# Patient Record
Sex: Male | Born: 1945
Health system: Southern US, Community
[De-identification: ages and names within clinical notes are randomized; demographics above are authoritative.]

## PROBLEM LIST (undated history)

## (undated) DIAGNOSIS — I351 Nonrheumatic aortic (valve) insufficiency: Secondary | ICD-10-CM

## (undated) DIAGNOSIS — E785 Hyperlipidemia, unspecified: Secondary | ICD-10-CM

## (undated) DIAGNOSIS — I77810 Thoracic aortic ectasia: Secondary | ICD-10-CM

## (undated) DIAGNOSIS — G250 Essential tremor: Secondary | ICD-10-CM

## (undated) DIAGNOSIS — I10 Essential (primary) hypertension: Secondary | ICD-10-CM

## (undated) DIAGNOSIS — R011 Cardiac murmur, unspecified: Secondary | ICD-10-CM

## (undated) HISTORY — PX: COLONOSCOPY: SHX174

## (undated) HISTORY — DX: Cardiac murmur, unspecified: R01.1

## (undated) HISTORY — DX: Thoracic aortic ectasia: I77.810

## (undated) HISTORY — DX: Hyperlipidemia, unspecified: E78.5

## (undated) HISTORY — PX: TUBAL LIGATION: SHX77

## (undated) HISTORY — PX: OTHER SURGICAL HISTORY: SHX169

## (undated) HISTORY — DX: Nonrheumatic aortic (valve) insufficiency: I35.1

## (undated) HISTORY — DX: Essential tremor: G25.0

---

## 2010-06-05 ENCOUNTER — Encounter: Payer: Self-pay | Admitting: Family Medicine

## 2010-06-05 ENCOUNTER — Ambulatory Visit (INDEPENDENT_AMBULATORY_CARE_PROVIDER_SITE_OTHER): Payer: BC Managed Care – PPO | Admitting: Family Medicine

## 2010-06-05 DIAGNOSIS — E7800 Pure hypercholesterolemia, unspecified: Secondary | ICD-10-CM

## 2010-06-05 DIAGNOSIS — L570 Actinic keratosis: Secondary | ICD-10-CM | POA: Insufficient documentation

## 2010-06-10 NOTE — Assessment & Plan Note (Signed)
Summary: NEW PT TO EST....   Vital Signs:  Patient profile:   65 year old male Height:      73 inches Weight:      213.50 pounds BMI:     28.27 Temp:     98.3 degrees F oral Pulse rate:   76 / minute Pulse rhythm:   regular BP sitting:   120 / 76  (left arm) Cuff size:   regular  Vitals Entered By: Benny Lennert CMA Duncan Dull) (June 05, 2010 10:56 AM)  History of Present Illness: Chief complaint new patient to be established   65 year old male:  2 weeks go and went to get new d eye doctor.   AK: noted on exam, several on forehead / face Effudex:  Hyperlipidemia: h/o being on Pravachol 20 mg, which he tolerated well. No problems, no SE.  REVIEW OF SYSTEMS GEN: No acute illnesses, no fever, chills, sweats. CV: No chest pain or SOB GI: No noted N or V Otherwise, pertinent positives and negatives are noted in the HPI.   GEN: WDWN, NAD, Non-toxic, A & O x 3 HEENT: Atraumatic, Normocephalic. Neck supple. No masses, No LAD. Ears and Nose: No external deformity. CV: RRR, No M/G/R. No JVD. No thrill. No extra heart sounds. PULM: CTA B, no wheezes, crackles, rhonchi. No retractions. No resp. distress. No accessory muscle use. EXTR: No c/c/e NEURO: Normal gait.  PSYCH: Normally interactive. Conversant. Not depressed or anxious appearing.  Calm demeanor.     Preventive Screening-Counseling & Management  Caffeine-Diet-Exercise     Does Patient Exercise: yes      Drug Use:  yes.    Allergies (verified): No Known Drug Allergies  Past History:  Past medical, surgical, family and social histories (including risk factors) reviewed, and no changes noted (except as noted below).  Past Medical History: HYPERCHOLESTEROLEMIA (ICD-272.0)    Past Surgical History: none  Family History: Reviewed history and no changes required. alzheimers Family History High cholesterol Parent with stroke  Social History: Reviewed history and no changes required. Retired -  Airline pilot Married Alcohol use-yes Drug use-yes Regular exercise-yes Former smokerDrug Use:  yes Does Patient Exercise:  yes   Impression & Recommendations:  Problem # 1:  HYPERCHOLESTEROLEMIA (ICD-272.0) Assessment New restart meds, recheck levels at CPX  His updated medication list for this problem includes:    Pravachol 20 Mg Tabs (Pravastatin sodium) .Marland Kitchen... 1 by mouth at bedtime  Problem # 2:  ACTINIC KERATOSIS, HEAD (ICD-702.0) Assessment: New efudex x  weeks  Complete Medication List: 1)  Glucosamine-chondroitin 250-200 Mg Caps (Glucosamine-chondroitin) .... One tablet daily 2)  Caltrate 600 1500 Mg Tabs (Calcium carbonate) .... Take one tablet daily 3)  Fluorouracil 5 % Crea (Fluorouracil) .... Apply two times a day to the affected  (for 6 weeks) 4)  Pravachol 20 Mg Tabs (Pravastatin sodium) .Marland Kitchen.. 1 by mouth at bedtime  Patient Instructions: 1)  USE EFUDEX FOR 6 WEEKS 2)  f/u when your medicare is active for "Welcome to Medicare Physical" Prescriptions: PRAVACHOL 20 MG TABS (PRAVASTATIN SODIUM) 1 by mouth at bedtime  #90 x 3   Entered and Authorized by:   Hannah Beat MD   Signed by:   Hannah Beat MD on 06/05/2010   Method used:   Print then Give to Patient   RxID:   [id number] FLUOROURACIL 5 % CREA (FLUOROURACIL) Apply two times a day to the affected  (for 6 weeks)  #1 tube x 1   Entered and Authorized  by:   Hannah Beat MD   Signed by:   Hannah Beat MD on 06/05/2010   Method used:   Print then Give to Patient   RxID:   [id number]    Orders Added: 1)  New Patient Level III [56213]    Current Allergies (reviewed today): No known allergies

## 2010-08-14 ENCOUNTER — Encounter: Payer: Self-pay | Admitting: Family Medicine

## 2010-10-23 ENCOUNTER — Other Ambulatory Visit (INDEPENDENT_AMBULATORY_CARE_PROVIDER_SITE_OTHER): Payer: Medicare Other | Admitting: Family Medicine

## 2010-10-23 DIAGNOSIS — Z79899 Other long term (current) drug therapy: Secondary | ICD-10-CM

## 2010-10-23 DIAGNOSIS — Z125 Encounter for screening for malignant neoplasm of prostate: Secondary | ICD-10-CM

## 2010-10-23 DIAGNOSIS — E785 Hyperlipidemia, unspecified: Secondary | ICD-10-CM

## 2010-10-23 LAB — CBC WITH DIFFERENTIAL/PLATELET
Basophils Absolute: 0 10*3/uL (ref 0.0–0.1)
Basophils Relative: 0.5 % (ref 0.0–3.0)
Eosinophils Absolute: 0.4 10*3/uL (ref 0.0–0.7)
Lymphocytes Relative: 27 % (ref 12.0–46.0)
MCHC: 32.8 g/dL (ref 30.0–36.0)
Monocytes Relative: 7.8 % (ref 3.0–12.0)
Neutrophils Relative %: 58.1 % (ref 43.0–77.0)
RBC: 4.37 Mil/uL (ref 4.22–5.81)

## 2010-10-23 LAB — BASIC METABOLIC PANEL
BUN: 13 mg/dL (ref 6–23)
CO2: 28 mEq/L (ref 19–32)
Chloride: 104 mEq/L (ref 96–112)
Creatinine, Ser: 1 mg/dL (ref 0.4–1.5)

## 2010-10-23 LAB — HEPATIC FUNCTION PANEL
Alkaline Phosphatase: 60 U/L (ref 39–117)
Bilirubin, Direct: 0.1 mg/dL (ref 0.0–0.3)
Total Protein: 7.3 g/dL (ref 6.0–8.3)

## 2010-10-23 LAB — LIPID PANEL
HDL: 39.7 mg/dL (ref >39.00)
Total CHOL/HDL Ratio: 5
VLDL: 22.2 mg/dL (ref 0.0–40.0)

## 2010-10-23 LAB — LDL CHOLESTEROL, DIRECT: Direct LDL: 139.1 mg/dL

## 2010-10-28 ENCOUNTER — Encounter: Payer: Self-pay | Admitting: Family Medicine

## 2010-10-31 ENCOUNTER — Ambulatory Visit (INDEPENDENT_AMBULATORY_CARE_PROVIDER_SITE_OTHER): Payer: Medicare Other | Admitting: Family Medicine

## 2010-10-31 ENCOUNTER — Encounter: Payer: Self-pay | Admitting: Family Medicine

## 2010-10-31 VITALS — BP 130/70 | HR 66 | Temp 97.9°F | Ht 73.0 in | Wt 208.1 lb

## 2010-10-31 DIAGNOSIS — E7800 Pure hypercholesterolemia, unspecified: Secondary | ICD-10-CM

## 2010-10-31 DIAGNOSIS — Z0000 Encounter for general adult medical examination without abnormal findings: Secondary | ICD-10-CM

## 2010-10-31 NOTE — Progress Notes (Signed)
Subjective:    Patient ID: Corey Williams, male    DOB: Jul 08, 1945, 65 y.o.   MRN: [id number]  HPI  Corey Williams, a 65 y.o. male presents today in the office for the following:  Medicare CPX and f/u medical problems.  Has a familial tremor. Brother has it really bad. Problem is most with her left hand. People have been notcinging it. Has been ongoing for about four years.   I have personally reviewed the Medicare Annual Wellness questionnaire and have noted 1. The patient's medical and social history 2. Their use of alcohol, tobacco or illicit drugs 3. Their current medications and supplements 4. The patient's functional ability including ADL's, fall risks, home safety risks and hearing or visual             impairment. 5. Diet and physical activities 6. Evidence for depression or mood disorders  The patients weight, height, BMI and visual acuity have been recorded in the chart I have made referrals, counseling and provided education to the patient based review of the above and I have provided the pt with a written personalized care plan for preventive services.  I have provided the patient with a copy of your personalized plan for preventive services. Instructed to take the time to review along with their updated medication list.  Lipids: Doing well, stable. Tolerating meds fine with no SE. Panel reviewed with patient.  Lipids:    Component Value Date/Time   CHOL 212* 10/23/2010 0929   TRIG 111.0 10/23/2010 0929   HDL 39.70 10/23/2010 0929   LDLDIRECT 139.1 10/23/2010 0929   VLDL 22.2 10/23/2010 0929   CHOLHDL 5 10/23/2010 0929    Lab Results  Component Value Date   ALT 19 10/23/2010   AST 18 10/23/2010   ALKPHOS 60 10/23/2010   BILITOT 1.1 10/23/2010   Preventative Health Maintenance Visit:  Health Maintenance Summary Reviewed and updated, unless pt declines services.  Tobacco History Reviewed. Alcohol: No concerns, no excessive use Exercise Habits: Some activity, rec at least 30 mins  5 times a week, walking or playing golf 5-6 days a week Drug Use: None Encouraged self-testicular check  Health Maintenance  Topic Date Due  . Tetanus/tdap  10/22/1964  . Colonoscopy  10/23/1995  . Zostavax  10/22/2005  . Pneumococcal Polysaccharide Vaccine Age 54 And Over  10/23/2010  . Influenza Vaccine  12/22/2010    Labs reviewed with the patient.   Lipids:    Component Value Date/Time   CHOL 212* 10/23/2010 0929   TRIG 111.0 10/23/2010 0929   HDL 39.70 10/23/2010 0929   LDLDIRECT 139.1 10/23/2010 0929   VLDL 22.2 10/23/2010 0929   CHOLHDL 5 10/23/2010 0929    CBC:    Component Value Date/Time   WBC 6.2 10/23/2010 0929   HGB 13.6 10/23/2010 0929   HCT 41.5 10/23/2010 0929   PLT 246.0 10/23/2010 0929   MCV 94.9 10/23/2010 0929   NEUTROABS 3.6 10/23/2010 0929   LYMPHSABS 1.7 10/23/2010 0929   MONOABS 0.5 10/23/2010 0929   EOSABS 0.4 10/23/2010 0929   BASOSABS 0.0 10/23/2010 0929    Basic Metabolic Panel:    Component Value Date/Time   NA 141 10/23/2010 0929   K 4.6 10/23/2010 0929   CL 104 10/23/2010 0929   CO2 28 10/23/2010 0929   BUN 13 10/23/2010 0929   CREATININE 1.0 10/23/2010 0929   GLUCOSE 93 10/23/2010 0929   CALCIUM 9.3 10/23/2010 0929    Lab Results  Component Value  Date   ALT 19 10/23/2010   AST 18 10/23/2010   ALKPHOS 60 10/23/2010   BILITOT 1.1 10/23/2010    Lab Results  Component Value Date   PSA 0.87 10/23/2010   The PMH, PSH, Social History, Family History, Medications, and allergies have been reviewed in [facility], and have been updated if relevant.  Review of Systems  General: Denies fever, chills, sweats. No significant weight loss. Eyes: Denies blurring,significant itching ENT: Denies earache, sore throat, and hoarseness. Cardiovascular: Denies chest pains, palpitations, dyspnea on exertion Respiratory: Denies cough, dyspnea at rest,wheeezing Breast: no concerns about lumps GI: Denies nausea, vomiting, diarrhea, constipation, change in bowel habits, abdominal pain, melena, hematochezia GU:  Denies penile discharge, ED, urinary flow / outflow problems. No STD concerns. Musculoskeletal: Denies back pain, joint pain Derm: Denies rash, itching Neuro: Denies  paresthesias, frequent falls, frequent headaches Psych: Denies depression, anxiety Endocrine: Denies cold intolerance, heat intolerance, polydipsia Heme: Denies enlarged lymph nodes Allergy: No hayfever     Objective:   Physical Exam   Physical Exam  Blood pressure 130/70, pulse 66, temperature 97.9 F (36.6 C), temperature source Oral, height 6\' 1"  (1.854 m), weight 208 lb 1.9 oz (94.403 kg), SpO2 98.00%.  PE: GEN: well developed, well nourished, no acute distress Eyes: conjunctiva and lids normal, PERRLA, EOMI ENT: TM clear, nares clear, oral exam WNL Neck: supple, no lymphadenopathy, no thyromegaly, no JVD Pulm: clear to auscultation and percussion, respiratory effort normal CV: regular rate and rhythm, S1-S2, no murmur, rub or gallop, no bruits, peripheral pulses normal and symmetric, no cyanosis, clubbing, edema or varicosities Chest: no scars, masses, no gynecomastia   GI: soft, non-tender; no hepatosplenomegaly, masses; active bowel sounds all quadrants GU: no hernia, testicular mass, penile discharge, priapism or prostate enlargement Lymph: no cervical, axillary or inguinal adenopathy MSK: gait normal, muscle tone and strength WNL, no joint swelling, effusions, discoloration, crepitus  SKIN: clear, good turgor, color WNL, no rashes, lesions, or ulcerations Neuro: normal mental status, normal strength, sensation, and motion Psych: alert; oriented to person, place and time, normally interactive and not anxious or depressed in appearance.     Assessment & Plan:   1. Routine general medical examination at a health care facility   2. HYPERCHOLESTEROLEMIA    The patient's preventative maintenance and recommended screening tests for an annual wellness exam were reviewed in full today. Brought up to date unless  services declined.  Counselled on the importance of diet, exercise, and its role in overall health and mortality. The patient's FH and SH was reviewed, including their home life, tobacco status, and drug and alcohol status.  Declines multiple: Zostavax, pneumovax, flu vaccine. Colonoscopy at this time -- he will check with his insurance and let us know if he is interested.

## 2011-07-29 ENCOUNTER — Other Ambulatory Visit: Payer: Self-pay | Admitting: *Deleted

## 2011-07-29 MED ORDER — PRAVASTATIN SODIUM 20 MG PO TABS: 20.0000 mg | ORAL_TABLET | Freq: Every day | ORAL | Status: DC

## 2011-11-06 ENCOUNTER — Other Ambulatory Visit: Payer: Self-pay | Admitting: Family Medicine

## 2011-11-06 DIAGNOSIS — R5381 Other malaise: Secondary | ICD-10-CM

## 2011-11-06 DIAGNOSIS — E785 Hyperlipidemia, unspecified: Secondary | ICD-10-CM

## 2011-11-06 DIAGNOSIS — Z125 Encounter for screening for malignant neoplasm of prostate: Secondary | ICD-10-CM

## 2011-11-09 ENCOUNTER — Other Ambulatory Visit (INDEPENDENT_AMBULATORY_CARE_PROVIDER_SITE_OTHER): Payer: Medicare Other

## 2011-11-09 DIAGNOSIS — Z125 Encounter for screening for malignant neoplasm of prostate: Secondary | ICD-10-CM

## 2011-11-09 DIAGNOSIS — R5381 Other malaise: Secondary | ICD-10-CM

## 2011-11-09 DIAGNOSIS — E785 Hyperlipidemia, unspecified: Secondary | ICD-10-CM

## 2011-11-09 DIAGNOSIS — R5383 Other fatigue: Secondary | ICD-10-CM

## 2011-11-09 LAB — CBC WITH DIFFERENTIAL/PLATELET
Basophils Relative: 0.6 % (ref 0.0–3.0)
Eosinophils Relative: 6.6 % — ABNORMAL HIGH (ref 0.0–5.0)
HCT: 40 % (ref 39.0–52.0)
Lymphs Abs: 1.6 10*3/uL (ref 0.7–4.0)
MCV: 95.5 fl (ref 78.0–100.0)
Monocytes Absolute: 0.6 10*3/uL (ref 0.1–1.0)
Monocytes Relative: 9.3 % (ref 3.0–12.0)
RBC: 4.18 Mil/uL — ABNORMAL LOW (ref 4.22–5.81)
WBC: 6 10*3/uL (ref 4.5–10.5)

## 2011-11-09 LAB — LIPID PANEL
LDL Cholesterol: 124 mg/dL — ABNORMAL HIGH (ref 0–99)
Total CHOL/HDL Ratio: 5
Triglycerides: 72 mg/dL (ref 0.0–149.0)
VLDL: 14.4 mg/dL (ref 0.0–40.0)

## 2011-11-09 LAB — BASIC METABOLIC PANEL
CO2: 29 mEq/L (ref 19–32)
Chloride: 105 mEq/L (ref 96–112)
Creatinine, Ser: 0.9 mg/dL (ref 0.4–1.5)
Potassium: 4.6 mEq/L (ref 3.5–5.1)

## 2011-11-09 LAB — HEPATIC FUNCTION PANEL
Albumin: 4 g/dL (ref 3.5–5.2)
Alkaline Phosphatase: 61 U/L (ref 39–117)
Bilirubin, Direct: 0.2 mg/dL (ref 0.0–0.3)

## 2011-11-16 ENCOUNTER — Encounter: Payer: Self-pay | Admitting: Family Medicine

## 2011-11-16 ENCOUNTER — Ambulatory Visit (INDEPENDENT_AMBULATORY_CARE_PROVIDER_SITE_OTHER): Payer: Medicare Other | Admitting: Family Medicine

## 2011-11-16 VITALS — BP 148/68 | HR 68 | Temp 97.5°F | Ht 73.0 in | Wt 211.0 lb

## 2011-11-16 DIAGNOSIS — Z0000 Encounter for general adult medical examination without abnormal findings: Secondary | ICD-10-CM

## 2011-11-16 DIAGNOSIS — L570 Actinic keratosis: Secondary | ICD-10-CM

## 2011-11-16 MED ORDER — FLUOROURACIL 5 % EX CREA: TOPICAL_CREAM | Freq: Two times a day (BID) | CUTANEOUS | Status: AC

## 2011-11-16 NOTE — Progress Notes (Signed)
Nature conservation officer at [facility] [address] [place] [address] Phone: [phone] Fax: [phone]  Date:  11/16/2011   Name:  Corey Williams   DOB:  09-01-1945   MRN:  [id number] Gender: male Age: 66 y.o.  PCP:  Hannah Beat, MD    Chief Complaint: Annual Exam   History of Present Illness:  Corey Williams is a 66 y.o. pleasant patient who presents with the following:  Back of ear, dead skin? Umbilical hernia.   Preventative Health Maintenance Visit:  Health Maintenance Summary Reviewed and updated, unless pt declines services.  Tobacco History Reviewed. Alcohol: No concerns, no excessive use about 3 beers a week Exercise Habits: walks 3 miles a day STD concerns: no risk or activity to increase risk Drug Use: None Encouraged self-testicular check  Health Maintenance  Topic Date Due  . Tetanus/tdap  10/22/1964  . Influenza Vaccine  12/22/2011  . Colonoscopy  10/30/2020  . Pneumococcal Polysaccharide Vaccine Age 66 And Over  Addressed  . Zostavax  Addressed    Labs reviewed with the patient.  Results for orders placed in visit on 11/09/11  CBC WITH DIFFERENTIAL      Component Value Range   WBC 6.0  4.5 - 10.5 K/uL   RBC 4.18 (*) 4.22 - 5.81 Mil/uL   Hemoglobin 13.0  13.0 - 17.0 g/dL   HCT 01.0  27.2 - 53.6 %   MCV 95.5  78.0 - 100.0 fl   MCHC 32.6  30.0 - 36.0 g/dL   RDW 64.4  03.4 - 74.2 %   Platelets 226.0  150.0 - 400.0 K/uL   Neutrophils Relative 56.1  43.0 - 77.0 %   Lymphocytes Relative 27.4  12.0 - 46.0 %   Monocytes Relative 9.3  3.0 - 12.0 %   Eosinophils Relative 6.6 (*) 0.0 - 5.0 %   Basophils Relative 0.6  0.0 - 3.0 %   Neutro Abs 3.4  1.4 - 7.7 K/uL   Lymphs Abs 1.6  0.7 - 4.0 K/uL   Monocytes Absolute 0.6  0.1 - 1.0 K/uL   Eosinophils Absolute 0.4  0.0 - 0.7 K/uL   Basophils Absolute 0.0  0.0 - 0.1 K/uL  LIPID PANEL      Component Value Range   Cholesterol 178  0 - 200 mg/dL   Triglycerides 59.5  0.0 - 149.0 mg/dL   HDL 63.87   >56.43 mg/dL   VLDL 32.9  0.0 - 51.8 mg/dL   LDL Cholesterol 841 (*) 0 - 99 mg/dL   Total CHOL/HDL Ratio 5    HEPATIC FUNCTION PANEL      Component Value Range   Total Bilirubin 1.0  0.3 - 1.2 mg/dL   Bilirubin, Direct 0.2  0.0 - 0.3 mg/dL   Alkaline Phosphatase 61  39 - 117 U/L   AST 20  0 - 37 U/L   ALT 15  0 - 53 U/L   Total Protein 7.3  6.0 - 8.3 g/dL   Albumin 4.0  3.5 - 5.2 g/dL  BASIC METABOLIC PANEL      Component Value Range   Sodium 140  135 - 145 mEq/L   Potassium 4.6  3.5 - 5.1 mEq/L   Chloride 105  96 - 112 mEq/L   CO2 29  19 - 32 mEq/L   Glucose, Bld 83  70 - 99 mg/dL   BUN 14  6 - 23 mg/dL   Creatinine, Ser 0.9  0.4 - 1.5 mg/dL  Calcium 9.3  8.4 - 10.5 mg/dL   GFR 16.10  >96.04 mL/min  PSA, MEDICARE      Component Value Range   PSA 0.69  0.10 - 4.00 ng/ml     Patient Active Problem List  Diagnosis  . HYPERCHOLESTEROLEMIA  . ACTINIC KERATOSIS, HEAD    Past Medical History  Diagnosis Date  . Hyperlipidemia     No past surgical history on file.  History  Substance Use Topics  . Smoking status: Former Games developer  . Smokeless tobacco: Never Used  . Alcohol Use: Yes    No family history on file.  No Known Allergies  Medication list has been reviewed and updated.  Current Outpatient Prescriptions on File Prior to Visit  Medication Sig Dispense Refill  . aspirin 81 MG tablet Take 81 mg by mouth daily.        . Multiple Vitamin (MULTIVITAMIN) tablet Take 1 tablet by mouth daily.        . pravastatin (PRAVACHOL) 20 MG tablet Take 1 tablet (20 mg total) by mouth daily.  90 tablet  1    Review of Systems:   General: Denies fever, chills, sweats. No significant weight loss. Eyes: Denies blurring,significant itching ENT: Denies earache, sore throat, and hoarseness. Cardiovascular: Denies chest pains, palpitations, dyspnea on exertion Respiratory: Denies cough, dyspnea at rest,wheeezing Breast: no concerns about lumps GI: Denies nausea, vomiting,  diarrhea, constipation, change in bowel habits, abdominal pain, melena, hematochezia GU: Denies penile discharge, ED, urinary flow / outflow problems. No STD concerns. Musculoskeletal: Denies back pain, joint pain Derm: above Neuro: Denies  paresthesias, frequent falls, frequent headaches Psych: Denies depression, anxiety Endocrine: Denies cold intolerance, heat intolerance, polydipsia Heme: Denies enlarged lymph nodes Allergy: No hayfever   Physical Examination: Filed Vitals:   11/16/11 1448  BP: 148/68  Pulse: 68  Temp: 97.5 F (36.4 C)   Filed Vitals:   11/16/11 1448  Height: 6\' 1"  (1.854 m)  Weight: 211 lb (95.709 kg)   Body mass index is 27.84 kg/(m^2). Ideal Body Weight: Weight in (lb) to have BMI = 25: 189.1    Wt Readings from Last 3 Encounters:  11/16/11 211 lb (95.709 kg)  10/31/10 208 lb 1.9 oz (94.403 kg)  06/05/10 213 lb 8 oz (96.843 kg)    GEN: well developed, well nourished, no acute distress Eyes: conjunctiva and lids normal, PERRLA, EOMI ENT: TM clear, nares clear, oral exam WNL Neck: supple, no lymphadenopathy, no thyromegaly, no JVD Pulm: clear to auscultation and percussion, respiratory effort normal CV: regular rate and rhythm, S1-S2, no murmur, rub or gallop, no bruits, peripheral pulses normal and symmetric, no cyanosis, clubbing, edema or varicosities Chest: no scars, masses GI: soft, non-tender; no hepatosplenomegaly, masses; active bowel sounds all quadrants, umbilical hernia GU: no hernia, testicular mass, penile discharge, or prostate enlargement Lymph: no cervical, axillary or inguinal adenopathy MSK: gait normal, muscle tone and strength WNL, no joint swelling, effusions, discoloration, crepitus  SKIN: lesion on L top of ear, scaly, slightly pearly Neuro: normal mental status, normal strength, sensation, and motion Psych: alert; oriented to person, place and time, normally interactive and not anxious or depressed in  appearance.   Assessment and Plan:  1. Routine general medical examination at a health care facility   2. Actinic keratosis    The patient's preventative maintenance and recommended screening tests for an annual wellness exam were reviewed in full today. Brought up to date unless services declined.  Counselled on the importance of diet,  exercise, and its role in overall health and mortality. The patient's FH and SH was reviewed, including their home life, tobacco status, and drug and alcohol status.    I have personally reviewed the Medicare Annual Wellness questionnaire and have noted 1. The patient's medical and social history 2. Their use of alcohol, tobacco or illicit drugs 3. Their current medications and supplements 4. The patient's functional ability including ADL's, fall risks, home safety risks and hearing or visual             impairment. 5. Diet and physical activities 6. Evidence for depression or mood disorders  The patients weight, height, BMI and visual acuity have been recorded in the chart I have made referrals, counseling and provided education to the patient based review of the above and I have provided the pt with a written personalized care plan for preventive services.  I have provided the patient with a copy of your personalized plan for preventive services. Instructed to take the time to review along with their updated medication list.   Orders Today:  No orders of the defined types were placed in this encounter.    Medications Today: (Includes new updates added during medication reconciliation) Meds ordered this encounter  Medications  . Calcium Carbonate (CALCIUM 600 PO)    Sig: Take 1 tablet by mouth daily.  . Glucosamine 750 MG TABS    Sig: Take 2 tablets by mouth daily.  . fluorouracil (EFUDEX) 5 % cream    Sig: Apply topically 2 (two) times daily.    Dispense:  40 g    Refill:  1    Medications Discontinued: Medications Discontinued During  This Encounter  Medication Reason  . Calcium Carbonate (CALTRATE 600) 1500 MG TABS Change in therapy  . Glucosamine-Chondroitin (GLUCOSAMINE CHONDR COMPLEX) 500-400 MG CAPS Change in therapy     Hannah Beat, MD

## 2012-01-25 ENCOUNTER — Encounter: Payer: Self-pay | Admitting: Gastroenterology

## 2012-01-25 ENCOUNTER — Telehealth: Payer: Self-pay

## 2012-01-25 DIAGNOSIS — Z1211 Encounter for screening for malignant neoplasm of colon: Secondary | ICD-10-CM

## 2012-01-25 NOTE — Telephone Encounter (Signed)
Pt request colonoscopy referral; pt said Dr Patsy Lager had recommended pt to have colonoscopy.Please advise.

## 2012-01-25 NOTE — Telephone Encounter (Signed)
done

## 2012-02-02 ENCOUNTER — Ambulatory Visit (AMBULATORY_SURGERY_CENTER): Payer: Medicare Other | Admitting: *Deleted

## 2012-02-02 VITALS — Ht 73.0 in | Wt 220.9 lb

## 2012-02-02 DIAGNOSIS — Z1211 Encounter for screening for malignant neoplasm of colon: Secondary | ICD-10-CM

## 2012-02-02 MED ORDER — MOVIPREP 100 G PO SOLR: ORAL | Status: DC

## 2012-02-15 ENCOUNTER — Ambulatory Visit (AMBULATORY_SURGERY_CENTER): Payer: Medicare Other | Admitting: Gastroenterology

## 2012-02-15 ENCOUNTER — Encounter: Payer: Self-pay | Admitting: Gastroenterology

## 2012-02-15 VITALS — BP 132/91 | HR 59 | Temp 96.7°F | Resp 14 | Ht 73.0 in | Wt 220.0 lb

## 2012-02-15 DIAGNOSIS — Z1211 Encounter for screening for malignant neoplasm of colon: Secondary | ICD-10-CM

## 2012-02-15 MED ORDER — SODIUM CHLORIDE 0.9 % IV SOLN: 500.0000 mL | INTRAVENOUS | Status: DC

## 2012-02-15 NOTE — Progress Notes (Signed)
1438 a/ox3 pleased with MAC, report to [facility]

## 2012-02-15 NOTE — Op Note (Signed)
[facility] [address] [place], [address]   COLONOSCOPY PROCEDURE REPORT  PATIENT: Corey Williams, Corey Williams  MR#: [id number] BIRTHDATE: [birth year] , 66  yrs. old GENDER: Male ENDOSCOPIST: Mardella Layman, MD, [facility] REFERRED BY: PROCEDURE DATE:  02/15/2012 PROCEDURE:   Colonoscopy, screening ASA CLASS:   Class I INDICATIONS:Average risk patient for colon cancer. MEDICATIONS: propofol (Diprivan) 200mg  IV  DESCRIPTION OF PROCEDURE:   After the risks and benefits and of the procedure were explained, informed consent was obtained.  A digital rectal exam revealed no abnormalities of the rectum.    The LB [id number]  endoscope was introduced through the anus and advanced to the cecum, which was identified by both the appendix and ileocecal valve .  The quality of the prep was good, using MoviPrep .  The instrument was then slowly withdrawn as the colon was fully examined.     COLON FINDINGS: Diverticulosis was noted in the descending colon and sigmoid colon.     Retroflexed views revealed no abnormalities. The scope was then withdrawn from the patient and the procedure completed.  COMPLICATIONS: There were no complications. ENDOSCOPIC IMPRESSION: Diverticulosis was noted in the descending colon and sigmoid colon  RECOMMENDATIONS: 1.  High fiber diet 2.  You should continue to follow colorectal cancer screening guidelines for "routine risk" patients with a repeat colonoscopy in 10 years.  There is no need for FOBT (stool) testing for at least 5 years.   REPEAT EXAM:  cc:  _______________________________ eSignedMardella Layman, MD, [facility] 02/15/2012 2:38 PM

## 2012-02-15 NOTE — Patient Instructions (Signed)
YOU HAD AN ENDOSCOPIC PROCEDURE TODAY AT THE [facility]: Refer to the procedure report that was given to you for any specific questions about what was found during the examination.  If the procedure report does not answer your questions, please call your gastroenterologist to clarify.  If you requested that your care partner not be given the details of your procedure findings, then the procedure report has been included in a sealed envelope for you to review at your convenience later.  YOU SHOULD EXPECT: Some feelings of bloating in the abdomen. Passage of more gas than usual.  Walking can help get rid of the air that was put into your GI tract during the procedure and reduce the bloating. If you had a lower endoscopy (such as a colonoscopy or flexible sigmoidoscopy) you may notice spotting of blood in your stool or on the toilet paper. If you underwent a bowel prep for your procedure, then you may not have a normal bowel movement for a few days.  DIET: Your first meal following the procedure should be a light meal and then it is ok to progress to your normal diet.  A half-sandwich or bowl of soup is an example of a good first meal.  Heavy or fried foods are harder to digest and may make you feel nauseous or bloated.  Likewise meals heavy in dairy and vegetables can cause extra gas to form and this can also increase the bloating.  Drink plenty of fluids but you should avoid alcoholic beverages for 24 hours.  ACTIVITY: Your care partner should take you home directly after the procedure.  You should plan to take it easy, moving slowly for the rest of the day.  You can resume normal activity the day after the procedure however you should NOT DRIVE or use heavy machinery for 24 hours (because of the sedation medicines used during the test).    SYMPTOMS TO REPORT IMMEDIATELY: A gastroenterologist can be reached at any hour.  During normal business hours, 8:30 AM to 5:00 PM Monday through Friday,  call [phone].  After hours and on weekends, please call the GI answering service at [phone] who will take a message and have the physician on call contact you.   Following lower endoscopy (colonoscopy or flexible sigmoidoscopy):  Excessive amounts of blood in the stool  Significant tenderness or worsening of abdominal pains  Swelling of the abdomen that is new, acute  Fever of 100F or higher   FOLLOW UP: If any biopsies were taken you will be contacted by phone or by letter within the next 1-3 weeks.  Call your gastroenterologist if you have not heard about the biopsies in 3 weeks.  Our staff will call the home number listed on your records the next business day following your procedure to check on you and address any questions or concerns that you may have at that time regarding the information given to you following your procedure. This is a courtesy call and so if there is no answer at the home number and we have not heard from you through the emergency physician on call, we will assume that you have returned to your regular daily activities without incident.  SIGNATURES/CONFIDENTIALITY: You and/or your care partner have signed paperwork which will be entered into your electronic medical record.  These signatures attest to the fact that that the information above on your After Visit Summary has been reviewed and is understood.  Full responsibility of the confidentiality of   this discharge information lies with you and/or your care-partner.    INFORMATION ON DIVERTICULOSIS & HIGH FIBER DIET GIVEN TO YOU TODAY 

## 2012-02-15 NOTE — Progress Notes (Signed)
Patient did not experience any of the following events: a burn prior to discharge; a fall within the facility; wrong site/side/patient/procedure/implant event; or a hospital transfer or hospital admission upon discharge from the facility. (G8907) Patient did not have preoperative order for IV antibiotic SSI prophylaxis. (G8918)  

## 2012-02-16 ENCOUNTER — Telehealth: Payer: Self-pay

## 2012-02-16 NOTE — Telephone Encounter (Signed)
  Follow up Call-  Call back number 02/15/2012  Post procedure Call Back phone  # [phone]  Permission to leave phone message Yes     Patient questions:  Do you have a fever, pain , or abdominal swelling? no Pain Score  0 *  Have you tolerated food without any problems? yes  Have you been able to return to your normal activities? yes  Do you have any questions about your discharge instructions: Diet   no Medications  no Follow up visit  no  Do you have questions or concerns about your Care? no  Actions: * If pain score is 4 or above: No action needed, pain <4.

## 2012-02-25 ENCOUNTER — Other Ambulatory Visit: Payer: Self-pay | Admitting: Family Medicine

## 2012-04-13 ENCOUNTER — Ambulatory Visit (INDEPENDENT_AMBULATORY_CARE_PROVIDER_SITE_OTHER)
Admission: RE | Admit: 2012-04-13 | Discharge: 2012-04-13 | Disposition: A | Discharge status: Home / Self Care | Payer: Medicare Other | Source: Ambulatory Visit | Attending: Family Medicine | Admitting: Family Medicine

## 2012-04-13 ENCOUNTER — Ambulatory Visit (INDEPENDENT_AMBULATORY_CARE_PROVIDER_SITE_OTHER): Payer: Medicare Other | Admitting: Family Medicine

## 2012-04-13 ENCOUNTER — Encounter: Payer: Self-pay | Admitting: Family Medicine

## 2012-04-13 VITALS — BP 140/92 | HR 64 | Temp 98.0°F | Ht 73.0 in | Wt 216.8 lb

## 2012-04-13 DIAGNOSIS — M79659 Pain in unspecified thigh: Secondary | ICD-10-CM

## 2012-04-13 DIAGNOSIS — M79609 Pain in unspecified limb: Secondary | ICD-10-CM

## 2012-04-13 MED ORDER — CYCLOBENZAPRINE HCL 10 MG PO TABS: 10.0000 mg | ORAL_TABLET | Freq: Every day | ORAL | Status: DC

## 2012-04-13 NOTE — Progress Notes (Signed)
Nature conservation officer at [facility] [address] [place] [address] Phone: [phone] Fax: [phone]  Date:  04/13/2012   Name:  Corey Williams   DOB:  January 20, 1946   MRN:  [id number] Gender: male Age: 67 y.o.  Primary Physician:  Hannah Beat, MD  Evaluating MD: Hannah Beat, MD   Chief Complaint: Leg Pain   History of Present Illness:  KAYVON MO is a 67 y.o. pleasant patient who presents with the following:  Leg pain:  Walks on most days, then Friday, pain in the left distal thigh.  Presents with 5 days history of distal anterior thigh pain without any known preceding injury. Stopped his statin over the weekend, but this has not helped. Some difficulty sleeping at night, as well.  Patient Active Problem List  Diagnosis  . HYPERCHOLESTEROLEMIA  . ACTINIC KERATOSIS, HEAD    Past Medical History  Diagnosis Date  . Hyperlipidemia     Past Surgical History  Procedure Date  . No prior surgery     History  Substance Use Topics  . Smoking status: Former Smoker    Quit date: 02/01/1974  . Smokeless tobacco: Never Used  . Alcohol Use: 3.0 oz/week    5 Cans of beer per week    Family History  Problem Relation Age of Onset  . Colon cancer Neg Hx   . Stomach cancer Neg Hx     No Known Allergies  Medication list has been reviewed and updated.  Outpatient Prescriptions Prior to Visit  Medication Sig Dispense Refill  . aspirin 81 MG tablet Take 81 mg by mouth daily.        . Calcium Carbonate (CALCIUM 600 PO) Take 1 tablet by mouth daily.      . fluorouracil (EFUDEX) 5 % cream Apply topically 2 (two) times daily.  40 g  1  . Glucosamine 750 MG TABS Take 2 tablets by mouth daily.      . Multiple Vitamin (MULTIVITAMIN) tablet Take 1 tablet by mouth daily.        . pravastatin (PRAVACHOL) 20 MG tablet TAKE ONE TABLET BY MOUTH EVERY DAY  90 tablet  0   Last reviewed on 04/13/2012  8:03 AM by Hannah Beat, MD  Review of Systems:   GEN: No  acute illnesses, no fevers, chills. GI: No n/v/d, eating normally Pulm: No SOB Interactive and getting along well at home.  Otherwise, ROS is as per the HPI.   Physical Examination: BP 140/92  Pulse 64  Temp 98 F (36.7 C) (Oral)  Ht 6\' 1"  (1.854 m)  Wt 216 lb 12 oz (98.317 kg)  BMI 28.60 kg/m2  SpO2 97%  Ideal Body Weight: Weight in (lb) to have BMI = 25: 189.1    GEN: WDWN, NAD, Non-toxic, Alert & Oriented x 3 HEENT: Atraumatic, Normocephalic.  Ears and Nose: No external deformity. EXTR: No clubbing/cyanosis/edema NEURO: Normal gait.  PSYCH: Normally interactive. Conversant. Not depressed or anxious appearing.  Calm demeanor.   Knee:  B Gait: Normal heel toe pattern ROM: full Effusion: neg Echymosis or edema: none Patellar tendon NT Painful PLICA: neg Patellar grind: negative Medial and lateral patellar facet loading: negative medial and lateral joint lines:NT Mcmurray's neg Flexion-pinch neg Varus and valgus stress: stable Lachman: neg Ant and Post drawer: neg Hip abduction, IR, ER: WNL Hip flexion str: 5/5 Hip abd: 5/5 Quad: 5/5 VMO atrophy:No Hamstring concentric and eccentric: 5/5   Assessment and Plan:  1. Thigh pain  DG Femur Left   Knee and thigh exam relatively benign. No occult injury. More likely muscular given location, maybe from overuse from walking. Check thigh film to ensure normal distal femur. Flexeril at night and 600 mg Ibuprofen q 6 hours  [facility] knee rehab reviewed  Orders Today:  Orders Placed This Encounter  Procedures  . DG Femur Left    Standing Status: Future     Number of Occurrences: 1     Standing Expiration Date: 06/11/2013    Order Specific Question:  Reason for exam:    Answer:  thigh pain distally    Order Specific Question:  Preferred imaging location?    Answer:  [facility]    Updated Medication List: (Includes new medications, updates to list, dose adjustments) Meds ordered this encounter  Medications   . cyclobenzaprine (FLEXERIL) 10 MG tablet    Sig: Take 1 tablet (10 mg total) by mouth at bedtime.    Dispense:  30 tablet    Refill:  1    Medications Discontinued: There are no discontinued medications.   Signed, Elpidio Galea. Skarleth Delmonico, MD 04/13/2012 8:03 AM

## 2012-06-13 ENCOUNTER — Other Ambulatory Visit: Payer: Self-pay | Admitting: Family Medicine

## 2012-09-18 ENCOUNTER — Other Ambulatory Visit: Payer: Self-pay | Admitting: Family Medicine

## 2012-12-19 ENCOUNTER — Other Ambulatory Visit (INDEPENDENT_AMBULATORY_CARE_PROVIDER_SITE_OTHER): Payer: Medicare Other

## 2012-12-19 DIAGNOSIS — E785 Hyperlipidemia, unspecified: Secondary | ICD-10-CM

## 2012-12-19 DIAGNOSIS — Z125 Encounter for screening for malignant neoplasm of prostate: Secondary | ICD-10-CM

## 2012-12-19 DIAGNOSIS — E7800 Pure hypercholesterolemia, unspecified: Secondary | ICD-10-CM

## 2012-12-19 DIAGNOSIS — Z79899 Other long term (current) drug therapy: Secondary | ICD-10-CM

## 2012-12-19 LAB — HEPATIC FUNCTION PANEL
ALT: 19 U/L (ref 0–53)
AST: 19 U/L (ref 0–37)
Albumin: 4.2 g/dL (ref 3.5–5.2)
Alkaline Phosphatase: 59 U/L (ref 39–117)
Total Bilirubin: 1 mg/dL (ref 0.3–1.2)

## 2012-12-19 LAB — CBC WITH DIFFERENTIAL/PLATELET
Eosinophils Absolute: 0.5 10*3/uL (ref 0.0–0.7)
Eosinophils Relative: 7.5 % — ABNORMAL HIGH (ref 0.0–5.0)
Lymphocytes Relative: 19.8 % (ref 12.0–46.0)
Lymphs Abs: 1.3 10*3/uL (ref 0.7–4.0)
MCHC: 33.7 g/dL (ref 30.0–36.0)
MCV: 93.9 fl (ref 78.0–100.0)
Monocytes Absolute: 0.6 10*3/uL (ref 0.1–1.0)
Neutrophils Relative %: 62.9 % (ref 43.0–77.0)
Platelets: 226 10*3/uL (ref 150.0–400.0)
RDW: 13.4 % (ref 11.5–14.6)
WBC: 6.7 10*3/uL (ref 4.5–10.5)

## 2012-12-19 LAB — BASIC METABOLIC PANEL
Calcium: 9.5 mg/dL (ref 8.4–10.5)
Chloride: 103 mEq/L (ref 96–112)
GFR: 88.28 mL/min (ref >60.00)
Potassium: 4.3 mEq/L (ref 3.5–5.1)
Sodium: 139 mEq/L (ref 135–145)

## 2012-12-19 LAB — LIPID PANEL
Cholesterol: 177 mg/dL (ref 0–200)
HDL: 38.5 mg/dL — ABNORMAL LOW (ref >39.00)
Triglycerides: 92 mg/dL (ref 0.0–149.0)
VLDL: 18.4 mg/dL (ref 0.0–40.0)

## 2012-12-22 ENCOUNTER — Ambulatory Visit (INDEPENDENT_AMBULATORY_CARE_PROVIDER_SITE_OTHER): Payer: Medicare Other | Admitting: Family Medicine

## 2012-12-22 ENCOUNTER — Encounter: Payer: Self-pay | Admitting: Family Medicine

## 2012-12-22 VITALS — BP 128/62 | HR 67 | Temp 98.5°F | Ht 72.0 in | Wt 209.8 lb

## 2012-12-22 DIAGNOSIS — Z23 Encounter for immunization: Secondary | ICD-10-CM

## 2012-12-22 DIAGNOSIS — Z0000 Encounter for general adult medical examination without abnormal findings: Secondary | ICD-10-CM

## 2012-12-22 MED ORDER — PRAVASTATIN SODIUM 20 MG PO TABS: ORAL_TABLET | ORAL | Status: DC

## 2012-12-22 NOTE — Progress Notes (Signed)
Nature conservation officer at [facility] [address] [place] [address] Phone: [phone] Fax: [phone]  Date:  12/22/2012   Name:  Corey Williams   DOB:  Jun 11, 1945   MRN:  [id number] Gender: male Age: 67 y.o.  Primary Physician:  Hannah Beat, MD  Evaluating MD: Hannah Beat, MD   Chief Complaint: Medicare Wellness   History of Present Illness:  Corey Williams is a 67 y.o. pleasant patient who presents with the following:   CPX:  Tremor worse, familial. Has been getting worse and it has bothered him.   Preventative Health Maintenance Visit:  Health Maintenance Summary Reviewed and updated, unless pt declines services.  Tobacco History Reviewed. Alcohol: No concerns, no excessive use Exercise Habits: Some activity, rec at least 30 mins 5 times a week - walking, has more problems with playing golf. STD concerns: no risk or activity to increase risk Drug Use: None Encouraged self-testicular check  Health Maintenance  Topic Date Due  . Influenza Vaccine  12/23/2013  . Tetanus/tdap  12/23/2013  . Colonoscopy  02/14/2022  . Pneumococcal Polysaccharide Vaccine Age 70 And Over  Addressed  . Zostavax  Addressed    Labs reviewed with the patient.  Results for orders placed in visit on 12/19/12  CBC WITH DIFFERENTIAL      Result Value Range   WBC 6.7  4.5 - 10.5 K/uL   RBC 4.34  4.22 - 5.81 Mil/uL   Hemoglobin 13.7  13.0 - 17.0 g/dL   HCT 96.2  95.2 - 84.1 %   MCV 93.9  78.0 - 100.0 fl   MCHC 33.7  30.0 - 36.0 g/dL   RDW 32.4  40.1 - 02.7 %   Platelets 226.0  150.0 - 400.0 K/uL   Neutrophils Relative % 62.9  43.0 - 77.0 %   Lymphocytes Relative 19.8  12.0 - 46.0 %   Monocytes Relative 9.3  3.0 - 12.0 %   Eosinophils Relative 7.5 (*) 0.0 - 5.0 %   Basophils Relative 0.5  0.0 - 3.0 %   Neutro Abs 4.2  1.4 - 7.7 K/uL   Lymphs Abs 1.3  0.7 - 4.0 K/uL   Monocytes Absolute 0.6  0.1 - 1.0 K/uL   Eosinophils Absolute 0.5  0.0 - 0.7 K/uL   Basophils  Absolute 0.0  0.0 - 0.1 K/uL  BASIC METABOLIC PANEL      Result Value Range   Sodium 139  135 - 145 mEq/L   Potassium 4.3  3.5 - 5.1 mEq/L   Chloride 103  96 - 112 mEq/L   CO2 31  19 - 32 mEq/L   Glucose, Bld 86  70 - 99 mg/dL   BUN 14  6 - 23 mg/dL   Creatinine, Ser 0.9  0.4 - 1.5 mg/dL   Calcium 9.5  8.4 - 25.3 mg/dL   GFR 66.44  >03.47 mL/min  LIPID PANEL      Result Value Range   Cholesterol 177  0 - 200 mg/dL   Triglycerides 42.5  0.0 - 149.0 mg/dL   HDL 95.63 (*) >87.56 mg/dL   VLDL 43.3  0.0 - 29.5 mg/dL   LDL Cholesterol 188 (*) 0 - 99 mg/dL   Total CHOL/HDL Ratio 5    PSA, MEDICARE      Result Value Range   PSA 0.78  0.10 - 4.00 ng/ml  HEPATIC FUNCTION PANEL      Result Value Range   Total Bilirubin 1.0  0.3 -  1.2 mg/dL   Bilirubin, Direct 0.2  0.0 - 0.3 mg/dL   Alkaline Phosphatase 59  39 - 117 U/L   AST 19  0 - 37 U/L   ALT 19  0 - 53 U/L   Total Protein 7.6  6.0 - 8.3 g/dL   Albumin 4.2  3.5 - 5.2 g/dL     Patient Active Problem List   Diagnosis Date Noted  . HYPERCHOLESTEROLEMIA 06/05/2010  . ACTINIC KERATOSIS, HEAD 06/05/2010    Past Medical History  Diagnosis Date  . Hyperlipidemia     Past Surgical History  Procedure Laterality Date  . No prior surgery      History   Social History  . Marital Status: Married    Spouse Name: N/A    Number of Children: N/A  . Years of Education: N/A   Occupational History  . sales    Social History Main Topics  . Smoking status: Former Smoker    Quit date: 02/01/1974  . Smokeless tobacco: Never Used  . Alcohol Use: 3.0 oz/week    5 Cans of beer per week  . Drug Use: No  . Sexual Activity: Not on file   Other Topics Concern  . Not on file   Social History Narrative   Regular exercise--yes    Family History  Problem Relation Age of Onset  . Colon cancer Neg Hx   . Stomach cancer Neg Hx     No Known Allergies  Medication list has been reviewed and updated.  Outpatient Prescriptions  Prior to Visit  Medication Sig Dispense Refill  . aspirin 81 MG tablet Take 81 mg by mouth daily.        . Calcium Carbonate (CALCIUM 600 PO) Take 1 tablet by mouth daily.      . cyclobenzaprine (FLEXERIL) 10 MG tablet Take 1 tablet (10 mg total) by mouth at bedtime.  30 tablet  1  . Glucosamine 750 MG TABS Take 2 tablets by mouth daily.      . Multiple Vitamin (MULTIVITAMIN) tablet Take 1 tablet by mouth daily.        . pravastatin (PRAVACHOL) 20 MG tablet TAKE ONE TABLET BY MOUTH ONCE DAILY  90 tablet  0   No facility-administered medications prior to visit.    Review of Systems:   General: Denies fever, chills, sweats. No significant weight loss. Eyes: Denies blurring,significant itching ENT: Denies earache, sore throat, and hoarseness. Cardiovascular: Denies chest pains, palpitations, dyspnea on exertion Respiratory: Denies cough, dyspnea at rest,wheeezing Breast: no concerns about lumps GI: Denies nausea, vomiting, diarrhea, constipation, change in bowel habits, abdominal pain, melena, hematochezia GU: Denies penile discharge, ED, urinary flow / outflow problems. No STD concerns. Musculoskeletal: Denies back pain, joint pain Derm: Denies rash, itching Neuro: Denies  paresthesias, frequent falls, frequent headaches - tremor noted Psych: Denies depression, anxiety Endocrine: Denies cold intolerance, heat intolerance, polydipsia Heme: Denies enlarged lymph nodes Allergy: No hayfever   Physical Examination: BP 128/62  Pulse 67  Temp(Src) 98.5 F (36.9 C) (Oral)  Ht 6' (1.829 m)  Wt 209 lb 12 oz (95.142 kg)  BMI 28.44 kg/m2  Ideal Body Weight: Weight in (lb) to have BMI = 25: 183.9   Wt Readings from Last 3 Encounters:  12/22/12 209 lb 12 oz (95.142 kg)  04/13/12 216 lb 12 oz (98.317 kg)  02/15/12 220 lb (99.791 kg)    GEN: well developed, well nourished, no acute distress Eyes: conjunctiva and lids normal, PERRLA, EOMI ENT:  TM clear, nares clear, oral exam  WNL Neck: supple, no lymphadenopathy, no thyromegaly, no JVD Pulm: clear to auscultation and percussion, respiratory effort normal CV: regular rate and rhythm, S1-S2, no murmur, rub or gallop, no bruits, peripheral pulses normal and symmetric, no cyanosis, clubbing, edema or varicosities Chest: no scars, masses GI: soft, non-tender; no hepatosplenomegaly, masses; active bowel sounds all quadrants GU: no hernia, testicular mass, penile discharge, or prostate enlargement Lymph: no cervical, axillary or inguinal adenopathy MSK: gait normal, muscle tone and strength WNL, no joint swelling, effusions, discoloration, crepitus  SKIN: clear, good turgor, color WNL, no rashes, lesions, or ulcerations Neuro: normal mental status, normal strength, sensation, and motion Psych: alert; oriented to person, place and time, normally interactive and not anxious or depressed in appearance.  Assessment and Plan:  Routine general medical examination at a health care facility  Need for prophylactic vaccination against Streptococcus pneumoniae (pneumococcus) - Plan: Pneumococcal polysaccharide vaccine 23-valent greater than or equal to 2yo subcutaneous/IM   I have personally reviewed the Medicare Annual Wellness questionnaire and have noted 1. The patient's medical and social history 2. Their use of alcohol, tobacco or illicit drugs 3. Their current medications and supplements 4. The patient's functional ability including ADL's, fall risks, home safety risks and hearing or visual             impairment. 5. Diet and physical activities 6. Evidence for depression or mood disorders  The patients weight, height, BMI and visual acuity have been recorded in the chart I have made referrals, counseling and provided education to the patient based review of the above and I have provided the pt with a written personalized care plan for preventive services.  I have provided the patient with a copy of your personalized  plan for preventive services. Instructed to take the time to review along with their updated medication list.   Doing very well overall.   Orders Today:  Orders Placed This Encounter  Procedures  . Pneumococcal polysaccharide vaccine 23-valent greater than or equal to 2yo subcutaneous/IM    Updated Medication List: (Includes new medications, updates to list, dose adjustments) Meds ordered this encounter  Medications  . DISCONTD: pravastatin (PRAVACHOL) 20 MG tablet    Sig: TAKE ONE TABLET BY MOUTH ONCE DAILY    Dispense:  90 tablet    Refill:  3  . pravastatin (PRAVACHOL) 20 MG tablet    Sig: TAKE ONE TABLET BY MOUTH ONCE DAILY    Dispense:  90 tablet    Refill:  3    Medications Discontinued: Medications Discontinued During This Encounter  Medication Reason  . pravastatin (PRAVACHOL) 20 MG tablet Reorder  . pravastatin (PRAVACHOL) 20 MG tablet Reorder      Signed, Karleen Hampshire T. Belma Dyches, MD 12/22/2012 2:58 PM

## 2013-01-13 ENCOUNTER — Telehealth: Payer: Self-pay

## 2013-01-13 NOTE — Telephone Encounter (Signed)
Pt said mychart is not accessible due to his soc security # not correct in his chart. Revonda Standard at [employer] and giving pt flu shot clinic appt.

## 2013-01-16 ENCOUNTER — Encounter: Payer: Self-pay | Admitting: Family Medicine

## 2013-01-17 ENCOUNTER — Ambulatory Visit (INDEPENDENT_AMBULATORY_CARE_PROVIDER_SITE_OTHER): Payer: Medicare Other

## 2013-01-17 DIAGNOSIS — Z23 Encounter for immunization: Secondary | ICD-10-CM

## 2013-09-25 ENCOUNTER — Ambulatory Visit (INDEPENDENT_AMBULATORY_CARE_PROVIDER_SITE_OTHER): Payer: Medicare HMO | Admitting: Family Medicine

## 2013-09-25 ENCOUNTER — Encounter: Payer: Self-pay | Admitting: Family Medicine

## 2013-09-25 VITALS — BP 120/62 | HR 74 | Temp 98.5°F | Ht 72.0 in | Wt 217.0 lb

## 2013-09-25 DIAGNOSIS — J3489 Other specified disorders of nose and nasal sinuses: Secondary | ICD-10-CM

## 2013-09-25 DIAGNOSIS — R0981 Nasal congestion: Secondary | ICD-10-CM

## 2013-09-25 DIAGNOSIS — R011 Cardiac murmur, unspecified: Secondary | ICD-10-CM

## 2013-09-25 NOTE — Progress Notes (Signed)
[place] [address] Phone: [phone] Fax: [phone]  Patient ID: Corey Williams MRN: [id number], DOB: 1945-04-17, 68 y.o. Date of Encounter: 09/25/2013  Primary Physician:  Corey Loffler, MD   Chief Complaint: Facial Pain   Subjective:   History of Present Illness:  Corey Williams is a 68 y.o. very pleasant male patient who presents with the following:  Generally healthy gentleman who has basically only taken some cholesterol medicine for a number of years who presents with some sinus congestion and discomfort. On Saturday, his brother recently had to have a valve replacement.  Saturday, discomfort down on the left side of face, some discomfort. No significant cough - nothing to be concerned.   He is not having any chest pain, extremity edema, difficulty breathing, wheezing, or anything of that sort. Generally feels quite well except for his somewhat discomfort he has on his LEFT face in the maxillary sinus region. He has not had any other time neurological complaints.  Past Medical History, Surgical History, Social History, Family History, Problem List, Medications, and Allergies have been reviewed and updated if relevant.  Review of Systems:  GEN: No acute illnesses, no fevers, chills - other than above. GI: No n/v/d, eating normally Pulm: No SOB Interactive and getting along well at home.  Otherwise, ROS is as per the HPI.  Objective:   Physical Examination: BP 120/62  Pulse 74  Temp(Src) 98.5 F (36.9 C) (Oral)  Ht 6' (1.829 m)  Wt 217 lb (98.431 kg)  BMI 29.42 kg/m2   GEN: WDWN, NAD, Non-toxic, A & O x 3 HEENT: Atraumatic, Normocephalic. Neck supple. No masses, No LAD. Ears and Nose: No external deformity. CV: RRR, 3/6 SEM PULM: CTA B, no wheezes, crackles, rhonchi. No retractions. No resp. distress. No accessory muscle use. EXTR: No c/c/e NEURO Normal gait.  PSYCH: Normally interactive. Conversant. Not depressed or anxious appearing.   Calm demeanor.   Laboratory and Imaging Data:  Lipids:    Component Value Date/Time   CHOL 177 12/19/2012 0824   TRIG 92.0 12/19/2012 0824   HDL 38.50* 12/19/2012 0824   LDLDIRECT 139.1 10/23/2010 0929   VLDL 18.4 12/19/2012 0824   CHOLHDL 5 12/19/2012 0824   CBC: CBC Latest Ref Rng 12/19/2012 11/09/2011 10/23/2010  WBC 4.5 - 10.5 K/uL 6.7 6.0 6.2  Hemoglobin 13.0 - 17.0 g/dL 13.7 13.0 13.6  Hematocrit 39.0 - 52.0 % 40.8 40.0 41.5  Platelets 150.0 - 400.0 K/uL 226.0 226.0 188.4    Basic Metabolic Panel:    Component Value Date/Time   NA 139 09/25/2013 1634   K 4.5 09/25/2013 1634   CL 105 09/25/2013 1634   CO2 29 09/25/2013 1634   BUN 15 09/25/2013 1634   CREATININE 0.9 09/25/2013 1634   GLUCOSE 104* 09/25/2013 1634   CALCIUM 9.2 09/25/2013 1634   Hepatic Function Latest Ref Rng 12/19/2012 11/09/2011 10/23/2010  Total Protein 6.0 - 8.3 g/dL 7.6 7.3 7.3  Albumin 3.5 - 5.2 g/dL 4.2 4.0 4.3  AST 0 - 37 U/L _0 ALT 0 - 53 U/L _1 Alk Phosphatase 39 - 117 U/L 59 61 60  Total Bilirubin 0.3 - 1.2 mg/dL 1.0 1.0 1.1  Bilirubin, Direct 0.0 - 0.3 mg/dL 0.2 0.2 0.1    No results found for this basename: TSH   Lab Results  Component Value Date   PSA 0.78 12/19/2012   PSA 0.69 11/09/2011   PSA 0.87 10/23/2010  Assessment & Plan:   Cardiac murmur, previously undiagnosed - Plan: 2D Echocardiogram with contrast, Basic metabolic panel  Sinus congestion  No concern regarding his sinus congestion and discomfort on the LEFT. Most likely viral cold. Recommended Mucinex.  New-onset cardiac murmur, to me it appears quite loud. I'm going to go ahead and get an echocardiogram with contrast. I would like for him to see Cardiology after echo. Brother with recent valve replacement.  Orders Placed This Encounter  Procedures  . Basic metabolic panel  . Ambulatory referral to Cardiology  . 2D Echocardiogram with contrast    Follow-up: No Follow-up on file. Unless noted above, the patient is to  follow-up if symptoms worsen. Red flags were reviewed with the patient.  Signed,  Corey Deed. Corey Fitzhenry, MD, CAQ Sports Medicine   Discontinued Medications   CYCLOBENZAPRINE (FLEXERIL) 10 MG TABLET    Take 1 tablet (10 mg total) by mouth at bedtime.   Current Medications at Discharge:   Medication List       This list is accurate as of: 09/25/13 11:59 PM.  Always use your most recent med list.               aspirin 81 MG tablet  Take 81 mg by mouth daily.     CALCIUM 600 PO  Take 1 tablet by mouth daily.     Glucosamine 750 MG Tabs  Take 2 tablets by mouth daily.     multivitamin tablet  Take 1 tablet by mouth daily.     pravastatin 20 MG tablet  Commonly known as:  PRAVACHOL  TAKE ONE TABLET BY MOUTH ONCE DAILY

## 2013-09-25 NOTE — Progress Notes (Signed)
Pre visit review using our clinic review tool, if applicable. No additional management support is needed unless otherwise documented below in the visit note. 

## 2013-09-25 NOTE — Patient Instructions (Signed)
Mucinex (plain) twice a day  OR  Take Guaifenesin (400mg ), take 11/2 tabs by mouth AM and NOON. Get GUAIFENESIN by  going to [facility], [facility] or [facility] and getting MUCOUS RELIEF EXPECTORANT/CONGESTION. DO NOT GET MUCINEX (Timed Release Guaifenesin)    REFERRALS TO SPECIALISTS, SPECIAL TESTS (MRI, CT, ULTRASOUNDS)  GO THE WAITING ROOM AND TELL CHECK IN YOU NEED HELP WITH A REFERRAL. Either [facility] or [facility] will help you set it up.  If it is between 1-2 PM they may be at lunch.  After 5 PM, they will likely be at home.  They will call you, so please make sure the office has your correct phone number.  Referrals sometimes can be done same day if urgent, but others can take 2 or 3 days to get an appointment. Starting in 2015, many of the new Medicare insurance plans and [place] (Obamacare) Health plans offered take much longer for referrals. They have added additional paperwork and steps.  MRI's and CT's can take up to a week for the test. (Emergencies like strokes take precedence. I will tell you if you have an emergency.)   If your referral is to an [facility] office, their office may contact you directly prior to our office reaching you.  -- Examples: [facility], [facility], [place], [place]            Neurology, [facility], and many more.  Specialist appointment times vary a great deal, mostly on the specialist's schedule and if they have openings. -- Our office tries to get you in as fast as possible. -- Some specialists have very long wait times. (Example. Dermatology. Usually months) -- If you have a true emergency like new cancer, we work to get you in ASAP.

## 2013-09-26 LAB — BASIC METABOLIC PANEL
BUN: 15 mg/dL (ref 6–23)
CHLORIDE: 105 meq/L (ref 96–112)
CO2: 29 mEq/L (ref 19–32)
Calcium: 9.2 mg/dL (ref 8.4–10.5)
Creatinine, Ser: 0.9 mg/dL (ref 0.4–1.5)
GFR: 90.37 mL/min (ref >60.00)
Glucose, Bld: 104 mg/dL — ABNORMAL HIGH (ref 70–99)
POTASSIUM: 4.5 meq/L (ref 3.5–5.1)
Sodium: 139 mEq/L (ref 135–145)

## 2013-10-10 ENCOUNTER — Ambulatory Visit (HOSPITAL_COMMUNITY): Payer: Medicare HMO | Attending: Cardiovascular Disease | Admitting: Radiology

## 2013-10-10 DIAGNOSIS — I079 Rheumatic tricuspid valve disease, unspecified: Secondary | ICD-10-CM | POA: Insufficient documentation

## 2013-10-10 DIAGNOSIS — I359 Nonrheumatic aortic valve disorder, unspecified: Secondary | ICD-10-CM | POA: Insufficient documentation

## 2013-10-10 DIAGNOSIS — I517 Cardiomegaly: Secondary | ICD-10-CM | POA: Insufficient documentation

## 2013-10-10 DIAGNOSIS — E7800 Pure hypercholesterolemia, unspecified: Secondary | ICD-10-CM | POA: Insufficient documentation

## 2013-10-10 DIAGNOSIS — Z87891 Personal history of nicotine dependence: Secondary | ICD-10-CM | POA: Insufficient documentation

## 2013-10-10 DIAGNOSIS — R011 Cardiac murmur, unspecified: Secondary | ICD-10-CM | POA: Insufficient documentation

## 2013-10-10 DIAGNOSIS — I059 Rheumatic mitral valve disease, unspecified: Secondary | ICD-10-CM | POA: Insufficient documentation

## 2013-10-10 NOTE — Progress Notes (Signed)
Echocardiogram performed.  

## 2013-10-13 ENCOUNTER — Encounter: Payer: Self-pay | Admitting: Cardiovascular Disease

## 2013-10-13 ENCOUNTER — Ambulatory Visit (INDEPENDENT_AMBULATORY_CARE_PROVIDER_SITE_OTHER): Payer: Medicare HMO | Admitting: Cardiovascular Disease

## 2013-10-13 VITALS — BP 140/76 | HR 66 | Ht 73.0 in | Wt 210.0 lb

## 2013-10-13 DIAGNOSIS — I359 Nonrheumatic aortic valve disorder, unspecified: Secondary | ICD-10-CM

## 2013-10-13 DIAGNOSIS — I712 Thoracic aortic aneurysm, without rupture, unspecified: Secondary | ICD-10-CM

## 2013-10-13 DIAGNOSIS — R011 Cardiac murmur, unspecified: Secondary | ICD-10-CM

## 2013-10-13 DIAGNOSIS — E7800 Pure hypercholesterolemia, unspecified: Secondary | ICD-10-CM

## 2013-10-13 DIAGNOSIS — I7121 Aneurysm of the ascending aorta, without rupture: Secondary | ICD-10-CM

## 2013-10-13 DIAGNOSIS — I351 Nonrheumatic aortic (valve) insufficiency: Secondary | ICD-10-CM

## 2013-10-13 NOTE — Assessment & Plan Note (Signed)
Cholesterol is at goal on the current lipid regimen. No changes to the medications were made.  

## 2013-10-13 NOTE — Assessment & Plan Note (Signed)
Moderate aortic valve regurgitation on recent echocardiogram. Etiology of the regurgitation is unclear though concerning for bicuspid aortic valve. He had a long discussion about the regurgitation, showed him the pictures on the echocardiogram. He does not want a transesophageal echo at this time to evaluate his valves. Knowing if this was bicuspid or normal tricuspid valve would likely not change what we do at this time.   We have suggested he has worsening shortness of breath, leg edema that he call our office. We would recommend an repeat echo in one or 2 years depending on symptoms.

## 2013-10-13 NOTE — Progress Notes (Signed)
   Patient ID: Corey Williams, male    DOB: [birth year], 68 y.o.   MRN: [id number]  HPI Comments: Mr. Jallow is a very pleasant 68 year old gentleman with history of hyperlipidemia who presents by referral from Dr. Edilia Bo for murmur. Echocardiogram showed moderate aortic valve insufficiency and  We have reviewed the echocardiogram which is with him today and shown him the regurgitation. LV function is normal, there is a mildly dilated left atrium. Right heart pressures are relatively normal. Unclear if the aortic valve is bicuspid as it was not well visualized.  He denies having any symptoms of shortness of breath or chest tightness with exertion. He walks 3 miles per day, is active in his garden. He denies any lightheadedness or dizziness. Overall he feels well with no complaints. No lower extremity edema.  EKG shows normal sinus rhythm with rate 66 beats per minute, no significant ST or T wave changes   Outpatient Encounter Prescriptions as of 10/13/2013  Medication Sig  . aspirin 81 MG tablet Take 81 mg by mouth daily.    . Calcium Carbonate (CALCIUM 600 PO) Take 1 tablet by mouth daily.  . Glucosamine 750 MG TABS Take 2 tablets by mouth daily.  . Multiple Vitamin (MULTIVITAMIN) tablet Take 1 tablet by mouth daily.    . pravastatin (PRAVACHOL) 20 MG tablet TAKE ONE TABLET BY MOUTH ONCE DAILY    Review of Systems  Constitutional: Negative.   HENT: Negative.   Eyes: Negative.   Respiratory: Negative.   Cardiovascular: Negative.   Gastrointestinal: Negative.   Endocrine: Negative.   Musculoskeletal: Negative.   Skin: Negative.   Allergic/Immunologic: Negative.   Neurological: Negative.   Hematological: Negative.   Psychiatric/Behavioral: Negative.   All other systems reviewed and are negative.   BP 140/76  Pulse 66  Ht 6\' 1"  (1.854 m)  Wt 210 lb (95.255 kg)  BMI 27.71 kg/m2  Physical Exam  Nursing note and vitals reviewed. Constitutional: He is oriented to person, place, and  time. He appears well-developed and well-nourished.  HENT:  Head: Normocephalic.  Nose: Nose normal.  Mouth/Throat: Oropharynx is clear and moist.  Eyes: Conjunctivae are normal. Pupils are equal, round, and reactive to light.  Neck: Normal range of motion. Neck supple. No JVD present.  Cardiovascular: Normal rate, regular rhythm, S1 normal, S2 normal and intact distal pulses.  Exam reveals no gallop and no friction rub.   Murmur heard.  Decrescendo systolic murmur is present with a grade of 2/6  Pulmonary/Chest: Effort normal and breath sounds normal. No respiratory distress. He has no wheezes. He has no rales. He exhibits no tenderness.  Abdominal: Soft. Bowel sounds are normal. He exhibits no distension. There is no tenderness.  Musculoskeletal: Normal range of motion. He exhibits no edema and no tenderness.  Lymphadenopathy:    He has no cervical adenopathy.  Neurological: He is alert and oriented to person, place, and time. Coordination normal.  Skin: Skin is warm and dry. No rash noted. No erythema.  Psychiatric: He has a normal mood and affect. His behavior is normal. Judgment and thought content normal.      Assessment and Plan

## 2013-10-13 NOTE — Assessment & Plan Note (Signed)
3.9 cm dilation of his aorta. This will need to be monitored with periodic echocardiography. This is mild at this time, often times associated with bicuspid valve. Unable to confirm bicuspid valve and he has chosen not to have TEE at this time which I think is fine.

## 2013-10-13 NOTE — Patient Instructions (Signed)
You are doing well. No medication changes were made.  Call for shortness of breath, leg swelling  Call early if you would like a echocardiogram in one year  Please call us if you have new issues that need to be addressed before your next appt.  Your physician wants you to follow-up in: 12 months.  You will receive a reminder letter in the mail two months in advance. If you don't receive a letter, please call our office to schedule the follow-up appointment.

## 2013-12-20 ENCOUNTER — Other Ambulatory Visit (INDEPENDENT_AMBULATORY_CARE_PROVIDER_SITE_OTHER): Payer: Medicare HMO

## 2013-12-20 DIAGNOSIS — Z79899 Other long term (current) drug therapy: Secondary | ICD-10-CM

## 2013-12-20 DIAGNOSIS — Z125 Encounter for screening for malignant neoplasm of prostate: Secondary | ICD-10-CM

## 2013-12-20 DIAGNOSIS — Z0000 Encounter for general adult medical examination without abnormal findings: Secondary | ICD-10-CM

## 2013-12-20 DIAGNOSIS — E7800 Pure hypercholesterolemia, unspecified: Secondary | ICD-10-CM

## 2013-12-20 LAB — CBC WITH DIFFERENTIAL/PLATELET
Basophils Absolute: 0.1 10*3/uL (ref 0.0–0.1)
Basophils Relative: 0.7 % (ref 0.0–3.0)
EOS PCT: 4.8 % (ref 0.0–5.0)
Eosinophils Absolute: 0.4 10*3/uL (ref 0.0–0.7)
HEMATOCRIT: 40.5 % (ref 39.0–52.0)
Hemoglobin: 13.5 g/dL (ref 13.0–17.0)
LYMPHS ABS: 1.3 10*3/uL (ref 0.7–4.0)
LYMPHS PCT: 17.2 % (ref 12.0–46.0)
MCHC: 33.3 g/dL (ref 30.0–36.0)
MCV: 94.8 fl (ref 78.0–100.0)
Monocytes Absolute: 0.7 10*3/uL (ref 0.1–1.0)
Monocytes Relative: 8.3 % (ref 3.0–12.0)
NEUTROS PCT: 69 % (ref 43.0–77.0)
Neutro Abs: 5.4 10*3/uL (ref 1.4–7.7)
Platelets: 229 10*3/uL (ref 150.0–400.0)
RBC: 4.27 Mil/uL (ref 4.22–5.81)
RDW: 13.6 % (ref 11.5–15.5)
WBC: 7.8 10*3/uL (ref 4.0–10.5)

## 2013-12-20 LAB — LIPID PANEL
Cholesterol: 185 mg/dL (ref 0–200)
HDL: 36.5 mg/dL — AB (ref >39.00)
LDL Cholesterol: 130 mg/dL — ABNORMAL HIGH (ref 0–99)
NonHDL: 148.5
TRIGLYCERIDES: 93 mg/dL (ref 0.0–149.0)
Total CHOL/HDL Ratio: 5
VLDL: 18.6 mg/dL (ref 0.0–40.0)

## 2013-12-20 LAB — PSA, MEDICARE: PSA: 0.91 ng/mL (ref 0.10–4.00)

## 2013-12-20 LAB — BASIC METABOLIC PANEL
BUN: 21 mg/dL (ref 6–23)
CALCIUM: 9.5 mg/dL (ref 8.4–10.5)
CO2: 30 meq/L (ref 19–32)
CREATININE: 1.1 mg/dL (ref 0.4–1.5)
Chloride: 104 mEq/L (ref 96–112)
GFR: 72.23 mL/min (ref >60.00)
Glucose, Bld: 83 mg/dL (ref 70–99)
Potassium: 4.7 mEq/L (ref 3.5–5.1)
SODIUM: 140 meq/L (ref 135–145)

## 2013-12-20 LAB — HEPATIC FUNCTION PANEL
ALBUMIN: 4.1 g/dL (ref 3.5–5.2)
ALK PHOS: 71 U/L (ref 39–117)
ALT: 19 U/L (ref 0–53)
AST: 21 U/L (ref 0–37)
Bilirubin, Direct: 0.1 mg/dL (ref 0.0–0.3)
TOTAL PROTEIN: 7.8 g/dL (ref 6.0–8.3)
Total Bilirubin: 1.1 mg/dL (ref 0.2–1.2)

## 2013-12-20 LAB — TSH: TSH: 3.76 u[IU]/mL (ref 0.35–4.50)

## 2013-12-27 ENCOUNTER — Ambulatory Visit (INDEPENDENT_AMBULATORY_CARE_PROVIDER_SITE_OTHER): Payer: Medicare HMO | Admitting: Family Medicine

## 2013-12-27 ENCOUNTER — Encounter: Payer: Self-pay | Admitting: Family Medicine

## 2013-12-27 VITALS — BP 130/76 | HR 66 | Temp 98.6°F | Ht 72.0 in | Wt 212.5 lb

## 2013-12-27 DIAGNOSIS — Z0000 Encounter for general adult medical examination without abnormal findings: Secondary | ICD-10-CM

## 2013-12-27 DIAGNOSIS — Z23 Encounter for immunization: Secondary | ICD-10-CM

## 2013-12-27 MED ORDER — PRAVASTATIN SODIUM 40 MG PO TABS: ORAL_TABLET | ORAL | Status: DC

## 2013-12-27 NOTE — Progress Notes (Signed)
Pre visit review using our clinic review tool, if applicable. No additional management support is needed unless otherwise documented below in the visit note. 

## 2013-12-27 NOTE — Progress Notes (Signed)
Dr. Frederico Hamman T. Mahlani Berninger, MD, [facility] [place], [address] Phone: [phone] Fax: [phone]  12/27/2013  Patient: Corey Williams, MRN: [id number], DOB: 09/11/1945, 68 y.o.  Primary Physician:  Owens Loffler, MD  Chief Complaint: Annual Exam  Subjective:   Corey Williams is a 68 y.o. pleasant patient who presents for a medicare wellness examination:  Preventative Health Maintenance Visit:  Health Maintenance Summary Reviewed and updated, unless pt declines services.  Tobacco History Reviewed. Alcohol: No concerns, no excessive use Exercise Habits: Some activity, rec at least 30 mins 5 times a week STD concerns: no risk or activity to increase risk Drug Use: None Encouraged self-testicular check  He is feeling well overall.  Health Maintenance  Topic Date Due  . Tetanus/tdap  10/22/1964  . Influenza Vaccine  10/22/2014  . Colonoscopy  02/14/2022  . Pneumococcal Polysaccharide Vaccine Age 63 And Over  Addressed  . Zostavax  Addressed    Immunization History  Administered Date(s) Administered  . Influenza,inj,Quad PF,36+ Mos 01/17/2013, 12/27/2013  . Pneumococcal Conjugate-13 12/27/2013  . Pneumococcal Polysaccharide-23 12/22/2012    Patient Active Problem List   Diagnosis Date Noted  . Hereditary essential tremor 12/28/2013  . Aortic valve regurgitation 10/13/2013  . Aneurysm of ascending aorta 10/13/2013  . HYPERCHOLESTEROLEMIA 06/05/2010  . ACTINIC KERATOSIS, HEAD 06/05/2010   Past Medical History  Diagnosis Date  . Hyperlipidemia   . Heart murmur   . Hereditary essential tremor 12/28/2013   Past Surgical History  Procedure Laterality Date  . No prior surgery     History   Social History  . Marital Status: Married    Spouse Name: N/A    Number of Children: N/A  . Years of Education: N/A   Occupational History  . sales    Social History Main Topics  . Smoking status: Former Smoker     Quit date: 02/01/1974  . Smokeless tobacco: Never Used  . Alcohol Use: 3.0 oz/week    5 Cans of beer per week     Comment: occ  . Drug Use: No  . Sexual Activity: Not on file   Other Topics Concern  . Not on file   Social History Narrative   Regular exercise--yes   Family History  Problem Relation Age of Onset  . Colon cancer Neg Hx   . Stomach cancer Neg Hx   . Stroke Father    No Known Allergies  Medication list has been reviewed and updated.   General: Denies fever, chills, sweats. No significant weight loss. Eyes: Denies blurring,significant itching ENT: Denies earache, sore throat, and hoarseness. Cardiovascular: Denies chest pains, palpitations, dyspnea on exertion Respiratory: Denies cough, dyspnea at rest,wheeezing Breast: no concerns about lumps GI: Denies nausea, vomiting, diarrhea, constipation, change in bowel habits, abdominal pain, melena, hematochezia GU: Denies penile discharge, ED, urinary flow / outflow problems. No STD concerns. Musculoskeletal: Denies back pain, joint pain Derm: Denies rash, itching Neuro: Denies  paresthesias, frequent falls, frequent headaches Psych: Denies depression, anxiety Endocrine: Denies cold intolerance, heat intolerance, polydipsia Heme: Denies enlarged lymph nodes Allergy: No hayfever  Objective:   BP 130/76  Pulse 66  Temp(Src) 98.6 F (37 C) (Oral)  Ht 6' (1.829 m)  Wt 212 lb 8 oz (96.389 kg)  BMI 28.81 kg/m2  SpO2 95%  The patient completed a fall screen and PHQ-2 and PHQ-9 if necessary, which is documented in the EHR. The CMA/LPN/RN who assisted the patient  verbally completed with them and documented results in [facility].   Hearing Screening   125Hz 250Hz 500Hz 1000Hz 2000Hz 4000Hz 8000Hz  Right ear:   40 40 40 0   Left ear:   40 40 40 0     Visual Acuity Screening   Right eye Left eye Both eyes  Without correction:     With correction: 20/25 20/30 20/20    GEN: well developed, well  nourished, no acute distress Eyes: conjunctiva and lids normal, PERRLA, EOMI ENT: TM clear, nares clear, oral exam WNL Neck: supple, no lymphadenopathy, no thyromegaly, no JVD Pulm: clear to auscultation and percussion, respiratory effort normal CV: regular rate and rhythm, S1-S2, no murmur, rub or gallop, no bruits, peripheral pulses normal and symmetric, no cyanosis, clubbing, edema or varicosities GI: soft, non-tender; no hepatosplenomegaly, masses; active bowel sounds all quadrants GU: no hernia, testicular mass, penile discharge Lymph: no cervical, axillary or inguinal adenopathy MSK: gait normal, muscle tone and strength WNL, no joint swelling, effusions, discoloration, crepitus  SKIN: clear, good turgor, color WNL, no rashes, lesions, or ulcerations Neuro: normal mental status, normal strength, sensation, and motion Psych: alert; oriented to person, place and time, normally interactive and not anxious or depressed in appearance.  All labs reviewed with patient.  Lipids:    Component Value Date/Time   CHOL 185 12/20/2013 1051   TRIG 93.0 12/20/2013 1051   HDL 36.50* 12/20/2013 1051   LDLDIRECT 139.1 10/23/2010 0929   VLDL 18.6 12/20/2013 1051   CHOLHDL 5 12/20/2013 1051   CBC: CBC Latest Ref Rng 12/20/2013 12/19/2012 11/09/2011  WBC 4.0 - 10.5 K/uL 7.8 6.7 6.0  Hemoglobin 13.0 - 17.0 g/dL 13.5 13.7 13.0  Hematocrit 39.0 - 52.0 % 40.5 40.8 40.0  Platelets 150.0 - 400.0 K/uL 229.0 226.0 630.1    Basic Metabolic Panel:    Component Value Date/Time   NA 140 12/20/2013 1051   K 4.7 12/20/2013 1051   CL 104 12/20/2013 1051   CO2 30 12/20/2013 1051   BUN 21 12/20/2013 1051   CREATININE 1.1 12/20/2013 1051   GLUCOSE 83 12/20/2013 1051   CALCIUM 9.5 12/20/2013 1051   Hepatic Function Latest Ref Rng 12/20/2013 12/19/2012 11/09/2011  Total Protein 6.0 - 8.3 g/dL 7.8 7.6 7.3  Albumin 3.5 - 5.2 g/dL 4.1 4.2 4.0  AST 0 - 37 U/L _0 ALT 0 - 53 U/L _1 Alk Phosphatase 39 - 117 U/L 71  59 61  Total Bilirubin 0.2 - 1.2 mg/dL 1.1 1.0 1.0  Bilirubin, Direct 0.0 - 0.3 mg/dL 0.1 0.2 0.2    Lab Results  Component Value Date   TSH 3.76 12/20/2013   Lab Results  Component Value Date   PSA 0.91 12/20/2013   PSA 0.78 12/19/2012   PSA 0.69 11/09/2011    Assessment and Plan:   Health care maintenance  Need for vaccination with 13-polyvalent pneumococcal conjugate vaccine - Plan: Pneumococcal conjugate vaccine 13-valent  Need for prophylactic vaccination and inoculation against influenza - Plan: Flu Vaccine QUAD 36+ mos PF IM (Fluarix Quad PF)  Health Maintenance Exam: The patient's preventative maintenance and recommended screening tests for an annual wellness exam were reviewed in full today. Brought up to date unless services declined.  Counselled on the importance of diet, exercise, and its role in overall health and mortality. The patient's FH and SH was reviewed, including their home life, tobacco status, and drug and alcohol status.  I have  personally reviewed the Medicare Annual Wellness questionnaire and have noted 1. The patient's medical and social history 2. Their use of alcohol, tobacco or illicit drugs 3. Their current medications and supplements 4. The patient's functional ability including ADL's, fall risks, home safety risks and hearing or visual             impairment. 5. Diet and physical activities 6. Evidence for depression or mood disorders  The patients weight, height, BMI and visual acuity have been recorded in the chart I have made referrals, counseling and provided education to the patient based review of the above and I have provided the pt with a written personalized care plan for preventive services.  I have provided the patient with a copy of your personalized plan for preventive services. Instructed to take the time to review along with their updated medication list.  Follow-up: No Follow-up on file. Or follow-up in 1 year for complete  physical examination  New Prescriptions   No medications on file   Orders Placed This Encounter  Procedures  . Flu Vaccine QUAD 36+ mos PF IM (Fluarix Quad PF)  . Pneumococcal conjugate vaccine 13-valent    Signed,  Eduin Friedel T. Macky Galik, MD   Patient's Medications  New Prescriptions   No medications on file  Previous Medications   ASPIRIN 81 MG TABLET    Take 81 mg by mouth daily.     CALCIUM CARBONATE (CALCIUM 600 PO)    Take 1 tablet by mouth daily.   GLUCOSAMINE CHONDROITIN COMPLX PO    Take 2 tablets by mouth daily.   IBUPROFEN (ADVIL,MOTRIN) 200 MG TABLET    Take 200 mg by mouth every 6 (six) hours as needed.   MULTIPLE VITAMIN (MULTIVITAMIN) TABLET    Take 1 tablet by mouth daily.    Modified Medications   Modified Medication Previous Medication   PRAVASTATIN (PRAVACHOL) 40 MG TABLET pravastatin (PRAVACHOL) 20 MG tablet      TAKE ONE TABLET BY MOUTH ONCE DAILY    TAKE ONE TABLET BY MOUTH ONCE DAILY  Discontinued Medications   GLUCOSAMINE 750 MG TABS    Take 2 tablets by mouth daily.

## 2013-12-28 ENCOUNTER — Encounter: Payer: Self-pay | Admitting: Family Medicine

## 2013-12-28 DIAGNOSIS — G250 Essential tremor: Secondary | ICD-10-CM | POA: Insufficient documentation

## 2013-12-28 HISTORY — DX: Essential tremor: G25.0

## 2014-01-11 ENCOUNTER — Encounter: Payer: Self-pay | Admitting: Family Medicine

## 2014-07-11 ENCOUNTER — Ambulatory Visit (INDEPENDENT_AMBULATORY_CARE_PROVIDER_SITE_OTHER)
Admission: RE | Admit: 2014-07-11 | Discharge: 2014-07-11 | Disposition: A | Discharge status: Home / Self Care | Payer: Medicare HMO | Source: Ambulatory Visit | Attending: Family Medicine | Admitting: Family Medicine

## 2014-07-11 ENCOUNTER — Ambulatory Visit (INDEPENDENT_AMBULATORY_CARE_PROVIDER_SITE_OTHER): Payer: Medicare HMO | Admitting: Family Medicine

## 2014-07-11 ENCOUNTER — Encounter: Payer: Self-pay | Admitting: Family Medicine

## 2014-07-11 VITALS — BP 126/60 | HR 77 | Temp 98.4°F | Ht 72.0 in | Wt 213.0 lb

## 2014-07-11 DIAGNOSIS — R042 Hemoptysis: Secondary | ICD-10-CM

## 2014-07-11 DIAGNOSIS — J0101 Acute recurrent maxillary sinusitis: Secondary | ICD-10-CM | POA: Diagnosis not present

## 2014-07-11 MED ORDER — LEVOFLOXACIN 500 MG PO TABS: 500.0000 mg | ORAL_TABLET | Freq: Every day | ORAL | Status: DC

## 2014-07-11 NOTE — Progress Notes (Signed)
Dr. Frederico Hamman T. Jonavan Vanhorn, MD, [facility] [place], [address] Phone: [phone] Fax: [phone]  07/11/2014  Patient: Corey Williams, MRN: [id number], DOB: [birth year], 69 y.o.  Primary Physician:  Owens Loffler, MD  Chief Complaint: Cough  Subjective:   Corey Williams is a 69 y.o. very pleasant male patient who presents with the following:  2 1/2 weeks ago had some sinus issues. Dx with sinusitis and got 7 days of Augmentin. Felt a lot better, and phlegm still colored and bloody - he was concerned about this. And he had a return of his sinus pain symptoms.  When spitting it out, will have blood mixed in.   Past Medical History, Surgical History, Social History, Family History, Problem List, Medications, and Allergies have been reviewed and updated if relevant.   GEN: No acute illnesses, no fevers, chills. GI: No n/v/d, eating normally Pulm: No SOB Interactive and getting along well at home.  Otherwise, ROS is as per the HPI.  Objective:   BP 126/60 mmHg  Pulse 77  Temp(Src) 98.4 F (36.9 C) (Oral)  Ht 6' (1.829 m)  Wt 213 lb (96.616 kg)  BMI 28.88 kg/m2  SpO2 96%   Gen: WDWN, NAD; alert,appropriate and cooperative throughout exam  HEENT: Normocephalic and atraumatic. Throat clear, w/o exudate, no LAD, R TM clear, L TM - good landmarks, No fluid present. rhinnorhea.  Left frontal and maxillary sinuses: Tender, max Right frontal and maxillary sinuses: Tender, max  Neck: No ant or post LAD CV: RRR, 2/6 murmur Pulm: Breathing comfortably in no resp distress. no w/c/r Abd: S,NT,ND,+BS Extr: no c/c/e Psych: full affect, pleasant   Laboratory and Imaging Data: Dg Chest 2 View  07/11/2014   CLINICAL DATA:  Dry cough.  EXAM: CHEST  2 VIEW  COMPARISON:  None.  FINDINGS: Mediastinum and hilar structures normal. Lungs are clear. Heart size normal. No pleural effusion or pneumothorax. No acute bony abnormality  identified.  IMPRESSION: No acute cardiopulmonary disease.   Electronically Signed   By: Marcello Moores  Register   On: 07/11/2014 14:30     Assessment and Plan:   Acute recurrent maxillary sinusitis  Bloody sputum - Plan: DG Chest 2 View  Treatment failure sinusitis. LVQ, supportive care.  Follow-up: No Follow-up on file.  New Prescriptions   LEVOFLOXACIN (LEVAQUIN) 500 MG TABLET    Take 1 tablet (500 mg total) by mouth daily.   Orders Placed This Encounter  Procedures  . DG Chest 2 View    Signed,  Frederico Hamman T. Zacharia Sowles, MD   Patient's Medications  New Prescriptions   LEVOFLOXACIN (LEVAQUIN) 500 MG TABLET    Take 1 tablet (500 mg total) by mouth daily.  Previous Medications   ASPIRIN 81 MG TABLET    Take 81 mg by mouth daily.     CALCIUM CARBONATE (CALCIUM 600 PO)    Take 1 tablet by mouth daily.   GLUCOSAMINE CHONDROITIN COMPLX PO    Take 2 tablets by mouth daily.   IBUPROFEN (ADVIL,MOTRIN) 200 MG TABLET    Take 200 mg by mouth every 6 (six) hours as needed.   MULTIPLE VITAMIN (MULTIVITAMIN) TABLET    Take 1 tablet by mouth daily.     PRAVASTATIN (PRAVACHOL) 40 MG TABLET    TAKE ONE TABLET BY MOUTH ONCE DAILY  Modified Medications   No medications on file  Discontinued Medications   No medications on file

## 2014-07-11 NOTE — Progress Notes (Signed)
Pre visit review using our clinic review tool, if applicable. No additional management support is needed unless otherwise documented below in the visit note. 

## 2014-09-27 ENCOUNTER — Other Ambulatory Visit: Payer: Self-pay

## 2014-09-27 MED ORDER — PRAVASTATIN SODIUM 40 MG PO TABS: ORAL_TABLET | ORAL | Status: DC

## 2014-10-16 ENCOUNTER — Ambulatory Visit (INDEPENDENT_AMBULATORY_CARE_PROVIDER_SITE_OTHER): Payer: Medicare HMO | Admitting: Cardiovascular Disease

## 2014-10-16 ENCOUNTER — Encounter: Payer: Self-pay | Admitting: Cardiovascular Disease

## 2014-10-16 VITALS — BP 138/66 | HR 63 | Ht 73.0 in | Wt 213.8 lb

## 2014-10-16 DIAGNOSIS — I351 Nonrheumatic aortic (valve) insufficiency: Secondary | ICD-10-CM

## 2014-10-16 DIAGNOSIS — E7800 Pure hypercholesterolemia, unspecified: Secondary | ICD-10-CM

## 2014-10-16 DIAGNOSIS — I712 Thoracic aortic aneurysm, without rupture: Secondary | ICD-10-CM | POA: Diagnosis not present

## 2014-10-16 DIAGNOSIS — I7121 Aneurysm of the ascending aorta, without rupture: Secondary | ICD-10-CM

## 2014-10-16 DIAGNOSIS — I77810 Thoracic aortic ectasia: Secondary | ICD-10-CM

## 2014-10-16 NOTE — Progress Notes (Signed)
Patient ID: Corey Williams, male    DOB: 09/22/1945, 69 y.o.   MRN: [id number]  HPI Comments: Mr. Weimer is a very pleasant 69 year old gentleman with history of hyperlipidemia with history of moderate aortic valve insufficiency, unable to exclude bicuspid aortic valve, mildly dilated Ascending aorta on echocardiogram in 2015 who presents for follow-up of his aortic valve regurgitation.  In general he reports that he is doing well. Denies any symptoms of shortness of breath or chest tightness with exertion. He walks 3 miles per day, is active in his garden. He denies any lightheadedness or dizziness.  No lower extremity edema. no new symptoms   EKG shows normal sinus rhythm with rate 63 beats per minute, Nonspecific ST and T wave abnormality   No Known Allergies  Current Outpatient Prescriptions on File Prior to Visit  Medication Sig Dispense Refill  . aspirin 81 MG tablet Take 81 mg by mouth daily.      . Calcium Carbonate (CALCIUM 600 PO) Take 1 tablet by mouth daily.    Marland Kitchen GLUCOSAMINE CHONDROITIN COMPLX PO Take 2 tablets by mouth daily.    Marland Kitchen ibuprofen (ADVIL,MOTRIN) 200 MG tablet Take 200 mg by mouth every 6 (six) hours as needed.    . Multiple Vitamin (MULTIVITAMIN) tablet Take 1 tablet by mouth daily.      . pravastatin (PRAVACHOL) 40 MG tablet TAKE ONE TABLET BY MOUTH ONCE DAILY 90 tablet 0   No current facility-administered medications on file prior to visit.    Past Medical History  Diagnosis Date  . Hyperlipidemia   . Heart murmur   . Hereditary essential tremor 12/28/2013    Past Surgical History  Procedure Laterality Date  . No prior surgery      Social History  reports that he quit smoking about 40 years ago. He has never used smokeless tobacco. He reports that he drinks about 3.0 oz of alcohol per week. He reports that he does not use illicit drugs.  Family History family history includes Stroke in his father. There is no history of Colon cancer or Stomach  cancer.   Review of Systems  Constitutional: Negative.   Respiratory: Negative.   Cardiovascular: Negative.   Gastrointestinal: Negative.   Musculoskeletal: Negative.   Neurological: Negative.   Hematological: Negative.   Psychiatric/Behavioral: Negative.   All other systems reviewed and are negative.   BP 138/66 mmHg  Pulse 63  Ht 6\' 1"  (1.854 m)  Wt 213 lb 12 oz (96.956 kg)  BMI 28.21 kg/m2  Physical Exam  Constitutional: He is oriented to person, place, and time. He appears well-developed and well-nourished.  HENT:  Head: Normocephalic.  Nose: Nose normal.  Mouth/Throat: Oropharynx is clear and moist.  Eyes: Conjunctivae are normal. Pupils are equal, round, and reactive to light.  Neck: Normal range of motion. Neck supple. No JVD present.  Cardiovascular: Normal rate, regular rhythm, S1 normal, S2 normal and intact distal pulses.  Exam reveals no gallop and no friction rub.   Murmur heard.  Decrescendo systolic murmur is present with a grade of 2/6  Pulmonary/Chest: Effort normal and breath sounds normal. No respiratory distress. He has no wheezes. He has no rales. He exhibits no tenderness.  Abdominal: Soft. Bowel sounds are normal. He exhibits no distension. There is no tenderness.  Musculoskeletal: Normal range of motion. He exhibits no edema or tenderness.  Lymphadenopathy:    He has no cervical adenopathy.  Neurological: He is alert and oriented to person, place, and time.  Coordination normal.  Skin: Skin is warm and dry. No rash noted. No erythema.  Psychiatric: He has a normal mood and affect. His behavior is normal. Judgment and thought content normal.      Assessment and Plan   Nursing note and vitals reviewed.

## 2014-10-16 NOTE — Assessment & Plan Note (Signed)
Cholesterol is at goal on the current lipid regimen. No changes to the medications were made.  

## 2014-10-16 NOTE — Patient Instructions (Signed)
You are doing well. No medication changes were made.  We will schedule an echocardiogram for moderate aortic valve regurgitation and ascending aorta dilitation in 09/2015  Please call us if you have new issues that need to be addressed before your next appt.  Your physician wants you to follow-up in: 12 months, after the echo You will receive a reminder letter in the mail two months in advance. If you don't receive a letter, please call our office to schedule the follow-up appointment.

## 2014-10-16 NOTE — Assessment & Plan Note (Signed)
Repeat echocardiogram ordered for July 2017 Mildly dilated on prior echocardiogram

## 2014-10-16 NOTE — Assessment & Plan Note (Signed)
Moderate aortic valve regurgitation on prior echocardiogram. Currently asymptomatic Repeat echocardiogram in 2017

## 2014-12-31 ENCOUNTER — Other Ambulatory Visit (INDEPENDENT_AMBULATORY_CARE_PROVIDER_SITE_OTHER): Payer: Medicare HMO

## 2014-12-31 DIAGNOSIS — E785 Hyperlipidemia, unspecified: Secondary | ICD-10-CM

## 2014-12-31 DIAGNOSIS — Z79899 Other long term (current) drug therapy: Secondary | ICD-10-CM

## 2014-12-31 DIAGNOSIS — Z125 Encounter for screening for malignant neoplasm of prostate: Secondary | ICD-10-CM | POA: Diagnosis not present

## 2014-12-31 LAB — HEPATIC FUNCTION PANEL
ALBUMIN: 4.1 g/dL (ref 3.5–5.2)
ALT: 13 U/L (ref 0–53)
AST: 14 U/L (ref 0–37)
Alkaline Phosphatase: 65 U/L (ref 39–117)
Bilirubin, Direct: 0.2 mg/dL (ref 0.0–0.3)
TOTAL PROTEIN: 7.5 g/dL (ref 6.0–8.3)
Total Bilirubin: 0.8 mg/dL (ref 0.2–1.2)

## 2014-12-31 LAB — LIPID PANEL
CHOL/HDL RATIO: 4
Cholesterol: 170 mg/dL (ref 0–200)
HDL: 39.8 mg/dL (ref >39.00)
LDL Cholesterol: 111 mg/dL — ABNORMAL HIGH (ref 0–99)
NonHDL: 129.98
Triglycerides: 94 mg/dL (ref 0.0–149.0)
VLDL: 18.8 mg/dL (ref 0.0–40.0)

## 2014-12-31 LAB — BASIC METABOLIC PANEL
BUN: 12 mg/dL (ref 6–23)
CALCIUM: 9.6 mg/dL (ref 8.4–10.5)
CO2: 31 meq/L (ref 19–32)
Chloride: 104 mEq/L (ref 96–112)
Creatinine, Ser: 0.92 mg/dL (ref 0.40–1.50)
GFR: 86.65 mL/min (ref >60.00)
Glucose, Bld: 89 mg/dL (ref 70–99)
Potassium: 4.5 mEq/L (ref 3.5–5.1)
SODIUM: 141 meq/L (ref 135–145)

## 2014-12-31 LAB — CBC WITH DIFFERENTIAL/PLATELET
BASOS ABS: 0.1 10*3/uL (ref 0.0–0.1)
Basophils Relative: 0.8 % (ref 0.0–3.0)
Eosinophils Absolute: 0.5 10*3/uL (ref 0.0–0.7)
Eosinophils Relative: 6.9 % — ABNORMAL HIGH (ref 0.0–5.0)
HEMATOCRIT: 42.7 % (ref 39.0–52.0)
HEMOGLOBIN: 14.2 g/dL (ref 13.0–17.0)
LYMPHS PCT: 19.9 % (ref 12.0–46.0)
Lymphs Abs: 1.4 10*3/uL (ref 0.7–4.0)
MCHC: 33.2 g/dL (ref 30.0–36.0)
MCV: 95.5 fl (ref 78.0–100.0)
MONOS PCT: 8.5 % (ref 3.0–12.0)
Monocytes Absolute: 0.6 10*3/uL (ref 0.1–1.0)
NEUTROS ABS: 4.5 10*3/uL (ref 1.4–7.7)
Neutrophils Relative %: 63.9 % (ref 43.0–77.0)
Platelets: 235 10*3/uL (ref 150.0–400.0)
RBC: 4.47 Mil/uL (ref 4.22–5.81)
RDW: 13.5 % (ref 11.5–15.5)
WBC: 7.1 10*3/uL (ref 4.0–10.5)

## 2014-12-31 LAB — PSA, MEDICARE: PSA: 0.82 ng/ml (ref 0.10–4.00)

## 2015-01-07 ENCOUNTER — Ambulatory Visit (INDEPENDENT_AMBULATORY_CARE_PROVIDER_SITE_OTHER): Payer: Medicare HMO | Admitting: Family Medicine

## 2015-01-07 ENCOUNTER — Encounter: Payer: Self-pay | Admitting: Family Medicine

## 2015-01-07 VITALS — BP 128/62 | HR 61 | Temp 98.0°F | Ht 72.25 in | Wt 212.2 lb

## 2015-01-07 DIAGNOSIS — Z23 Encounter for immunization: Secondary | ICD-10-CM | POA: Diagnosis not present

## 2015-01-07 DIAGNOSIS — Z0000 Encounter for general adult medical examination without abnormal findings: Secondary | ICD-10-CM

## 2015-01-07 MED ORDER — PRAVASTATIN SODIUM 40 MG PO TABS: ORAL_TABLET | ORAL | Status: DC

## 2015-01-07 NOTE — Progress Notes (Signed)
 Dr. Spencer T. Copland, MD, [facility] [address] [place], [address] Phone: [phone] Fax: [phone]  01/07/2015  Patient: Corey Williams, MRN: [id number], DOB: 05/01/1945, 69 y.o.  Primary Physician:  Spencer Copland, MD   Chief Complaint  Patient presents with  . Annual Exam    Medicare Wellness   Subjective:   Corey Williams is a 69 y.o. pleasant patient who presents for a medicare wellness examination:  Preventative Health Maintenance Visit:  Health Maintenance Summary Reviewed and updated, unless pt declines services.  Tobacco History Reviewed. Alcohol: No concerns, no excessive use Exercise Habits: Walks 3 miles 7 days a week STD concerns: no risk or activity to increase risk Drug Use: None Encouraged self-testicular check  Health Maintenance  Topic Date Due  . Hepatitis C Screening  12/09/1945  . TETANUS/TDAP  10/22/1964  . INFLUENZA VACCINE  10/22/2015  . COLONOSCOPY  02/14/2022  . ZOSTAVAX  Addressed  . PNA vac Low Risk Adult  Completed    Immunization History  Administered Date(s) Administered  . Influenza,inj,Quad PF,36+ Mos 01/17/2013, 12/27/2013, 01/07/2015  . Pneumococcal Conjugate-13 12/27/2013  . Pneumococcal Polysaccharide-23 12/22/2012    Patient Active Problem List   Diagnosis Date Noted  . Hereditary essential tremor 12/28/2013  . Aortic valve regurgitation 10/13/2013  . Aneurysm of ascending aorta (HCC) 10/13/2013  . HYPERCHOLESTEROLEMIA 06/05/2010  . ACTINIC KERATOSIS, HEAD 06/05/2010   Past Medical History  Diagnosis Date  . Hyperlipidemia   . Heart murmur   . Hereditary essential tremor 12/28/2013   Past Surgical History  Procedure Laterality Date  . No prior surgery     Social History   Social History  . Marital Status: Married    Spouse Name: N/A  . Number of Children: N/A  . Years of Education: N/A   Occupational History  . sales    Social History Main Topics    . Smoking status: Former Smoker    Quit date: 02/01/1974  . Smokeless tobacco: Never Used  . Alcohol Use: 3.0 oz/week    5 Cans of beer per week     Comment: occ  . Drug Use: No  . Sexual Activity: Not on file   Other Topics Concern  . Not on file   Social History Narrative   Regular exercise--yes   Family History  Problem Relation Age of Onset  . Colon cancer Neg Hx   . Stomach cancer Neg Hx   . Stroke Father    No Known Allergies  Medication list has been reviewed and updated.   General: Denies fever, chills, sweats. No significant weight loss. Eyes: Denies blurring,significant itching ENT: Denies earache, sore throat, and hoarseness. Cardiovascular: Denies chest pains, palpitations, dyspnea on exertion Respiratory: Denies cough, dyspnea at rest,wheeezing Breast: no concerns about lumps GI: Denies nausea, vomiting, diarrhea, constipation, change in bowel habits, abdominal pain, melena, hematochezia GU: Denies penile discharge, ED, urinary flow / outflow problems. No STD concerns. Musculoskeletal: Denies back pain, joint pain Derm: Denies rash, itching Neuro: Denies  paresthesias, frequent falls, frequent headaches Psych: Denies depression, anxiety Endocrine: Denies cold intolerance, heat intolerance, polydipsia Heme: Denies enlarged lymph nodes Allergy: No hayfever  Objective:   BP 128/62 mmHg  Pulse 61  Temp(Src) 98 F (36.7 C) (Oral)  Ht 6' 0.25" (1.835 m)  Wt 212 lb 4 oz (96.276 kg)  BMI 28.59 kg/m2  The patient completed a fall screen and PHQ-2 and PHQ-9 if necessary, which is   documented in the EHR. The CMA/LPN/RN who assisted the patient verbally completed with them and documented results in [facility] link EHR.   Hearing Screening   Method: Audiometry   125Hz 250Hz 500Hz 1000Hz 2000Hz 4000Hz 8000Hz  Right ear:   40 40 25 0   Left ear:   40 40 25 0     Visual Acuity Screening   Right eye Left eye Both eyes  Without correction:     With  correction: 20/20 20/13 20/15    GEN: well developed, well nourished, no acute distress Eyes: conjunctiva and lids normal, PERRLA, EOMI ENT: TM clear, nares clear, oral exam WNL Neck: supple, no lymphadenopathy, no thyromegaly, no JVD Pulm: clear to auscultation and percussion, respiratory effort normal CV: regular rate and rhythm, S1-S2, no murmur, rub or gallop, no bruits, peripheral pulses normal and symmetric, no cyanosis, clubbing, edema or varicosities GI: soft, non-tender; no hepatosplenomegaly, masses; active bowel sounds all quadrants GU: no hernia, testicular mass, penile discharge Lymph: no cervical, axillary or inguinal adenopathy MSK: gait normal, muscle tone and strength WNL, no joint swelling, effusions, discoloration, crepitus  SKIN: clear, good turgor, color WNL, no rashes, lesions, or ulcerations Neuro: normal mental status, normal strength, sensation, and motion Psych: alert; oriented to person, place and time, normally interactive and not anxious or depressed in appearance.  All labs reviewed with patient.  Lipids:    Component Value Date/Time   CHOL 170 12/31/2014 0905   TRIG 94.0 12/31/2014 0905   HDL 39.80 12/31/2014 0905   LDLDIRECT 139.1 10/23/2010 0929   VLDL 18.8 12/31/2014 0905   CHOLHDL 4 12/31/2014 0905   CBC: CBC Latest Ref Rng 12/31/2014 12/20/2013 12/19/2012  WBC 4.0 - 10.5 K/uL 7.1 7.8 6.7  Hemoglobin 13.0 - 17.0 g/dL 14.2 13.5 13.7  Hematocrit 39.0 - 52.0 % 42.7 40.5 40.8  Platelets 150.0 - 400.0 K/uL 235.0 229.0 226.0    Basic Metabolic Panel:    Component Value Date/Time   NA 141 12/31/2014 0905   K 4.5 12/31/2014 0905   CL 104 12/31/2014 0905   CO2 31 12/31/2014 0905   BUN 12 12/31/2014 0905   CREATININE 0.92 12/31/2014 0905   GLUCOSE 89 12/31/2014 0905   CALCIUM 9.6 12/31/2014 0905   Hepatic Function Latest Ref Rng 12/31/2014 12/20/2013 12/19/2012  Total Protein 6.0 - 8.3 g/dL 7.5 7.8 7.6  Albumin 3.5 - 5.2 g/dL 4.1 4.1 4.2  AST 0  - 37 U/L 14 21 19  ALT 0 - 53 U/L 13 19 19  Alk Phosphatase 39 - 117 U/L 65 71 59  Total Bilirubin 0.2 - 1.2 mg/dL 0.8 1.1 1.0  Bilirubin, Direct 0.0 - 0.3 mg/dL 0.2 0.1 0.2    Lab Results  Component Value Date   TSH 3.76 12/20/2013   Lab Results  Component Value Date   PSA 0.82 12/31/2014   PSA 0.91 12/20/2013   PSA 0.78 12/19/2012    Assessment and Plan:   Health care maintenance  Need for prophylactic vaccination and inoculation against influenza - Plan: Flu Vaccine QUAD 36+ mos IM  Health Maintenance Exam: The patient's preventative maintenance and recommended screening tests for an annual wellness exam were reviewed in full today. Brought up to date unless services declined.  Counselled on the importance of diet, exercise, and its role in overall health and mortality. The patient's FH and SH was reviewed, including their home life, tobacco status, and drug and alcohol status.  I have personally reviewed the Medicare Annual   Wellness questionnaire and have noted 1. The patient's medical and social history 2. Their use of alcohol, tobacco or illicit drugs 3. Their current medications and supplements 4. The patient's functional ability including ADL's, fall risks, home safety risks and hearing or visual             impairment. 5. Diet and physical activities 6. Evidence for depression or mood disorders 7. Reviewed Updated provider list, see scanned forms and [facility] Snapshot.   The patients weight, height, BMI and visual acuity have been recorded in the chart I have made referrals, counseling and provided education to the patient based review of the above and I have provided the pt with a written personalized care plan for preventive services.  I have provided the patient with a copy of your personalized plan for preventive services. Instructed to take the time to review along with their updated medication list.  Follow-up: No Follow-up on file. Or follow-up in 1 year for  complete physical examination  New Prescriptions   No medications on file   Modified Medications   Modified Medication Previous Medication   PRAVASTATIN (PRAVACHOL) 40 MG TABLET pravastatin (PRAVACHOL) 40 MG tablet      TAKE ONE TABLET BY MOUTH ONCE DAILY    TAKE ONE TABLET BY MOUTH ONCE DAILY   Orders Placed This Encounter  Procedures  . Flu Vaccine QUAD 36+ mos IM    Signed,  Tayli Buch T. Biddie Sebek, MD   Patient's Medications  New Prescriptions   No medications on file  Previous Medications   ASPIRIN 81 MG TABLET    Take 81 mg by mouth daily.     CALCIUM CARBONATE (CALCIUM 600 PO)    Take 1 tablet by mouth daily.   GLUCOSAMINE CHONDROITIN COMPLX PO    Take 2 tablets by mouth daily.   IBUPROFEN (ADVIL,MOTRIN) 200 MG TABLET    Take 200 mg by mouth every 6 (six) hours as needed.   MULTIPLE VITAMIN (MULTIVITAMIN) TABLET    Take 1 tablet by mouth daily.    Modified Medications   Modified Medication Previous Medication   PRAVASTATIN (PRAVACHOL) 40 MG TABLET pravastatin (PRAVACHOL) 40 MG tablet      TAKE ONE TABLET BY MOUTH ONCE DAILY    TAKE ONE TABLET BY MOUTH ONCE DAILY  Discontinued Medications   No medications on file

## 2015-01-07 NOTE — Progress Notes (Signed)
Pre visit review using our clinic review tool, if applicable. No additional management support is needed unless otherwise documented below in the visit note. 

## 2015-04-29 DIAGNOSIS — R69 Illness, unspecified: Secondary | ICD-10-CM | POA: Diagnosis not present

## 2015-09-05 ENCOUNTER — Ambulatory Visit (INDEPENDENT_AMBULATORY_CARE_PROVIDER_SITE_OTHER): Payer: Medicare HMO

## 2015-09-05 ENCOUNTER — Other Ambulatory Visit: Payer: Self-pay

## 2015-09-05 DIAGNOSIS — Z125 Encounter for screening for malignant neoplasm of prostate: Secondary | ICD-10-CM

## 2015-09-05 DIAGNOSIS — I351 Nonrheumatic aortic (valve) insufficiency: Secondary | ICD-10-CM | POA: Diagnosis not present

## 2015-09-05 DIAGNOSIS — Z79899 Other long term (current) drug therapy: Secondary | ICD-10-CM

## 2015-09-05 DIAGNOSIS — I77810 Thoracic aortic ectasia: Secondary | ICD-10-CM

## 2015-09-05 DIAGNOSIS — E7849 Other hyperlipidemia: Secondary | ICD-10-CM

## 2015-09-05 DIAGNOSIS — Z1159 Encounter for screening for other viral diseases: Secondary | ICD-10-CM

## 2015-09-05 LAB — ECHOCARDIOGRAM COMPLETE
AOPV: 0.77 m/s
AV Area VTI index: 1.64 cm2/m2
AV Area VTI: 4.09 cm2
AV Mean grad: 7 mmHg
AV VEL mean LVOT/AV: 0.72
AV area mean vel ind: 1.74 cm2/m2
AVA: 3.6 cm2
AVAREAMEANV: 3.8 cm2
AVPG: 13 mmHg
AVPHT: 253 ms
AVPKVEL: 178 cm/s
Ao-asc: 39 cm
CHL CUP AV PEAK INDEX: 1.87
CHL CUP AV VALUE AREA INDEX: 1.64
CHL CUP AV VEL: 3.6
CHL CUP DOP CALC LVOT VTI: 28.3 cm
CHL CUP MV DEC (S): 120
CHL CUP STROKE VOLUME: 95 mL
DOP CAL AO MEAN VELOCITY: 124 cm/s
E decel time: 120 msec
E/e' ratio: 5.21
FS: 34 % (ref 28–44)
IV/PV OW: 0.88
LA ID, A-P, ES: 46 mm
LA diam index: 2.1 cm/m2
LA vol index: 33 mL/m2
LA vol: 72.3 mL
LAVOLA4C: 69.4 mL
LDCA: 5.31 cm2
LEFT ATRIUM END SYS DIAM: 46 mm
LV E/e'average: 5.21
LV SIMPSON'S DISK: 51
LV dias vol index: 84 mL/m2
LV dias vol: 185 mL — AB (ref 62–150)
LV sys vol: 90 mL — AB (ref 21–61)
LVEEMED: 5.21
LVELAT: 13.6 cm/s
LVOT SV: 150 mL
LVOT diameter: 26 mm
LVOT peak VTI: 0.68 cm
LVOT peak grad rest: 8 mmHg
LVOTPV: 137 cm/s
LVSYSVOLIN: 41 mL/m2
MV Peak grad: 2 mmHg
MVPKAVEL: 81 m/s
MVPKEVEL: 70.8 m/s
PW: 10.1 mm — AB (ref 0.6–1.1)
TAPSE: 18 mm
TDI e' lateral: 13.6
TDI e' medial: 9.9
VTI: 41.7 cm

## 2015-10-16 ENCOUNTER — Ambulatory Visit (INDEPENDENT_AMBULATORY_CARE_PROVIDER_SITE_OTHER): Payer: Medicare HMO | Admitting: Cardiovascular Disease

## 2015-10-16 ENCOUNTER — Encounter: Payer: Self-pay | Admitting: Cardiovascular Disease

## 2015-10-16 VITALS — BP 130/60 | HR 60 | Ht 72.0 in | Wt 212.5 lb

## 2015-10-16 DIAGNOSIS — I351 Nonrheumatic aortic (valve) insufficiency: Secondary | ICD-10-CM

## 2015-10-16 DIAGNOSIS — I712 Thoracic aortic aneurysm, without rupture: Secondary | ICD-10-CM | POA: Diagnosis not present

## 2015-10-16 DIAGNOSIS — E7800 Pure hypercholesterolemia, unspecified: Secondary | ICD-10-CM

## 2015-10-16 DIAGNOSIS — I7121 Aneurysm of the ascending aorta, without rupture: Secondary | ICD-10-CM

## 2015-10-16 NOTE — Patient Instructions (Addendum)
Medication Instructions:   No new medications   Testing/Procedures:   Research CT coronary calcium score  $150  Repeat echo every 2 years   Follow-Up: It was a pleasure seeing you in the office today. Please call us if you have new issues that need to be addressed before your next appt.  [phone]  Your physician wants you to follow-up in: 12 months.  You will receive a reminder letter in the mail two months in advance. If you don't receive a letter, please call our office to schedule the follow-up appointment.  If you need a refill on your cardiac medications before your next appointment, please call your pharmacy.

## 2015-10-16 NOTE — Progress Notes (Signed)
Cardiology Office Note  Date:  10/16/2015   ID:  Corey Williams, DOB Nov 27, 1945, MRN [id number]  PCP:  Owens Loffler, MD   Chief Complaint  Patient presents with  . Other    1 yr f/u. Meds reviewed verbally with pt.    HPI:  Corey Williams is a very pleasant 70 year old gentleman with history of hyperlipidemia with history of moderate aortic valve insufficiency, unable to exclude bicuspid aortic valve, mildly dilated Ascending aorta on echocardiogram in 2015 who presents for follow-up of his aortic valve regurgitation.  In general he reports that he is doing well.  Denies any symptoms of shortness of breath or chest tightness with exertion.  Continues to stay very active, works in his garden  denies any lightheadedness or dizziness.  No lower extremity edema. no new symptoms  Walks three hours a day  EKG on today's visit shows no sinus rhythm with rate 60 bpm, T-wave abnormality inferior and lateral leads  Recent echocardiogram reviewed with him in detail as below Relatively stable Echo 09/2015 Left ventricle: The cavity size was moderately dilated. Systolic   function was normal. The estimated ejection fraction was in the   range of 55% to 60%. Doppler parameters are consistent with   abnormal left ventricular relaxation (grade 1 diastolic   dysfunction). - Aortic valve: Poorly visualized. There was moderate   regurgitation. - Aorta: The aortic root was mild to moderately dilated, 4.2 cm - Ascending aorta: The ascending aorta was mildly dilated, 3.9 cm    PMH:   has a past medical history of Heart murmur; Hereditary essential tremor (12/28/2013); and Hyperlipidemia.  PSH:    Past Surgical History:  Procedure Laterality Date  . no prior surgery      Current Outpatient Prescriptions  Medication Sig Dispense Refill  . aspirin 81 MG tablet Take 81 mg by mouth daily.      . Calcium Carbonate (CALCIUM 600 PO) Take 1 tablet by mouth daily.    Marland Kitchen GLUCOSAMINE CHONDROITIN COMPLX PO  Take 2 tablets by mouth daily.    Marland Kitchen ibuprofen (ADVIL,MOTRIN) 200 MG tablet Take 200 mg by mouth every 6 (six) hours as needed.    . Multiple Vitamin (MULTIVITAMIN) tablet Take 1 tablet by mouth daily.      . pravastatin (PRAVACHOL) 40 MG tablet TAKE ONE TABLET BY MOUTH ONCE DAILY 90 tablet 3   No current facility-administered medications for this visit.      Allergies:   Review of patient's allergies indicates no known allergies.   Social History:  The patient  reports that he quit smoking about 41 years ago. He has never used smokeless tobacco. He reports that he drinks about 3.0 oz of alcohol per week . He reports that he does not use drugs.   Family History:   family history includes Stroke in his father.    Review of Systems: Review of Systems  Constitutional: Negative.   Respiratory: Negative.   Cardiovascular: Negative.   Gastrointestinal: Negative.   Musculoskeletal: Negative.   Neurological: Negative.   Psychiatric/Behavioral: Negative.   All other systems reviewed and are negative.    PHYSICAL EXAM: VS:  BP 130/60 (BP Location: Left Arm, Patient Position: Sitting, Cuff Size: Normal)   Pulse 60   Ht 6' (1.829 m)   Wt 212 lb 8 oz (96.4 kg)   BMI 28.82 kg/m  , BMI Body mass index is 28.82 kg/m. GEN: Well nourished, well developed, in no acute distress  HEENT: normal  Neck:  no JVD, carotid bruits, or masses Cardiac: RRR; no murmurs, rubs, or gallops,no edema  Respiratory:  clear to auscultation bilaterally, normal work of breathing GI: soft, nontender, nondistended, + BS MS: no deformity or atrophy  Skin: warm and dry, no rash Neuro:  Strength and sensation are intact Psych: euthymic mood, full affect    Recent Labs: 12/31/2014: ALT 13; BUN 12; Creatinine, Ser 0.92; Hemoglobin 14.2; Platelets 235.0; Potassium 4.5; Sodium 141    Lipid Panel Lab Results  Component Value Date   CHOL 170 12/31/2014   HDL 39.80 12/31/2014   LDLCALC 111 (H) 12/31/2014   TRIG  94.0 12/31/2014      Wt Readings from Last 3 Encounters:  10/16/15 212 lb 8 oz (96.4 kg)  01/07/15 212 lb 4 oz (96.3 kg)  10/16/14 213 lb 12 oz (97 kg)       ASSESSMENT AND PLAN:  Aortic valve regurgitation - Plan: EKG 12-Lead Stable moderate AI, denies any symptoms of shortness of breath No significant LV dilatation noted, normal EF We'll continue to monitor  HYPERCHOLESTEROLEMIA - Plan: EKG 12-Lead Cholesterol is at goal on the current lipid regimen. No changes to the medications were made. We did spend sometimes cussing CT coronary calcium scoring for risk stratification He will call us if interested  Aneurysm of ascending aorta [facility]) Long discussion concerning his aorta aneurysm. Discussed progression, the fact that his studies are relatively stable since 2015 Would recommend repeat study in 2 years time   Total encounter time more than 25 minutes  Greater than 50% was spent in counseling and coordination of care with the patient   Disposition:   F/U  6 months   Orders Placed This Encounter  Procedures  . EKG 12-Lead     Signed, Corey Williams, M.D., Ph.D. 10/16/2015  [place], [place]

## 2016-01-01 ENCOUNTER — Other Ambulatory Visit (INDEPENDENT_AMBULATORY_CARE_PROVIDER_SITE_OTHER): Payer: Medicare HMO

## 2016-01-01 DIAGNOSIS — E784 Other hyperlipidemia: Secondary | ICD-10-CM | POA: Diagnosis not present

## 2016-01-01 DIAGNOSIS — Z125 Encounter for screening for malignant neoplasm of prostate: Secondary | ICD-10-CM | POA: Diagnosis not present

## 2016-01-01 DIAGNOSIS — Z79899 Other long term (current) drug therapy: Secondary | ICD-10-CM | POA: Diagnosis not present

## 2016-01-01 DIAGNOSIS — E7849 Other hyperlipidemia: Secondary | ICD-10-CM

## 2016-01-01 DIAGNOSIS — Z1159 Encounter for screening for other viral diseases: Secondary | ICD-10-CM

## 2016-01-01 LAB — BASIC METABOLIC PANEL
BUN: 15 mg/dL (ref 6–23)
CALCIUM: 9.6 mg/dL (ref 8.4–10.5)
CO2: 30 meq/L (ref 19–32)
Chloride: 104 mEq/L (ref 96–112)
Creatinine, Ser: 0.97 mg/dL (ref 0.40–1.50)
GFR: 81.28 mL/min (ref >60.00)
GLUCOSE: 89 mg/dL (ref 70–99)
Potassium: 5.1 mEq/L (ref 3.5–5.1)
Sodium: 142 mEq/L (ref 135–145)

## 2016-01-01 LAB — HEPATIC FUNCTION PANEL
ALK PHOS: 63 U/L (ref 39–117)
ALT: 19 U/L (ref 0–53)
AST: 20 U/L (ref 0–37)
Albumin: 3.8 g/dL (ref 3.5–5.2)
BILIRUBIN DIRECT: 0.1 mg/dL (ref 0.0–0.3)
BILIRUBIN TOTAL: 0.7 mg/dL (ref 0.2–1.2)
Total Protein: 7.4 g/dL (ref 6.0–8.3)

## 2016-01-01 LAB — CBC WITH DIFFERENTIAL/PLATELET
BASOS ABS: 0 10*3/uL (ref 0.0–0.1)
Basophils Relative: 0.7 % (ref 0.0–3.0)
Eosinophils Absolute: 0.3 10*3/uL (ref 0.0–0.7)
Eosinophils Relative: 5.9 % — ABNORMAL HIGH (ref 0.0–5.0)
HCT: 42 % (ref 39.0–52.0)
Hemoglobin: 14 g/dL (ref 13.0–17.0)
LYMPHS ABS: 1.5 10*3/uL (ref 0.7–4.0)
Lymphocytes Relative: 26.2 % (ref 12.0–46.0)
MCHC: 33.3 g/dL (ref 30.0–36.0)
MCV: 95 fl (ref 78.0–100.0)
MONO ABS: 0.6 10*3/uL (ref 0.1–1.0)
MONOS PCT: 9.9 % (ref 3.0–12.0)
NEUTROS ABS: 3.4 10*3/uL (ref 1.4–7.7)
NEUTROS PCT: 57.3 % (ref 43.0–77.0)
PLATELETS: 243 10*3/uL (ref 150.0–400.0)
RBC: 4.42 Mil/uL (ref 4.22–5.81)
RDW: 13.2 % (ref 11.5–15.5)
WBC: 5.9 10*3/uL (ref 4.0–10.5)

## 2016-01-01 LAB — LIPID PANEL
CHOLESTEROL: 173 mg/dL (ref 0–200)
HDL: 38.9 mg/dL — ABNORMAL LOW (ref >39.00)
LDL Cholesterol: 117 mg/dL — ABNORMAL HIGH (ref 0–99)
NONHDL: 133.99
Total CHOL/HDL Ratio: 4
Triglycerides: 84 mg/dL (ref 0.0–149.0)
VLDL: 16.8 mg/dL (ref 0.0–40.0)

## 2016-01-01 LAB — PSA, MEDICARE: PSA: 0.88 ng/mL (ref 0.10–4.00)

## 2016-01-02 LAB — HEPATITIS C ANTIBODY: HCV AB: NEGATIVE

## 2016-01-08 ENCOUNTER — Encounter: Payer: Self-pay | Admitting: Family Medicine

## 2016-01-08 ENCOUNTER — Ambulatory Visit (INDEPENDENT_AMBULATORY_CARE_PROVIDER_SITE_OTHER): Payer: Medicare HMO | Admitting: Family Medicine

## 2016-01-08 VITALS — BP 130/62 | HR 65 | Temp 98.4°F | Ht 72.0 in | Wt 213.0 lb

## 2016-01-08 DIAGNOSIS — Z0000 Encounter for general adult medical examination without abnormal findings: Secondary | ICD-10-CM | POA: Diagnosis not present

## 2016-01-08 DIAGNOSIS — Z23 Encounter for immunization: Secondary | ICD-10-CM | POA: Diagnosis not present

## 2016-01-08 NOTE — Progress Notes (Signed)
Pre visit review using our clinic review tool, if applicable. No additional management support is needed unless otherwise documented below in the visit note. 

## 2016-01-08 NOTE — Progress Notes (Signed)
Dr. Frederico Hamman T. Copland, MD, [facility] [place], [address] Phone: [phone] Fax: [phone]  01/08/2016  Patient: Corey Williams, MRN: [id number], DOB: Oct 30, 1945, 70 y.o.  Primary Physician:  Owens Loffler, MD   Chief Complaint  Patient presents with  . Medicare Wellness   Subjective:   Corey Williams is a 70 y.o. pleasant patient who presents for a medicare wellness examination and full physical:  Preventative Health Maintenance Visit:  Health Maintenance Summary Reviewed and updated, unless pt declines services.  Tobacco History Reviewed. Alcohol: No concerns, no excessive use Exercise Habits: regular walker,  rec at least 30 mins 5 times a week STD concerns: no risk or activity to increase risk Drug Use: None Encouraged self-testicular check  Doing well - still walking every day Still mowing lawn  Health Maintenance  Topic Date Due  . TETANUS/TDAP  01/07/2017 (Originally 10/22/1964)  . COLONOSCOPY  02/14/2022  . INFLUENZA VACCINE  Addressed  . ZOSTAVAX  Addressed  . Hepatitis C Screening  Completed  . PNA vac Low Risk Adult  Completed    Immunization History  Administered Date(s) Administered  . Influenza,inj,Quad PF,36+ Mos 01/17/2013, 12/27/2013, 01/07/2015, 01/08/2016  . Pneumococcal Conjugate-13 12/27/2013  . Pneumococcal Polysaccharide-23 12/22/2012    Patient Active Problem List   Diagnosis Date Noted  . Hereditary essential tremor 12/28/2013  . Aortic valve regurgitation 10/13/2013  . Aneurysm of ascending aorta ([place]) 10/13/2013  . HYPERCHOLESTEROLEMIA 06/05/2010  . ACTINIC KERATOSIS, HEAD 06/05/2010   Past Medical History:  Diagnosis Date  . Heart murmur   . Hereditary essential tremor 12/28/2013  . Hyperlipidemia    Past Surgical History:  Procedure Laterality Date  . no prior surgery     Social History   Social History  . Marital status: Married    Spouse name: N/A  .  Number of children: N/A  . Years of education: N/A   Occupational History  . sales Retired   Social History Main Topics  . Smoking status: Former Smoker    Quit date: 02/01/1974  . Smokeless tobacco: Never Used  . Alcohol use 3.0 oz/week    5 Cans of beer per week     Comment: occ  . Drug use: No  . Sexual activity: Not on file   Other Topics Concern  . Not on file   Social History Narrative   Regular exercise--yes   Family History  Problem Relation Age of Onset  . Stroke Father   . Colon cancer Neg Hx   . Stomach cancer Neg Hx    No Known Allergies  Medication list has been reviewed and updated.   General: Denies fever, chills, sweats. No significant weight loss. Eyes: Denies blurring,significant itching ENT: Denies earache, sore throat, and hoarseness. Cardiovascular: Denies chest pains, palpitations, dyspnea on exertion Respiratory: Denies cough, dyspnea at rest,wheeezing Breast: no concerns about lumps GI: Denies nausea, vomiting, diarrhea, constipation, change in bowel habits, abdominal pain, melena, hematochezia GU: Denies penile discharge, ED, urinary flow / outflow problems. No STD concerns. Musculoskeletal: Denies back pain, joint pain Derm: Denies rash, itching Neuro: Denies  paresthesias, frequent falls, frequent headaches Psych: Denies depression, anxiety Endocrine: Denies cold intolerance, heat intolerance, polydipsia Heme: Denies enlarged lymph nodes Allergy: No hayfever  Objective:   BP 130/62   Pulse 65   Temp 98.4 F (36.9 C) (Oral)   Ht 6' (1.829 m)   Wt 213 lb (96.6 kg)   BMI  28.89 kg/m   The patient completed a fall screen and PHQ-2 and PHQ-9 if necessary, which is documented in the EHR. The CMA/LPN/RN who assisted the patient verbally completed with them and documented results in [place].   Hearing Screening   Method: Audiometry   125Hz 250Hz 500Hz 1000Hz 2000Hz 3000Hz 4000Hz 6000Hz 8000Hz  Right ear:   40 40 20  0      Left ear:   _0 0      Visual Acuity Screening   Right eye Left eye Both eyes  Without correction:     With correction: 20/20 20/25 20/20    GEN: well developed, well nourished, no acute distress Eyes: conjunctiva and lids normal, PERRLA, EOMI ENT: TM clear, nares clear, oral exam WNL Neck: supple, no lymphadenopathy, no thyromegaly, no JVD Pulm: clear to auscultation and percussion, respiratory effort normal CV: regular rate and rhythm, S1-S2, no murmur, rub or gallop, no bruits, peripheral pulses normal and symmetric, no cyanosis, clubbing, edema or varicosities GI: soft, non-tender; no hepatosplenomegaly, masses; active bowel sounds all quadrants GU: no hernia, testicular mass, penile discharge Lymph: no cervical, axillary or inguinal adenopathy MSK: gait normal, muscle tone and strength WNL, no joint swelling, effusions, discoloration, crepitus  SKIN: clear, good turgor, color WNL, no rashes, lesions, or ulcerations Neuro: normal mental status, normal strength, sensation, and motion Psych: alert; oriented to person, place and time, normally interactive and not anxious or depressed in appearance.  All labs reviewed with patient.  Lipids:    Component Value Date/Time   CHOL 173 01/01/2016 0825   TRIG 84.0 01/01/2016 0825   HDL 38.90 (L) 01/01/2016 0825   LDLDIRECT 139.1 10/23/2010 0929   VLDL 16.8 01/01/2016 0825   CHOLHDL 4 01/01/2016 0825   CBC: CBC Latest Ref Rng & Units 01/01/2016 12/31/2014 12/20/2013  WBC 4.0 - 10.5 K/uL 5.9 7.1 7.8  Hemoglobin 13.0 - 17.0 g/dL 14.0 14.2 13.5  Hematocrit 39.0 - 52.0 % 42.0 42.7 40.5  Platelets 150.0 - 400.0 K/uL 243.0 235.0 734.2    Basic Metabolic Panel:    Component Value Date/Time   NA 142 01/01/2016 0825   K 5.1 01/01/2016 0825   CL 104 01/01/2016 0825   CO2 30 01/01/2016 0825   BUN 15 01/01/2016 0825   CREATININE 0.97 01/01/2016 0825   GLUCOSE 89 01/01/2016 0825   CALCIUM 9.6 01/01/2016 0825   Hepatic Function  Latest Ref Rng & Units 01/01/2016 12/31/2014 12/20/2013  Total Protein 6.0 - 8.3 g/dL 7.4 7.5 7.8  Albumin 3.5 - 5.2 g/dL 3.8 4.1 4.1  AST 0 - 37 U/L _1 ALT 0 - 53 U/L _2 Alk Phosphatase 39 - 117 U/L 63 65 71  Total Bilirubin 0.2 - 1.2 mg/dL 0.7 0.8 1.1  Bilirubin, Direct 0.0 - 0.3 mg/dL 0.1 0.2 0.1    Lab Results  Component Value Date   TSH 3.76 12/20/2013   Lab Results  Component Value Date   PSA 0.88 01/01/2016   PSA 0.82 12/31/2014   PSA 0.91 12/20/2013    Assessment and Plan:   Health care maintenance  Need for prophylactic vaccination and inoculation against influenza - Plan: Flu Vaccine QUAD 36+ mos IM  Doing really well.  Health Maintenance Exam: The patient's preventative maintenance and recommended screening tests for an annual wellness exam were reviewed in full today. Brought up to date unless services declined.  Counselled on the importance of diet, exercise,  and its role in overall health and mortality. The patient's FH and SH was reviewed, including their home life, tobacco status, and drug and alcohol status.  I have personally reviewed the Medicare Annual Wellness questionnaire and have noted 1. The patient's medical and social history 2. Their use of alcohol, tobacco or illicit drugs 3. Their current medications and supplements 4. The patient's functional ability including ADL's, fall risks, home safety risks and hearing or visual             impairment. 5. Diet and physical activities 6. Evidence for depression or mood disorders 7. Reviewed Updated provider list, see scanned forms and [facility] Snapshot.   The patients weight, height, BMI and visual acuity have been recorded in the chart I have made referrals, counseling and provided education to the patient based review of the above and I have provided the pt with a written personalized care plan for preventive services.  I have provided the patient with a copy of your personalized plan for  preventive services. Instructed to take the time to review along with their updated medication list.  Follow-up: No Follow-up on file. Or follow-up in 1 year for complete physical examination  Orders Placed This Encounter  Procedures  . Flu Vaccine QUAD 36+ mos IM    Signed,  Spencer T. Copland, MD   Patient's Medications  New Prescriptions   No medications on file  Previous Medications   ASPIRIN 81 MG TABLET    Take 81 mg by mouth daily.     CALCIUM CARBONATE (CALCIUM 600 PO)    Take 1 tablet by mouth daily.   GLUCOSAMINE CHONDROITIN COMPLX PO    Take 2 tablets by mouth daily.   IBUPROFEN (ADVIL,MOTRIN) 200 MG TABLET    Take 200 mg by mouth every 6 (six) hours as needed.   MULTIPLE VITAMIN (MULTIVITAMIN) TABLET    Take 1 tablet by mouth daily.     PRAVASTATIN (PRAVACHOL) 40 MG TABLET    TAKE ONE TABLET BY MOUTH ONCE DAILY  Modified Medications   No medications on file  Discontinued Medications   No medications on file

## 2016-03-05 DIAGNOSIS — I712 Thoracic aortic aneurysm, without rupture: Secondary | ICD-10-CM | POA: Diagnosis not present

## 2016-03-05 DIAGNOSIS — Z0000 Encounter for general adult medical examination without abnormal findings: Secondary | ICD-10-CM | POA: Diagnosis not present

## 2016-03-05 DIAGNOSIS — Z6830 Body mass index (BMI) 30.0-30.9, adult: Secondary | ICD-10-CM | POA: Diagnosis not present

## 2016-03-05 DIAGNOSIS — E7800 Pure hypercholesterolemia, unspecified: Secondary | ICD-10-CM | POA: Diagnosis not present

## 2016-03-25 ENCOUNTER — Other Ambulatory Visit: Payer: Self-pay | Admitting: Family Medicine

## 2016-04-14 DIAGNOSIS — H35373 Puckering of macula, bilateral: Secondary | ICD-10-CM | POA: Diagnosis not present

## 2016-04-14 DIAGNOSIS — H524 Presbyopia: Secondary | ICD-10-CM | POA: Diagnosis not present

## 2016-04-14 DIAGNOSIS — H25813 Combined forms of age-related cataract, bilateral: Secondary | ICD-10-CM | POA: Diagnosis not present

## 2016-04-14 DIAGNOSIS — R69 Illness, unspecified: Secondary | ICD-10-CM | POA: Diagnosis not present

## 2016-04-14 DIAGNOSIS — H43813 Vitreous degeneration, bilateral: Secondary | ICD-10-CM | POA: Diagnosis not present

## 2016-05-04 DIAGNOSIS — R69 Illness, unspecified: Secondary | ICD-10-CM | POA: Diagnosis not present

## 2016-09-04 ENCOUNTER — Encounter: Payer: Self-pay | Admitting: Family Medicine

## 2016-09-04 ENCOUNTER — Ambulatory Visit (INDEPENDENT_AMBULATORY_CARE_PROVIDER_SITE_OTHER): Payer: Medicare HMO | Admitting: Family Medicine

## 2016-09-04 VITALS — BP 128/70 | HR 61 | Temp 98.1°F | Wt 207.5 lb

## 2016-09-04 DIAGNOSIS — J019 Acute sinusitis, unspecified: Secondary | ICD-10-CM | POA: Diagnosis not present

## 2016-09-04 MED ORDER — AMOXICILLIN 875 MG PO TABS: 875.0000 mg | ORAL_TABLET | Freq: Two times a day (BID) | ORAL | 0 refills | Status: DC

## 2016-09-04 MED ORDER — FLUTICASONE PROPIONATE 50 MCG/ACT NA SUSP: 2.0000 | Freq: Every day | NASAL | 1 refills | Status: DC

## 2016-09-04 NOTE — Assessment & Plan Note (Signed)
Anticipate bacterial sinusitis given duration of symptoms, less likely new-onset allergic rhinitis.  rec treat with amox 7d course, may start flonase (with epistaxis precautions). Update if not improving with treatment as expected. Pt agrees with plan.

## 2016-09-04 NOTE — Patient Instructions (Signed)
Possible sinus infection, possible allergy irritation of nose. Treat with amoxicillin 7 day course, take with food. You can also use flonase nasal steroid for inflammation in nose.  If not improving let us know.

## 2016-09-04 NOTE — Progress Notes (Addendum)
BP 128/70   Pulse 61   Temp 98.1 F (36.7 C) (Oral)   Wt 207 lb 8 oz (94.1 kg)   SpO2 96%   BMI 28.14 kg/m    CC: cough, congestion Subjective:    Patient ID: Corey Williams, male    DOB: [birth year], 71 y.o.   MRN: [id number]  HPI: Corey Williams is a 71 y.o. male presenting on 09/04/2016 for Cough and Nasal Congestion   3 wk h/o nasal congestion with yellow drainage. Worse in the mornings. Mild cough present as well. No wheezing. Possible PNDrainage.   No fevers/chills, ear or tooth pain, headache, ST. He has tried nyquil for this.   No h/o allergies. Mows lawn without trouble. Walks daily.  No h/o asthma. No sick contacts at home. Non smoker.  Relevant past medical, surgical, family and social history reviewed and updated as indicated. Interim medical history since our last visit reviewed. Allergies and medications reviewed and updated. Outpatient Medications Prior to Visit  Medication Sig Dispense Refill  . aspirin 81 MG tablet Take 81 mg by mouth daily.      . Calcium Carbonate (CALCIUM 600 PO) Take 1 tablet by mouth daily.    Marland Kitchen GLUCOSAMINE CHONDROITIN COMPLX PO Take 2 tablets by mouth daily.    Marland Kitchen ibuprofen (ADVIL,MOTRIN) 200 MG tablet Take 200 mg by mouth every 6 (six) hours as needed.    . Multiple Vitamin (MULTIVITAMIN) tablet Take 1 tablet by mouth daily.      . pravastatin (PRAVACHOL) 40 MG tablet TAKE ONE TABLET BY MOUTH ONCE DAILY 90 tablet 3   No facility-administered medications prior to visit.      Per HPI unless specifically indicated in ROS section below Review of Systems     Objective:    BP 128/70   Pulse 61   Temp 98.1 F (36.7 C) (Oral)   Wt 207 lb 8 oz (94.1 kg)   SpO2 96%   BMI 28.14 kg/m   Wt Readings from Last 3 Encounters:  09/04/16 207 lb 8 oz (94.1 kg)  01/08/16 213 lb (96.6 kg)  10/16/15 212 lb 8 oz (96.4 kg)    Physical Exam  Constitutional: He appears well-developed and well-nourished. No distress.  HENT:  Head: Normocephalic  and atraumatic.  Right Ear: Hearing, tympanic membrane, external ear and ear canal normal.  Left Ear: Hearing, tympanic membrane, external ear and ear canal normal.  Nose: Mucosal edema (L nasal mucosal congestion) present. No rhinorrhea. Right sinus exhibits no maxillary sinus tenderness and no frontal sinus tenderness. Left sinus exhibits no maxillary sinus tenderness and no frontal sinus tenderness.  Mouth/Throat: Uvula is midline, oropharynx is clear and moist and mucous membranes are normal. No oropharyngeal exudate, posterior oropharyngeal edema, posterior oropharyngeal erythema or tonsillar abscesses.  Eyes: Conjunctivae and EOM are normal. Pupils are equal, round, and reactive to light. No scleral icterus.  Neck: Normal range of motion. Neck supple.  Cardiovascular: Normal rate, regular rhythm and intact distal pulses.   Murmur (3/6 systolic, chronic) heard. Pulmonary/Chest: Effort normal and breath sounds normal. No respiratory distress. He has no wheezes. He has no rales.  Lymphadenopathy:    He has no cervical adenopathy.  Skin: Skin is warm and dry. No rash noted.  Nursing note and vitals reviewed.     Assessment & Plan:   Problem List Items Addressed This Visit    Acute sinusitis - Primary    Anticipate bacterial sinusitis given duration of symptoms, less likely new-onset  allergic rhinitis.  rec treat with amox 7d course, may start flonase (with epistaxis precautions). Update if not improving with treatment as expected. Pt agrees with plan.       Relevant Medications   amoxicillin (AMOXIL) 875 MG tablet   fluticasone (FLONASE) 50 MCG/ACT nasal spray       Follow up plan: Return if symptoms worsen or fail to improve.  Ria Bush, MD

## 2016-09-19 DIAGNOSIS — Z0000 Encounter for general adult medical examination without abnormal findings: Secondary | ICD-10-CM | POA: Diagnosis not present

## 2016-09-19 DIAGNOSIS — E663 Overweight: Secondary | ICD-10-CM | POA: Diagnosis not present

## 2016-09-19 DIAGNOSIS — Z7982 Long term (current) use of aspirin: Secondary | ICD-10-CM | POA: Diagnosis not present

## 2016-09-19 DIAGNOSIS — Z6827 Body mass index (BMI) 27.0-27.9, adult: Secondary | ICD-10-CM | POA: Diagnosis not present

## 2016-09-19 DIAGNOSIS — E785 Hyperlipidemia, unspecified: Secondary | ICD-10-CM | POA: Diagnosis not present

## 2016-09-19 DIAGNOSIS — G250 Essential tremor: Secondary | ICD-10-CM | POA: Diagnosis not present

## 2016-09-19 DIAGNOSIS — R011 Cardiac murmur, unspecified: Secondary | ICD-10-CM | POA: Diagnosis not present

## 2016-10-14 NOTE — Progress Notes (Signed)
Cardiology Office Note  Date:  10/15/2016   ID:  Corey Williams, DOB 1945/11/25, MRN [id number]  PCP:  Owens Loffler, MD   Chief Complaint  Patient presents with  . other    12 month follow up. Patient states he is doing good. Meds reviewed verbally with patient.     HPI:  [name] is a very pleasant 71 year old gentleman with history of  hyperlipidemia  moderate aortic valve insufficiency,  unable to exclude bicuspid aortic valve,  mildly dilated Ascending aorta on echocardiogram in 2015  who presents for follow-up of his aortic valve regurgitation.  he reports that he is doing well.  Denies any symptoms of shortness of breath or chest tightness with exertion.  Exercises daily walks 3 miles a day Also does other activities works in his garden  denies any lightheadedness, dizziness. No fluid retention, no ankle swelling  no new symptoms   Previous echocardiogram reviewed with him in detail  EKG on today's visit shows no sinus rhythm with rate 63 bpm, T-wave abnormality inferior and lateral leads  echocardiogram reviewed with him in detail as below Echo 09/2015 Left ventricle: The cavity size was moderately dilated. Systolic function was normal. The estimated ejection fraction was in the range of 55% to 60%. Doppler parameters are consistent with abnormal left ventricular relaxation (grade 1 diastolic dysfunction). - Aortic valve: Poorly visualized. There was moderate regurgitation. - Aorta: The aortic root was mild to moderately dilated, 4.2 cm - Ascending aorta: The ascending aorta was mildly dilated, 3.9 cm    PMH:   has a past medical history of Heart murmur; Hereditary essential tremor (12/28/2013); and Hyperlipidemia.  PSH:    Past Surgical History:  Procedure Laterality Date  . no prior surgery      Current Outpatient Prescriptions  Medication Sig Dispense Refill  . amoxicillin (AMOXIL) 875 MG tablet Take 1 tablet (875 mg total) by mouth 2 (two) times daily.  14 tablet 0  . aspirin 81 MG tablet Take 81 mg by mouth daily.      . Calcium Carbonate (CALCIUM 600 PO) Take 1 tablet by mouth daily.    . fluticasone (FLONASE) 50 MCG/ACT nasal spray Place 2 sprays into both nostrils daily. 16 g 1  . GLUCOSAMINE CHONDROITIN COMPLX PO Take 2 tablets by mouth daily.    Marland Kitchen ibuprofen (ADVIL,MOTRIN) 200 MG tablet Take 200 mg by mouth every 6 (six) hours as needed.    . Multiple Vitamin (MULTIVITAMIN) tablet Take 1 tablet by mouth daily.      . pravastatin (PRAVACHOL) 40 MG tablet TAKE ONE TABLET BY MOUTH ONCE DAILY 90 tablet 3   No current facility-administered medications for this visit.      Allergies:   Patient has no known allergies.   Social History:  The patient  reports that he quit smoking about 42 years ago. He has never used smokeless tobacco. He reports that he drinks about 3.0 oz of alcohol per week . He reports that he does not use drugs.   Family History:   family history includes Stroke in his father.    Review of Systems: Review of Systems  Constitutional: Negative.   Respiratory: Negative.   Cardiovascular: Negative.   Gastrointestinal: Negative.   Musculoskeletal: Negative.   Neurological: Negative.   Psychiatric/Behavioral: Negative.   All other systems reviewed and are negative.    PHYSICAL EXAM: VS:  BP (!) 160/60 (BP Location: Left Arm, Patient Position: Sitting, Cuff Size: Normal)   Pulse 63  Ht 6\' 1"  (1.854 m)   Wt 206 lb (93.4 kg)   BMI 27.18 kg/m  , BMI Body mass index is 27.18 kg/m. GEN: Well nourished, well developed, in no acute distress  HEENT: normal  Neck: no JVD, carotid bruits, or masses Cardiac: RRR; 2/6 SEM murmurs, rubs, or gallops,no edema  Respiratory:  clear to auscultation bilaterally, normal work of breathing GI: soft, nontender, nondistended, + BS MS: no deformity or atrophy  Skin: warm and dry, no rash Neuro:  Strength and sensation are intact Psych: euthymic mood, full affect    Recent  Labs: 01/01/2016: ALT 19; BUN 15; Creatinine, Ser 0.97; Hemoglobin 14.0; Platelets 243.0; Potassium 5.1; Sodium 142    Lipid Panel Lab Results  Component Value Date   CHOL 173 01/01/2016   HDL 38.90 (L) 01/01/2016   LDLCALC 117 (H) 01/01/2016   TRIG 84.0 01/01/2016      Wt Readings from Last 3 Encounters:  10/15/16 206 lb (93.4 kg)  09/04/16 207 lb 8 oz (94.1 kg)  01/08/16 213 lb (96.6 kg)       ASSESSMENT AND PLAN:  Aortic valve regurgitation - Plan: EKG 12-Lead Stable moderate AI based on last year's echocardiogram,  denies any symptoms of shortness of breath No significant LV dilatation noted on echo, normal EF We have offered echocardiogram, he prefers to wait until next year as he is asymptomatic  HYPERCHOLESTEROLEMIA - Plan: EKG 12-Lead Cholesterol is at goal on the current lipid regimen. No changes to the medications were made.  Aneurysm of ascending aorta [facility]) Long discussion concerning his aorta aneurysm as on his last clinic visit    relatively stable since 2015 He prefers to repeat echocardiogram next year , order placed    Total encounter time more than 25 minutes  Greater than 50% was spent in counseling and coordination of care with the patient   Disposition:   F/U  12 months   No orders of the defined types were placed in this encounter.    Signed, Esmond Plants, M.D., Ph.D. 10/15/2016  [place], [place]

## 2016-10-15 ENCOUNTER — Ambulatory Visit (INDEPENDENT_AMBULATORY_CARE_PROVIDER_SITE_OTHER): Payer: Medicare HMO | Admitting: Cardiovascular Disease

## 2016-10-15 ENCOUNTER — Encounter: Payer: Self-pay | Admitting: Cardiovascular Disease

## 2016-10-15 VITALS — BP 125/60 | HR 63 | Ht 73.0 in | Wt 206.0 lb

## 2016-10-15 DIAGNOSIS — I7121 Aneurysm of the ascending aorta, without rupture: Secondary | ICD-10-CM

## 2016-10-15 DIAGNOSIS — I351 Nonrheumatic aortic (valve) insufficiency: Secondary | ICD-10-CM

## 2016-10-15 DIAGNOSIS — I712 Thoracic aortic aneurysm, without rupture: Secondary | ICD-10-CM | POA: Diagnosis not present

## 2016-10-15 DIAGNOSIS — E7800 Pure hypercholesterolemia, unspecified: Secondary | ICD-10-CM | POA: Diagnosis not present

## 2016-10-15 NOTE — Patient Instructions (Addendum)
Medication Instructions:   No medication changes made  Labwork:  No new labs needed  Testing/Procedures:  Echo in one year for aortic valve regurgitation and dilated aorta   Follow-Up: It was a pleasure seeing you in the office today. Please call us if you have new issues that need to be addressed before your next appt.  [phone]  Your physician wants you to follow-up in: 12 months after echo You will receive a reminder letter in the mail two months in advance. If you don't receive a letter, please call our office to schedule the follow-up appointment.  If you need a refill on your cardiac medications before your next appointment, please call your pharmacy.

## 2017-01-04 ENCOUNTER — Other Ambulatory Visit (INDEPENDENT_AMBULATORY_CARE_PROVIDER_SITE_OTHER): Payer: Medicare HMO

## 2017-01-04 DIAGNOSIS — Z0000 Encounter for general adult medical examination without abnormal findings: Secondary | ICD-10-CM | POA: Diagnosis not present

## 2017-01-04 LAB — CBC WITH DIFFERENTIAL/PLATELET
BASOS ABS: 0.1 10*3/uL (ref 0.0–0.1)
Basophils Relative: 1.3 % (ref 0.0–3.0)
Eosinophils Absolute: 0.4 10*3/uL (ref 0.0–0.7)
Eosinophils Relative: 8.7 % — ABNORMAL HIGH (ref 0.0–5.0)
HCT: 40.7 % (ref 39.0–52.0)
Hemoglobin: 13.9 g/dL (ref 13.0–17.0)
LYMPHS ABS: 1.2 10*3/uL (ref 0.7–4.0)
Lymphocytes Relative: 24.5 % (ref 12.0–46.0)
MCHC: 34.2 g/dL (ref 30.0–36.0)
MCV: 96.9 fl (ref 78.0–100.0)
MONO ABS: 0.5 10*3/uL (ref 0.1–1.0)
Monocytes Relative: 9.4 % (ref 3.0–12.0)
NEUTROS ABS: 2.8 10*3/uL (ref 1.4–7.7)
NEUTROS PCT: 56.1 % (ref 43.0–77.0)
PLATELETS: 211 10*3/uL (ref 150.0–400.0)
RBC: 4.2 Mil/uL — AB (ref 4.22–5.81)
RDW: 13.1 % (ref 11.5–15.5)
WBC: 5 10*3/uL (ref 4.0–10.5)

## 2017-01-04 LAB — LIPID PANEL
CHOLESTEROL: 161 mg/dL (ref 0–200)
HDL: 41.5 mg/dL (ref >39.00)
LDL Cholesterol: 103 mg/dL — ABNORMAL HIGH (ref 0–99)
NONHDL: 119.16
Total CHOL/HDL Ratio: 4
Triglycerides: 82 mg/dL (ref 0.0–149.0)
VLDL: 16.4 mg/dL (ref 0.0–40.0)

## 2017-01-04 LAB — BASIC METABOLIC PANEL
BUN: 14 mg/dL (ref 6–23)
CALCIUM: 9.6 mg/dL (ref 8.4–10.5)
CO2: 29 meq/L (ref 19–32)
Chloride: 104 mEq/L (ref 96–112)
Creatinine, Ser: 0.93 mg/dL (ref 0.40–1.50)
GFR: 85.08 mL/min (ref >60.00)
GLUCOSE: 93 mg/dL (ref 70–99)
Potassium: 4.5 mEq/L (ref 3.5–5.1)
Sodium: 141 mEq/L (ref 135–145)

## 2017-01-04 LAB — HEPATIC FUNCTION PANEL
ALK PHOS: 58 U/L (ref 39–117)
ALT: 13 U/L (ref 0–53)
AST: 15 U/L (ref 0–37)
Albumin: 4.1 g/dL (ref 3.5–5.2)
BILIRUBIN TOTAL: 0.7 mg/dL (ref 0.2–1.2)
Bilirubin, Direct: 0.1 mg/dL (ref 0.0–0.3)
Total Protein: 7.1 g/dL (ref 6.0–8.3)

## 2017-01-04 LAB — PSA, MEDICARE: PSA: 1.2 ng/mL (ref 0.10–4.00)

## 2017-01-11 ENCOUNTER — Ambulatory Visit (INDEPENDENT_AMBULATORY_CARE_PROVIDER_SITE_OTHER): Payer: Medicare HMO | Admitting: Family Medicine

## 2017-01-11 ENCOUNTER — Encounter: Payer: Self-pay | Admitting: Family Medicine

## 2017-01-11 VITALS — BP 146/60 | HR 67 | Temp 97.8°F | Ht 71.5 in | Wt 211.2 lb

## 2017-01-11 DIAGNOSIS — Z0000 Encounter for general adult medical examination without abnormal findings: Secondary | ICD-10-CM

## 2017-01-11 NOTE — Progress Notes (Signed)
Dr. Frederico Hamman T. Tiesha Marich, MD, [facility] [place], [address] Phone: [phone] Fax: [phone]  01/11/2017  Patient: Corey Williams, MRN: [id number], DOB: Mar 28, 1945, 71 y.o.  Primary Physician:  Owens Loffler, MD   Chief Complaint  Patient presents with  . Medicare Wellness   Subjective:   Corey Williams is a 71 y.o. pleasant patient who presents for a medicare wellness examination:  Preventative Health Maintenance Visit:  Health Maintenance Summary Reviewed and updated, unless pt declines services.  Tobacco History Reviewed. Alcohol: No concerns, no excessive use Exercise Habits: 3 miles daily STD concerns: no risk or activity to increase risk Drug Use: None Encouraged self-testicular check  Health Maintenance  Topic Date Due  . TETANUS/TDAP  10/22/1964  . INFLUENZA VACCINE  06/20/2017 (Originally 10/21/2016)  . COLONOSCOPY  02/14/2022  . Hepatitis C Screening  Completed  . PNA vac Low Risk Adult  Completed    Immunization History  Administered Date(s) Administered  . Influenza,inj,Quad PF,6+ Mos 01/17/2013, 12/27/2013, 01/07/2015, 01/08/2016  . Pneumococcal Conjugate-13 12/27/2013  . Pneumococcal Polysaccharide-23 12/22/2012    Patient Active Problem List   Diagnosis Date Noted  . Hereditary essential tremor 12/28/2013  . Aortic valve regurgitation 10/13/2013  . Aneurysm of ascending aorta ([place]) 10/13/2013  . HYPERCHOLESTEROLEMIA 06/05/2010  . ACTINIC KERATOSIS, HEAD 06/05/2010   Past Medical History:  Diagnosis Date  . Heart murmur   . Hereditary essential tremor 12/28/2013  . Hyperlipidemia    Past Surgical History:  Procedure Laterality Date  . no prior surgery     Social History   Social History  . Marital status: Married    Spouse name: N/A  . Number of children: N/A  . Years of education: N/A   Occupational History  . sales Retired   Social History Main Topics  . Smoking  status: Former Smoker    Quit date: 02/01/1974  . Smokeless tobacco: Never Used  . Alcohol use 3.0 oz/week    5 Cans of beer per week     Comment: occ  . Drug use: No  . Sexual activity: Not on file   Other Topics Concern  . Not on file   Social History Narrative   Regular exercise--yes   Family History  Problem Relation Age of Onset  . Stroke Father   . Colon cancer Neg Hx   . Stomach cancer Neg Hx    No Known Allergies  Medication list has been reviewed and updated.   General: Denies fever, chills, sweats. No significant weight loss. Eyes: Denies blurring,significant itching ENT: Denies earache, sore throat, and hoarseness. Cardiovascular: Denies chest pains, palpitations, dyspnea on exertion Respiratory: Denies cough, dyspnea at rest,wheeezing Breast: no concerns about lumps GI: Denies nausea, vomiting, diarrhea, constipation, change in bowel habits, abdominal pain, melena, hematochezia GU: Denies penile discharge, ED, urinary flow / outflow problems. No STD concerns. Musculoskeletal: Denies back pain, joint pain Derm: Denies rash, itching Neuro: Denies  paresthesias, frequent falls, frequent headaches Psych: Denies depression, anxiety Endocrine: Denies cold intolerance, heat intolerance, polydipsia Heme: Denies enlarged lymph nodes Allergy: No hayfever  Objective:   BP (!) 146/60   Pulse 67   Temp 97.8 F (36.6 C) (Oral)   Ht 5' 11.5" (1.816 m)   Wt 211 lb 4 oz (95.8 kg)   BMI 29.05 kg/m   The patient completed a fall screen and PHQ-2 and PHQ-9 if necessary, which is documented in the EHR. The CMA/LPN/RN  who assisted the patient verbally completed with them and documented results in [place].   Hearing Screening   Method: Audiometry   125Hz  250Hz  500Hz  1000Hz  2000Hz  3000Hz  4000Hz  6000Hz  8000Hz   Right ear:   25 20 20   0    Left ear:   25 20 25   40      Visual Acuity Screening   Right eye Left eye Both eyes  Without correction:     With  correction: 20/20 20/20 20/20     GEN: well developed, well nourished, no acute distress Eyes: conjunctiva and lids normal, PERRLA, EOMI ENT: TM clear, nares clear, oral exam WNL Neck: supple, no lymphadenopathy, no thyromegaly, no JVD Pulm: clear to auscultation and percussion, respiratory effort normal CV: regular rate and rhythm, S1-S2, no murmur, rub or gallop, no bruits, peripheral pulses normal and symmetric, no cyanosis, clubbing, edema or varicosities GI: soft, non-tender; no hepatosplenomegaly, masses; active bowel sounds all quadrants GU: no hernia, testicular mass, penile discharge Lymph: no cervical, axillary or inguinal adenopathy MSK: gait normal, muscle tone and strength WNL, no joint swelling, effusions, discoloration, crepitus  SKIN: clear, good turgor, color WNL, no rashes, lesions, or ulcerations Neuro: normal mental status, normal strength, sensation, and motion Psych: alert; oriented to person, place and time, normally interactive and not anxious or depressed in appearance.  All labs reviewed with patient.  Lipids:    Component Value Date/Time   CHOL 161 01/04/2017 0800   TRIG 82.0 01/04/2017 0800   HDL 41.50 01/04/2017 0800   LDLDIRECT 139.1 10/23/2010 0929   VLDL 16.4 01/04/2017 0800   CHOLHDL 4 01/04/2017 0800   CBC: CBC Latest Ref Rng & Units 01/04/2017 01/01/2016 12/31/2014  WBC 4.0 - 10.5 K/uL 5.0 5.9 7.1  Hemoglobin 13.0 - 17.0 g/dL 13.9 14.0 14.2  Hematocrit 39.0 - 52.0 % 40.7 42.0 42.7  Platelets 150.0 - 400.0 K/uL 211.0 243.0 841.6    Basic Metabolic Panel:    Component Value Date/Time   NA 141 01/04/2017 0800   K 4.5 01/04/2017 0800   CL 104 01/04/2017 0800   CO2 29 01/04/2017 0800   BUN 14 01/04/2017 0800   CREATININE 0.93 01/04/2017 0800   GLUCOSE 93 01/04/2017 0800   CALCIUM 9.6 01/04/2017 0800   Hepatic Function Latest Ref Rng & Units 01/04/2017 01/01/2016 12/31/2014  Total Protein 6.0 - 8.3 g/dL 7.1 7.4 7.5  Albumin 3.5 - 5.2 g/dL  4.1 3.8 4.1  AST 0 - 37 U/L 15 20 14   ALT 0 - 53 U/L 13 19 13   Alk Phosphatase 39 - 117 U/L 58 63 65  Total Bilirubin 0.2 - 1.2 mg/dL 0.7 0.7 0.8  Bilirubin, Direct 0.0 - 0.3 mg/dL 0.1 0.1 0.2    Lab Results  Component Value Date   TSH 3.76 12/20/2013   Lab Results  Component Value Date   PSA 1.20 01/04/2017   PSA 0.88 01/01/2016   PSA 0.82 12/31/2014    Assessment and Plan:   Health care maintenance  Doing great  Health Maintenance Exam: The patient's preventative maintenance and recommended screening tests for an annual wellness exam were reviewed in full today. Brought up to date unless services declined.  Counselled on the importance of diet, exercise, and its role in overall health and mortality. The patient's FH and SH was reviewed, including their home life, tobacco status, and drug and alcohol status.  Follow-up in 1 year for physical exam or additional follow-up below.  I have personally reviewed the  Medicare Annual Wellness questionnaire and have noted 1. The patient's medical and social history 2. Their use of alcohol, tobacco or illicit drugs 3. Their current medications and supplements 4. The patient's functional ability including ADL's, fall risks, home safety risks and hearing or visual             impairment. 5. Diet and physical activities 6. Evidence for depression or mood disorders 7. Reviewed Updated provider list, see scanned forms and [facility] Snapshot.   The patients weight, height, BMI and visual acuity have been recorded in the chart I have made referrals, counseling and provided education to the patient based review of the above and I have provided the pt with a written personalized care plan for preventive services.  I have provided the patient with a copy of your personalized plan for preventive services. Instructed to take the time to review along with their updated medication list.  Follow-up: No Follow-up on file. Or follow-up in 1 year if  not noted.  Signed,  Maud Deed. Nylia Gavina, MD   Allergies as of 01/11/2017   No Known Allergies     Medication List       Accurate as of 01/11/17  9:11 AM. Always use your most recent med list.          amoxicillin 875 MG tablet Commonly known as:  AMOXIL Take 1 tablet (875 mg total) by mouth 2 (two) times daily.   aspirin 81 MG tablet Take 81 mg by mouth daily.   CALCIUM 600 PO Take 1 tablet by mouth daily.   fluticasone 50 MCG/ACT nasal spray Commonly known as:  FLONASE Place 2 sprays into both nostrils daily.   GLUCOSAMINE CHONDROITIN COMPLX PO Take 2 tablets by mouth daily.   ibuprofen 200 MG tablet Commonly known as:  ADVIL,MOTRIN Take 200 mg by mouth every 6 (six) hours as needed.   multivitamin tablet Take 1 tablet by mouth daily.   pravastatin 40 MG tablet Commonly known as:  PRAVACHOL TAKE ONE TABLET BY MOUTH ONCE DAILY

## 2017-04-02 ENCOUNTER — Other Ambulatory Visit: Payer: Self-pay | Admitting: Family Medicine

## 2017-05-10 DIAGNOSIS — R69 Illness, unspecified: Secondary | ICD-10-CM | POA: Diagnosis not present

## 2017-06-03 DIAGNOSIS — R69 Illness, unspecified: Secondary | ICD-10-CM | POA: Diagnosis not present

## 2017-09-21 ENCOUNTER — Other Ambulatory Visit: Payer: Self-pay | Admitting: Cardiovascular Disease

## 2017-09-21 DIAGNOSIS — I712 Thoracic aortic aneurysm, without rupture: Secondary | ICD-10-CM

## 2017-09-21 DIAGNOSIS — I7121 Aneurysm of the ascending aorta, without rupture: Secondary | ICD-10-CM

## 2017-09-21 DIAGNOSIS — I351 Nonrheumatic aortic (valve) insufficiency: Secondary | ICD-10-CM

## 2017-10-15 ENCOUNTER — Ambulatory Visit (INDEPENDENT_AMBULATORY_CARE_PROVIDER_SITE_OTHER): Payer: Medicare HMO

## 2017-10-15 ENCOUNTER — Other Ambulatory Visit: Payer: Self-pay

## 2017-10-15 DIAGNOSIS — I7121 Aneurysm of the ascending aorta, without rupture: Secondary | ICD-10-CM

## 2017-10-15 DIAGNOSIS — I712 Thoracic aortic aneurysm, without rupture: Secondary | ICD-10-CM | POA: Diagnosis not present

## 2017-10-15 DIAGNOSIS — I351 Nonrheumatic aortic (valve) insufficiency: Secondary | ICD-10-CM

## 2017-10-16 NOTE — Progress Notes (Deleted)
Cardiology Office Note  Date:  10/16/2017   ID:  BYAN POPLASKI, DOB 1945-08-09, MRN [id number]  PCP:  Owens Loffler, MD   No chief complaint on file.   HPI:  Corey Williams is a very pleasant 72 year old gentleman with history of  hyperlipidemia  moderate aortic valve insufficiency,  unable to exclude bicuspid aortic valve,  mildly dilated Ascending aorta on echocardiogram in 2015  who presents for follow-up of his aortic valve regurgitation.  he reports that he is doing well.  Denies any symptoms of shortness of breath or chest tightness with exertion.  Exercises daily walks 3 Williams a day Also does other activities works in his garden  denies any lightheadedness, dizziness. No fluid retention, no ankle swelling  no new symptoms   Previous echocardiogram reviewed with him in detail  EKG on today's visit shows no sinus rhythm with rate 63 bpm, T-wave abnormality inferior and lateral leads  echocardiogram reviewed with him in detail as below Echo 09/2015 Left ventricle: The cavity size was moderately dilated. Systolic function was normal. The estimated ejection fraction was in the range of 55% to 60%. Doppler parameters are consistent with abnormal left ventricular relaxation (grade 1 diastolic dysfunction). - Aortic valve: Poorly visualized. There was moderate regurgitation. - Aorta: The aortic root was mild to moderately dilated, 4.2 cm - Ascending aorta: The ascending aorta was mildly dilated, 3.9 cm    PMH:   has a past medical history of Heart murmur, Hereditary essential tremor (12/28/2013), and Hyperlipidemia.  PSH:    Past Surgical History:  Procedure Laterality Date  . no prior surgery      Current Outpatient Medications  Medication Sig Dispense Refill  . amoxicillin (AMOXIL) 875 MG tablet Take 1 tablet (875 mg total) by mouth 2 (two) times daily. 14 tablet 0  . aspirin 81 MG tablet Take 81 mg by mouth daily.      . Calcium Carbonate (CALCIUM 600 PO) Take 1 tablet  by mouth daily.    . fluticasone (FLONASE) 50 MCG/ACT nasal spray Place 2 sprays into both nostrils daily. 16 g 1  . GLUCOSAMINE CHONDROITIN COMPLX PO Take 2 tablets by mouth daily.    Marland Kitchen ibuprofen (ADVIL,MOTRIN) 200 MG tablet Take 200 mg by mouth every 6 (six) hours as needed.    . Multiple Vitamin (MULTIVITAMIN) tablet Take 1 tablet by mouth daily.      . pravastatin (PRAVACHOL) 40 MG tablet TAKE ONE TABLET BY MOUTH ONCE DAILY 90 tablet 2   No current facility-administered medications for this visit.      Allergies:   Patient has no known allergies.   Social History:  The patient  reports that he quit smoking about 43 years ago. He has never used smokeless tobacco. He reports that he drinks about 3.0 oz of alcohol per week. He reports that he does not use drugs.   Family History:   family history includes Stroke in his father.    Review of Systems: Review of Systems  Constitutional: Negative.   Respiratory: Negative.   Cardiovascular: Negative.   Gastrointestinal: Negative.   Musculoskeletal: Negative.   Neurological: Negative.   Psychiatric/Behavioral: Negative.   All other systems reviewed and are negative.    PHYSICAL EXAM: VS:  There were no vitals taken for this visit. , BMI There is no height or weight on file to calculate BMI. GEN: Well nourished, well developed, in no acute distress  HEENT: normal  Neck: no JVD, carotid bruits, or masses Cardiac:  RRR; 2/6 SEM murmurs, rubs, or gallops,no edema  Respiratory:  clear to auscultation bilaterally, normal work of breathing GI: soft, nontender, nondistended, + BS MS: no deformity or atrophy  Skin: warm and dry, no rash Neuro:  Strength and sensation are intact Psych: euthymic mood, full affect    Recent Labs: 01/04/2017: ALT 13; BUN 14; Creatinine, Ser 0.93; Hemoglobin 13.9; Platelets 211.0; Potassium 4.5; Sodium 141    Lipid Panel Lab Results  Component Value Date   CHOL 161 01/04/2017   HDL 41.50 01/04/2017    LDLCALC 103 (H) 01/04/2017   TRIG 82.0 01/04/2017      Wt Readings from Last 3 Encounters:  01/11/17 211 lb 4 oz (95.8 kg)  10/15/16 206 lb (93.4 kg)  09/04/16 207 lb 8 oz (94.1 kg)       ASSESSMENT AND PLAN:  Aortic valve regurgitation - Plan: EKG 12-Lead Stable moderate AI based on last year's echocardiogram,  denies any symptoms of shortness of breath No significant LV dilatation noted on echo, normal EF We have offered echocardiogram, he prefers to wait until next year as he is asymptomatic  HYPERCHOLESTEROLEMIA - Plan: EKG 12-Lead Cholesterol is at goal on the current lipid regimen. No changes to the medications were made.  Aneurysm of ascending aorta [facility]) Long discussion concerning his aorta aneurysm as on his last clinic visit    relatively stable since 2015 He prefers to repeat echocardiogram next year , order placed    Total encounter time more than 25 minutes  Greater than 50% was spent in counseling and coordination of care with the patient   Disposition:   F/U  12 months   No orders of the defined types were placed in this encounter.    Signed, Esmond Plants, M.D., Ph.D. 10/16/2017  [place], [place]

## 2017-10-18 ENCOUNTER — Ambulatory Visit: Payer: Medicare HMO | Admitting: Cardiovascular Disease

## 2017-10-19 ENCOUNTER — Ambulatory Visit (INDEPENDENT_AMBULATORY_CARE_PROVIDER_SITE_OTHER): Payer: Medicare HMO | Admitting: Cardiovascular Disease

## 2017-10-19 ENCOUNTER — Other Ambulatory Visit: Payer: Self-pay | Admitting: *Deleted

## 2017-10-19 ENCOUNTER — Encounter: Payer: Self-pay | Admitting: Cardiovascular Disease

## 2017-10-19 VITALS — BP 130/60 | HR 61 | Ht 72.0 in | Wt 210.2 lb

## 2017-10-19 DIAGNOSIS — I351 Nonrheumatic aortic (valve) insufficiency: Secondary | ICD-10-CM | POA: Diagnosis not present

## 2017-10-19 DIAGNOSIS — I7121 Aneurysm of the ascending aorta, without rupture: Secondary | ICD-10-CM

## 2017-10-19 DIAGNOSIS — I712 Thoracic aortic aneurysm, without rupture: Secondary | ICD-10-CM | POA: Diagnosis not present

## 2017-10-19 DIAGNOSIS — E7800 Pure hypercholesterolemia, unspecified: Secondary | ICD-10-CM | POA: Diagnosis not present

## 2017-10-19 NOTE — Patient Instructions (Addendum)
Medication Instructions:   No medication changes made  Labwork:  No new labs needed  Testing/Procedures:  We will order an echocardiogram in one year for aortic valve regurgitation   Follow-Up: It was a pleasure seeing you in the office today. Please call us if you have new issues that need to be addressed before your next appt.  [phone]  Your physician wants you to follow-up in: 12 months.  You will receive a reminder letter in the mail two months in advance. If you don't receive a letter, please call our office to schedule the follow-up appointment.  If you need a refill on your cardiac medications before your next appointment, please call your pharmacy.  For educational health videos Log in to : [email or web address] Or : [email or web address], password : [email or web address]

## 2017-10-19 NOTE — Progress Notes (Signed)
Cardiology Office Note  Date:  10/19/2017   ID:  Corey Williams, DOB 1945/11/09, MRN [id number]  PCP:  Owens Loffler, MD   Chief Complaint  Patient presents with  . other    12 month follow up. Meds reviewed by the pt. verbally. "doing well."     HPI:  Mr. Popp is a very pleasant 72 year old gentleman with history of  hyperlipidemia  moderate aortic valve insufficiency,  unable to exclude bicuspid aortic valve,  mildly dilated Ascending aorta on echocardiogram in 2015  who presents for follow-up of his aortic valve regurgitation.   echocardiogram reviewed with him in detail Echo 10/15/2017 Left ventricle: The cavity size was mildly dilated. Systolic function was normal. The estimated ejection fraction was in the range of 50% to 55%. Wall motion was normal; there were no  regional wall motion abnormalities. Doppler parameters are consistent with abnormal left ventricular relaxation (grade 1 diastolic dysfunction). - Aortic valve: There was moderate to severe regurgitation. - Aortic root: The aortic root was midlly dilated 3.6 cm - Ascending aorta: The ascending aorta was mildly dilated. 3.8 cm.   Aortic arch normal size - Mitral valve: There was mild regurgitation. - Left atrium: The atrium was moderately dilated. - Right ventricle: Systolic function was normal. - Pulmonary arteries: Systolic pressure was within the normal range.   Denies any symptoms of shortness of breath or chest tightness with exertion.  Exercises daily walks 3 miles a day works in his garden  denies any lightheadedness, dizziness. No fluid retention, no ankle swelling No symptoms  Long discussion concerning his diet and need to lose weight   EKG on today's visit shows no sinus rhythm with rate 61 bpm, nonspecific T-wave abnormality   Echo 09/2015 Left ventricle: The cavity size was moderately dilated. Systolic function was normal. The estimated ejection fraction was in the range of 55% to 60%. Doppler  parameters are consistent with abnormal left ventricular relaxation (grade 1 diastolic dysfunction). - Aortic valve: Poorly visualized. There was moderate regurgitation. - Aorta: The aortic root was mild to moderately dilated, 4.2 cm - Ascending aorta: The ascending aorta was mildly dilated, 3.9 cm   PMH:   has a past medical history of Heart murmur, Hereditary essential tremor (12/28/2013), and Hyperlipidemia.  PSH:    Past Surgical History:  Procedure Laterality Date  . no prior surgery      Current Outpatient Medications  Medication Sig Dispense Refill  . aspirin 81 MG tablet Take 81 mg by mouth daily.      . Calcium Carbonate (CALCIUM 600 PO) Take 1 tablet by mouth daily.    . fluticasone (FLONASE) 50 MCG/ACT nasal spray Place 2 sprays into both nostrils daily. 16 g 1  . ibuprofen (ADVIL,MOTRIN) 200 MG tablet Take 200 mg by mouth every 6 (six) hours as needed.    . Multiple Vitamin (MULTIVITAMIN) tablet Take 1 tablet by mouth daily.      . pravastatin (PRAVACHOL) 40 MG tablet TAKE ONE TABLET BY MOUTH ONCE DAILY 90 tablet 2   No current facility-administered medications for this visit.      Allergies:   Patient has no known allergies.   Social History:  The patient  reports that he quit smoking about 43 years ago. He has never used smokeless tobacco. He reports that he drinks about 3.0 oz of alcohol per week. He reports that he does not use drugs.   Family History:   family history includes Stroke in his father.  Review of Systems: Review of Systems  Constitutional: Negative.   Respiratory: Negative.   Cardiovascular: Negative.   Gastrointestinal: Negative.   Musculoskeletal: Negative.   Neurological: Negative.   Psychiatric/Behavioral: Negative.   All other systems reviewed and are negative.    PHYSICAL EXAM: VS:  BP 130/60 (BP Location: Left Arm, Patient Position: Sitting, Cuff Size: Normal)   Pulse 61   Ht 6' (1.829 m)   Wt 210 lb 4 oz (95.4 kg)   BMI 28.52  kg/m  , BMI Body mass index is 28.52 kg/m. Constitutional:  oriented to person, place, and time. No distress.  HENT:  Head: Normocephalic and atraumatic.  Eyes:  no discharge. No scleral icterus.  Neck: Normal range of motion. Neck supple. No JVD present.  Cardiovascular: Normal rate, regular rhythm, normal heart sounds and intact distal pulses. Exam reveals no gallop and no friction rub. No edema 2/6 diastolic murmur right sternal border radiating to the left Pulmonary/Chest: Effort normal and breath sounds normal. No stridor. No respiratory distress.  no wheezes.  no rales.  no tenderness.  Abdominal: Soft.  no distension.  no tenderness.  Musculoskeletal: Normal range of motion.  no  tenderness or deformity.  Neurological:  normal muscle tone. Coordination normal. No atrophy Skin: Skin is warm and dry. No rash noted. not diaphoretic.  Psychiatric:  normal mood and affect. behavior is normal. Thought content normal.      Recent Labs: 01/04/2017: ALT 13; BUN 14; Creatinine, Ser 0.93; Hemoglobin 13.9; Platelets 211.0; Potassium 4.5; Sodium 141    Lipid Panel Lab Results  Component Value Date   CHOL 161 01/04/2017   HDL 41.50 01/04/2017   LDLCALC 103 (H) 01/04/2017   TRIG 82.0 01/04/2017      Wt Readings from Last 3 Encounters:  10/19/17 210 lb 4 oz (95.4 kg)  01/11/17 211 lb 4 oz (95.8 kg)  10/15/16 206 lb (93.4 kg)       ASSESSMENT AND PLAN:  Aortic valve regurgitation - Plan: EKG 12-Lead Ejection fraction 50-55 asymptomatic moderate to severe in severity Discussed the guidelines with him in detail Currently does not meet class I or 2a indications for surgery He is not particularly interested at this time and surgery given he is asymptomatic Discussed the many other options in the future such as minimally invasive valve disease Recommended he call us for any new symptoms of shortness of breath chest tightness lower extremity edema PND orthopnea -echocardiogram in  one year  HYPERCHOLESTEROLEMIA - Plan: EKG 12-Lead Cholesterol is at goal on the current lipid regimen. No changes to the medications were made. stable  Aneurysm of ascending aorta (HCC) Dilation of aorta appears stable Numbers discussed with him   Total encounter time more than 25 minutes  Greater than 50% was spent in counseling and coordination of care with the patient   Disposition:   F/U  12 months   No orders of the defined types were placed in this encounter.    Signed, Esmond Plants, M.D., Ph.D. 10/19/2017  [place], [place]

## 2017-12-03 DIAGNOSIS — L03012 Cellulitis of left finger: Secondary | ICD-10-CM | POA: Diagnosis not present

## 2017-12-03 DIAGNOSIS — W57XXXA Bitten or stung by nonvenomous insect and other nonvenomous arthropods, initial encounter: Secondary | ICD-10-CM | POA: Diagnosis not present

## 2017-12-03 DIAGNOSIS — S60562A Insect bite (nonvenomous) of left hand, initial encounter: Secondary | ICD-10-CM | POA: Diagnosis not present

## 2017-12-15 DIAGNOSIS — E785 Hyperlipidemia, unspecified: Secondary | ICD-10-CM | POA: Diagnosis not present

## 2017-12-15 DIAGNOSIS — Z87891 Personal history of nicotine dependence: Secondary | ICD-10-CM | POA: Diagnosis not present

## 2017-12-15 DIAGNOSIS — Z823 Family history of stroke: Secondary | ICD-10-CM | POA: Diagnosis not present

## 2017-12-15 DIAGNOSIS — Z7982 Long term (current) use of aspirin: Secondary | ICD-10-CM | POA: Diagnosis not present

## 2018-01-03 ENCOUNTER — Other Ambulatory Visit: Payer: Self-pay | Admitting: Family Medicine

## 2018-01-03 DIAGNOSIS — Z79899 Other long term (current) drug therapy: Secondary | ICD-10-CM

## 2018-01-03 DIAGNOSIS — Z125 Encounter for screening for malignant neoplasm of prostate: Secondary | ICD-10-CM

## 2018-01-03 DIAGNOSIS — E785 Hyperlipidemia, unspecified: Secondary | ICD-10-CM

## 2018-01-05 ENCOUNTER — Other Ambulatory Visit (INDEPENDENT_AMBULATORY_CARE_PROVIDER_SITE_OTHER): Payer: Medicare HMO

## 2018-01-05 DIAGNOSIS — E785 Hyperlipidemia, unspecified: Secondary | ICD-10-CM | POA: Diagnosis not present

## 2018-01-05 DIAGNOSIS — Z79899 Other long term (current) drug therapy: Secondary | ICD-10-CM | POA: Diagnosis not present

## 2018-01-05 DIAGNOSIS — Z125 Encounter for screening for malignant neoplasm of prostate: Secondary | ICD-10-CM | POA: Diagnosis not present

## 2018-01-05 LAB — CBC WITH DIFFERENTIAL/PLATELET
BASOS PCT: 1.2 % (ref 0.0–3.0)
Basophils Absolute: 0.1 10*3/uL (ref 0.0–0.1)
Eosinophils Absolute: 0.4 10*3/uL (ref 0.0–0.7)
Eosinophils Relative: 8.2 % — ABNORMAL HIGH (ref 0.0–5.0)
HEMATOCRIT: 42.6 % (ref 39.0–52.0)
Hemoglobin: 14.3 g/dL (ref 13.0–17.0)
LYMPHS ABS: 1.3 10*3/uL (ref 0.7–4.0)
LYMPHS PCT: 25.8 % (ref 12.0–46.0)
MCHC: 33.5 g/dL (ref 30.0–36.0)
MCV: 96.5 fl (ref 78.0–100.0)
MONOS PCT: 11.1 % (ref 3.0–12.0)
Monocytes Absolute: 0.5 10*3/uL (ref 0.1–1.0)
NEUTROS ABS: 2.7 10*3/uL (ref 1.4–7.7)
Neutrophils Relative %: 53.7 % (ref 43.0–77.0)
PLATELETS: 232 10*3/uL (ref 150.0–400.0)
RBC: 4.41 Mil/uL (ref 4.22–5.81)
RDW: 13.1 % (ref 11.5–15.5)
WBC: 5 10*3/uL (ref 4.0–10.5)

## 2018-01-05 LAB — HEPATIC FUNCTION PANEL
ALK PHOS: 54 U/L (ref 39–117)
ALT: 15 U/L (ref 0–53)
AST: 17 U/L (ref 0–37)
Albumin: 4.5 g/dL (ref 3.5–5.2)
BILIRUBIN DIRECT: 0.2 mg/dL (ref 0.0–0.3)
BILIRUBIN TOTAL: 0.9 mg/dL (ref 0.2–1.2)
Total Protein: 7.5 g/dL (ref 6.0–8.3)

## 2018-01-05 LAB — LIPID PANEL
CHOL/HDL RATIO: 4
Cholesterol: 165 mg/dL (ref 0–200)
HDL: 41.6 mg/dL (ref >39.00)
LDL Cholesterol: 101 mg/dL — ABNORMAL HIGH (ref 0–99)
NONHDL: 123.16
Triglycerides: 111 mg/dL (ref 0.0–149.0)
VLDL: 22.2 mg/dL (ref 0.0–40.0)

## 2018-01-05 LAB — BASIC METABOLIC PANEL
BUN: 12 mg/dL (ref 6–23)
CO2: 31 meq/L (ref 19–32)
CREATININE: 0.92 mg/dL (ref 0.40–1.50)
Calcium: 9.6 mg/dL (ref 8.4–10.5)
Chloride: 103 mEq/L (ref 96–112)
GFR: 85.9 mL/min (ref >60.00)
Glucose, Bld: 89 mg/dL (ref 70–99)
Potassium: 4.4 mEq/L (ref 3.5–5.1)
SODIUM: 140 meq/L (ref 135–145)

## 2018-01-05 LAB — PSA: PSA: 1.04 ng/mL (ref 0.10–4.00)

## 2018-01-11 ENCOUNTER — Other Ambulatory Visit: Payer: Self-pay | Admitting: Family Medicine

## 2018-01-12 ENCOUNTER — Encounter: Payer: Medicare HMO | Admitting: Family Medicine

## 2018-01-17 ENCOUNTER — Telehealth: Payer: Self-pay | Admitting: Family Medicine

## 2018-01-17 NOTE — Telephone Encounter (Unsigned)
Copied from [place]. Topic: Quick Communication - Rx Refill/Question >> Jan 17, 2018  9:10 AM Carolyn Stare wrote: Medication  pravastatin (PRAVACHOL) 40 MG tablet      Showing RX was sent  on 01/11/18 but [facility] told pt they don't have it.  can we resend    Has the patient contacted their pharmacy yes    Preferred Pharmacy  [facility]   Agent: Please be advised that RX refills may take up to 3 business days. We ask that you follow-up with your pharmacy.

## 2018-01-17 NOTE — Telephone Encounter (Signed)
Pharmacy reports they did receive refill.

## 2018-02-22 NOTE — Progress Notes (Signed)
Dr. Frederico Hamman T. Ashwika Freels, MD, [facility] [place], [address] Phone: [phone] Fax: [phone]  02/23/2018  Patient: Corey Williams, MRN: [id number], DOB: 1946/02/22, 72 y.o.  Primary Physician:  Owens Loffler, MD   Chief Complaint  Patient presents with  . Medicare Wellness   Subjective:   Corey Williams is a 72 y.o. pleasant patient who presents for a medicare wellness examination:  Preventative Health Maintenance Visit:  Health Maintenance Summary Reviewed and updated, unless pt declines services.  Tobacco History Reviewed. Alcohol: No concerns, no excessive use Exercise Habits: Some activity, rec at least 30 mins 5 times a week STD concerns: no risk or activity to increase risk Drug Use: None Encouraged self-testicular check  Flu vaccine?  Health Maintenance  Topic Date Due  . INFLUENZA VACCINE  06/21/2018 (Originally 10/21/2017)  . TETANUS/TDAP  02/24/2019 (Originally 10/22/1964)  . COLONOSCOPY  02/14/2022  . Hepatitis C Screening  Completed  . PNA vac Low Risk Adult  Completed    Immunization History  Administered Date(s) Administered  . Influenza,inj,Quad PF,6+ Mos 01/17/2013, 12/27/2013, 01/07/2015, 01/08/2016  . Pneumococcal Conjugate-13 12/27/2013  . Pneumococcal Polysaccharide-23 12/22/2012    Patient Active Problem List   Diagnosis Date Noted  . Hereditary essential tremor 12/28/2013  . Aortic valve regurgitation 10/13/2013  . Aneurysm of ascending aorta ([place]) 10/13/2013  . HYPERCHOLESTEROLEMIA 06/05/2010  . ACTINIC KERATOSIS, HEAD 06/05/2010   Past Medical History:  Diagnosis Date  . Heart murmur   . Hereditary essential tremor 12/28/2013  . Hyperlipidemia    Past Surgical History:  Procedure Laterality Date  . no prior surgery     Social History   Socioeconomic History  . Marital status: Married    Spouse name: Not on file  . Number of children: Not on file  . Years of  education: Not on file  . Highest education level: Not on file  Occupational History  . Occupation: Scientist, clinical (histocompatibility and immunogenetics): RETIRED  Social Needs  . Financial resource strain: Not on file  . Food insecurity:    Worry: Not on file    Inability: Not on file  . Transportation needs:    Medical: Not on file    Non-medical: Not on file  Tobacco Use  . Smoking status: Former Smoker    Last attempt to quit: 02/01/1974    Years since quitting: 44.0  . Smokeless tobacco: Never Used  Substance and Sexual Activity  . Alcohol use: Yes    Alcohol/week: 5.0 standard drinks    Types: 5 Cans of beer per week    Comment: occ  . Drug use: No  . Sexual activity: Not on file  Lifestyle  . Physical activity:    Days per week: Not on file    Minutes per session: Not on file  . Stress: Not on file  Relationships  . Social connections:    Talks on phone: Not on file    Gets together: Not on file    Attends religious service: Not on file    Active member of club or organization: Not on file    Attends meetings of clubs or organizations: Not on file    Relationship status: Not on file  . Intimate partner violence:    Fear of current or ex partner: Not on file    Emotionally abused: Not on file    Physically abused: Not on file    Forced sexual activity:  Not on file  Other Topics Concern  . Not on file  Social History Narrative   Regular exercise--yes   Family History  Problem Relation Age of Onset  . Stroke Father   . Colon cancer Neg Hx   . Stomach cancer Neg Hx    No Known Allergies  Medication list has been reviewed and updated.   General: Denies fever, chills, sweats. No significant weight loss. Eyes: Denies blurring,significant itching ENT: Denies earache, sore throat, and hoarseness. Cardiovascular: Denies chest pains, palpitations, dyspnea on exertion Respiratory: Denies cough, dyspnea at rest,wheeezing Breast: no concerns about lumps GI: Denies nausea, vomiting, diarrhea,  constipation, change in bowel habits, abdominal pain, melena, hematochezia GU: Denies penile discharge, ED, urinary flow / outflow problems. No STD concerns. Musculoskeletal: Denies back pain, joint pain Derm: Denies rash, itching Neuro: Denies  paresthesias, frequent falls, frequent headaches Psych: Denies depression, anxiety Endocrine: Denies cold intolerance, heat intolerance, polydipsia Heme: Denies enlarged lymph nodes Allergy: No hayfever  Objective:   BP 118/60   Pulse 66   Temp (!) 97.5 F (36.4 C) (Oral)   Ht 6' (1.829 m)   Wt 210 lb (95.3 kg)   BMI 28.48 kg/m   The patient completed a fall screen and PHQ-2 and PHQ-9 if necessary, which is documented in the EHR. The CMA/LPN/RN who assisted the patient verbally completed with them and documented results in [place].   Hearing Screening   125Hz  250Hz  500Hz  1000Hz  2000Hz  3000Hz  4000Hz  6000Hz  8000Hz   Right ear:   20 20 25   0    Left ear:   25 20 25   0      Visual Acuity Screening   Right eye Left eye Both eyes  Without correction:     With correction: 20/25 20/20 20/20     GEN: well developed, well nourished, no acute distress Eyes: conjunctiva and lids normal, PERRLA, EOMI ENT: TM clear, nares clear, oral exam WNL Neck: supple, no lymphadenopathy, no thyromegaly, no JVD Pulm: clear to auscultation and percussion, respiratory effort normal CV: regular rate and rhythm, S1-S2, 3/6 SEM, rub or gallop, no bruits, peripheral pulses normal and symmetric, no cyanosis, clubbing, edema or varicosities GI: soft, non-tender; no hepatosplenomegaly, masses; active bowel sounds all quadrants GU: no hernia, testicular mass, penile discharge Lymph: no cervical, axillary or inguinal adenopathy MSK: gait normal, muscle tone and strength WNL, no joint swelling, effusions, discoloration, crepitus  SKIN: clear, good turgor, color WNL, no rashes, lesions, or ulcerations Neuro: normal mental status, normal strength, sensation,  and motion Psych: alert; oriented to person, place and time, normally interactive and not anxious or depressed in appearance.  All labs reviewed with patient.  Lipids:    Component Value Date/Time   CHOL 165 01/05/2018 0731   TRIG 111.0 01/05/2018 0731   HDL 41.60 01/05/2018 0731   LDLDIRECT 139.1 10/23/2010 0929   VLDL 22.2 01/05/2018 0731   CHOLHDL 4 01/05/2018 0731   CBC: CBC Latest Ref Rng & Units 01/05/2018 01/04/2017 01/01/2016  WBC 4.0 - 10.5 K/uL 5.0 5.0 5.9  Hemoglobin 13.0 - 17.0 g/dL 14.3 13.9 14.0  Hematocrit 39.0 - 52.0 % 42.6 40.7 42.0  Platelets 150.0 - 400.0 K/uL 232.0 211.0 177.9    Basic Metabolic Panel:    Component Value Date/Time   NA 140 01/05/2018 0731   K 4.4 01/05/2018 0731   CL 103 01/05/2018 0731   CO2 31 01/05/2018 0731   BUN 12 01/05/2018 0731   CREATININE 0.92 01/05/2018 0731  GLUCOSE 89 01/05/2018 0731   CALCIUM 9.6 01/05/2018 0731   Hepatic Function Latest Ref Rng & Units 01/05/2018 01/04/2017 01/01/2016  Total Protein 6.0 - 8.3 g/dL 7.5 7.1 7.4  Albumin 3.5 - 5.2 g/dL 4.5 4.1 3.8  AST 0 - 37 U/L 17 15 20   ALT 0 - 53 U/L 15 13 19   Alk Phosphatase 39 - 117 U/L 54 58 63  Total Bilirubin 0.2 - 1.2 mg/dL 0.9 0.7 0.7  Bilirubin, Direct 0.0 - 0.3 mg/dL 0.2 0.1 0.1    Lab Results  Component Value Date   TSH 3.76 12/20/2013   Lab Results  Component Value Date   PSA 1.04 01/05/2018   PSA 1.20 01/04/2017   PSA 0.88 01/01/2016    Assessment and Plan:   Healthcare maintenance  Health Maintenance Exam: The patient's preventative maintenance and recommended screening tests for an annual wellness exam were reviewed in full today. Brought up to date unless services declined.  Counselled on the importance of diet, exercise, and its role in overall health and mortality. The patient's FH and SH was reviewed, including their home life, tobacco status, and drug and alcohol status.  Follow-up in 1 year for physical exam or additional  follow-up below.  I have personally reviewed the Medicare Annual Wellness questionnaire and have noted 1. The patient's medical and social history 2. Their use of alcohol, tobacco or illicit drugs 3. Their current medications and supplements 4. The patient's functional ability including ADL's, fall risks, home safety risks and hearing or visual             impairment. 5. Diet and physical activities 6. Evidence for depression or mood disorders 7. Reviewed Updated provider list, see scanned forms and [facility] Snapshot.   The patients weight, height, BMI and visual acuity have been recorded in the chart I have made referrals, counseling and provided education to the patient based review of the above and I have provided the pt with a written personalized care plan for preventive services.  I have provided the patient with a copy of your personalized plan for preventive services. Instructed to take the time to review along with their updated medication list.  Follow-up: No follow-ups on file. Or follow-up in 1 year if not noted.  Signed,  Maud Deed. Joshaua Epple, MD   Allergies as of 02/23/2018   No Known Allergies     Medication List        Accurate as of 02/23/18  8:19 AM. Always use your most recent med list.          aspirin 81 MG tablet Take 81 mg by mouth daily.   CALCIUM 600 PO Take 1 tablet by mouth daily.   fluticasone 50 MCG/ACT nasal spray Commonly known as:  FLONASE Place 2 sprays into both nostrils daily.   ibuprofen 200 MG tablet Commonly known as:  ADVIL,MOTRIN Take 200 mg by mouth every 6 (six) hours as needed.   multivitamin tablet Take 1 tablet by mouth daily.   pravastatin 40 MG tablet Commonly known as:  PRAVACHOL TAKE 1 TABLET BY MOUTH ONCE DAILY

## 2018-02-23 ENCOUNTER — Encounter: Payer: Self-pay | Admitting: Family Medicine

## 2018-02-23 ENCOUNTER — Ambulatory Visit (INDEPENDENT_AMBULATORY_CARE_PROVIDER_SITE_OTHER): Payer: Medicare HMO | Admitting: Family Medicine

## 2018-02-23 VITALS — BP 118/60 | HR 66 | Temp 97.5°F | Ht 72.0 in | Wt 210.0 lb

## 2018-02-23 DIAGNOSIS — Z0000 Encounter for general adult medical examination without abnormal findings: Secondary | ICD-10-CM

## 2018-05-11 DIAGNOSIS — R69 Illness, unspecified: Secondary | ICD-10-CM | POA: Diagnosis not present

## 2018-09-29 ENCOUNTER — Telehealth: Payer: Self-pay

## 2018-09-29 NOTE — Telephone Encounter (Signed)
° ° °  COVID-19 Pre-Screening Questions:   In the past 7 to 10 days have you had a cough,  shortness of breath, headache, congestion, fever (100 or greater) body aches, chills, sore throat, or sudden loss of taste or sense of smell? no  Have you been around anyone with known Covid 19. no  Have you been around anyone who is awaiting Covid 19 test results in the past 7 to 10 days? yes  Have you been around anyone who has been exposed to Covid 19, or has mentioned symptoms of Covid 19 within the past 7 to 10 days? yes  If you have any concerns/questions about symptoms patients report during screening (either on the phone or at [facility]). Contact the provider seeing the patient or DOD for further guidance.  If neither are available contact a member of the leadership team.       Will reschedule

## 2018-09-30 ENCOUNTER — Other Ambulatory Visit: Payer: Medicare HMO

## 2018-10-11 ENCOUNTER — Encounter: Payer: Self-pay | Admitting: Physician Assistant

## 2018-10-11 ENCOUNTER — Ambulatory Visit: Payer: Medicare HMO | Admitting: Physician Assistant

## 2018-10-11 NOTE — Progress Notes (Signed)
Cardiology Office Note Date:  10/17/2018  Patient ID:  Corey Williams [birth year], MRN [id number] PCP:  Owens Loffler, MD  Cardiologist:  Dr. Rockey Situ, MD    Chief Complaint: Follow-up  History of Present Illness: Corey Williams is a 73 y.o. male with history of moderate to severe aortic valve insufficiency, dilated aortic root/ascending aorta, mitral regurgitation, HTN, and HLD who presents for follow up.   Prior echo from 09/2015 showed an EF of 55-60%, moderately dilated LV, Gr1DD, moderate aortic insufficiency, moderately dilated aortic root measuring 4.2 cm, mildly dilated ascending aorta measuring 3.9 cm. Most recent echo from 09/2017 showed an EF of 50-55%, mildly dilated LV, no RWMA, Gr1DD, moderate to severe aortic regurgitation with documented trileaflet aortic valve, mildly dilated aortic root measuring 3.6 cm, mildly dilated ascending aorta measuring 3.8 cm, aortic arch normal in size, mild mitral regurgitation, moderately dilated left atrium, RVSF normal, PASP normal.  He was most recently seen in the office in 09/2017 and was doing well without any symptoms of shortness of breath or chest tightness. Most recent echo from 10/13/2018 showed EF 50-55%, normal size cavity of the LV, DD, normal RVSF, mildly dilated LA, tricuspid aortic valve with moderate to severe AI. Abnormal size and structure aorta with mild dilatation of the ascending aorta measuring 39 mm, mildly dilated pulmonary artery.   Patient comes in doing well today.  He denies any chest pain, shortness of breath, palpitations, dizziness, presyncope, syncope.  No lower extremity swelling, abdominal distention, orthopnea, PND, early satiety.  No falls.  He continues to walk 3 miles per daily as long as does not raining and uses a push mower to mow his lawn without symptoms.  He does not check his blood pressure at home.  He does not have any issues or concerns today.  Labs: 12/2017 - LDL 101, HGB 14.3, AST/ALT normal, albumin  4.5, K+ 4.4, SCr 0.92   Past Medical History:  Diagnosis Date   Aortic insufficiency    Ascending aorta dilatation (HCC)    Dilated aortic root (HCC)    Hereditary essential tremor 12/28/2013   Hyperlipidemia     Past Surgical History:  Procedure Laterality Date   no prior surgery      Current Meds  Medication Sig   aspirin 81 MG tablet Take 81 mg by mouth daily.     Calcium Carbonate (CALCIUM 600 PO) Take 1 tablet by mouth daily.   fluticasone (FLONASE) 50 MCG/ACT nasal spray Place 2 sprays into both nostrils daily.   ibuprofen (ADVIL,MOTRIN) 200 MG tablet Take 200 mg by mouth every 6 (six) hours as needed.   Multiple Vitamin (MULTIVITAMIN) tablet Take 1 tablet by mouth daily.     pravastatin (PRAVACHOL) 40 MG tablet TAKE 1 TABLET BY MOUTH ONCE DAILY    Allergies:   Patient has no known allergies.   Social History:  The patient  reports that he quit smoking about 44 years ago. He has never used smokeless tobacco. He reports current alcohol use of about 5.0 standard drinks of alcohol per week. He reports that he does not use drugs.   Family History:  The patient's family history includes Stroke in his father.  ROS:   Review of Systems  Constitutional: Negative for chills, diaphoresis, fever, malaise/fatigue and weight loss.  HENT: Negative for congestion.   Eyes: Negative for discharge and redness.  Respiratory: Negative for cough, hemoptysis, sputum production, shortness of breath and wheezing.   Cardiovascular: Negative for chest pain,  palpitations, orthopnea, claudication, leg swelling and PND.  Gastrointestinal: Negative for abdominal pain, blood in stool, heartburn, melena, nausea and vomiting.  Genitourinary: Negative for hematuria.  Musculoskeletal: Negative for falls and myalgias.  Skin: Negative for rash.  Neurological: Negative for dizziness, tingling, tremors, sensory change, speech change, focal weakness, loss of consciousness and weakness.    Endo/Heme/Allergies: Does not bruise/bleed easily.  Psychiatric/Behavioral: Negative for substance abuse. The patient is not nervous/anxious.   All other systems reviewed and are negative.    PHYSICAL EXAM:  VS:  BP 140/70 (BP Location: Left Arm, Patient Position: Sitting, Cuff Size: Normal)    Pulse 69    Temp (!) 97.4 F (36.3 C)    Ht 6' (1.829 m)    Wt 212 lb 12 oz (96.5 kg)    SpO2 95%    BMI 28.85 kg/m  BMI: Body mass index is 28.85 kg/m.  Physical Exam  Constitutional: He is oriented to person, place, and time. He appears well-developed and well-nourished.  HENT:  Head: Normocephalic and atraumatic.  Eyes: Right eye exhibits no discharge. Left eye exhibits no discharge.  Neck: Normal range of motion. No JVD present.  Cardiovascular: Normal rate, regular rhythm, S1 normal and S2 normal. Exam reveals no distant heart sounds, no friction rub, no midsystolic click and no opening snap.  Murmur heard. High-pitched blowing decrescendo early diastolic murmur is present with a grade of 3/6 at the upper right sternal border radiating to the apex. Pulses:      Posterior tibial pulses are 2+ on the right side and 2+ on the left side.  Pulmonary/Chest: Effort normal and breath sounds normal. No respiratory distress. He has no decreased breath sounds. He has no wheezes. He has no rales. He exhibits no tenderness.  Abdominal: Soft. He exhibits no distension. There is no abdominal tenderness.  Musculoskeletal:        General: No edema.  Neurological: He is alert and oriented to person, place, and time.  Skin: Skin is warm and dry. No cyanosis. Nails show no clubbing.  Psychiatric: He has a normal mood and affect. His speech is normal and behavior is normal. Judgment and thought content normal.     EKG:  Was ordered and interpreted by me today. Shows NSR, 69 bpm, 1st degree AV block, inferior TWI (unchanged from prior)  Recent Labs: 01/05/2018: ALT 15; BUN 12; Creatinine, Ser 0.92;  Hemoglobin 14.3; Platelets 232.0; Potassium 4.4; Sodium 140  01/05/2018: Cholesterol 165; HDL 41.60; LDL Cholesterol 101; Total CHOL/HDL Ratio 4; Triglycerides 111.0; VLDL 22.2   CrCl cannot be calculated (Patient's most recent lab result is older than the maximum 21 days allowed.).   Wt Readings from Last 3 Encounters:  10/17/18 212 lb 12 oz (96.5 kg)  02/23/18 210 lb (95.3 kg)  10/19/17 210 lb 4 oz (95.4 kg)     Other studies reviewed: Additional studies/records reviewed today include: summarized above  ASSESSMENT AND PLAN:  1. Moderate to severe aortic valve insufficiency: Asymptomatic.  Patient is able to walk 3 miles per day as well as push mow his lawn and do all of the trimming.  Most recent echo showed stable moderate to severe aortic valve insufficiency with LV systolic function demonstrating an EF greater than 50% and nondilated left ventricular cavity.  Add losartan 12.5 mg daily for pharmacologic therapy.  Continue to monitor with echo every 12 months.  As long as his CTA aorta is stable he currently does not meet surgical recommendations and will be  continue to be monitored closely.  2. Dilated ascending aorta: Schedule CTA aorta.  Optimal blood pressure control recommended.  3. Hypertension: Blood pressure is mildly elevated today.  Start losartan 12.5 mg daily as above given aortic valve insufficiency and dilated ascending aorta.  Check BMP today and again in 1 week.  4. Hyperlipidemia: Followed by PCP.  LDL of 101 from 12/2017.  Remains on pravastatin.  Disposition: F/u with Dr. Rockey Situ or an APP in 12 months, sooner if needed.  Current medicines are reviewed at length with the patient today.  The patient did not have any concerns regarding medicines.  Signed, Christell Faith, PA-C 10/17/2018 8:29 AM     [facility] - [address] [place], [place] [address] [phone]

## 2018-10-13 ENCOUNTER — Ambulatory Visit (INDEPENDENT_AMBULATORY_CARE_PROVIDER_SITE_OTHER): Payer: Medicare HMO

## 2018-10-13 ENCOUNTER — Other Ambulatory Visit: Payer: Self-pay

## 2018-10-13 DIAGNOSIS — I351 Nonrheumatic aortic (valve) insufficiency: Secondary | ICD-10-CM | POA: Diagnosis not present

## 2018-10-14 ENCOUNTER — Telehealth: Payer: Self-pay | Admitting: Cardiovascular Disease

## 2018-10-14 NOTE — Telephone Encounter (Signed)

## 2018-10-17 ENCOUNTER — Ambulatory Visit (INDEPENDENT_AMBULATORY_CARE_PROVIDER_SITE_OTHER): Payer: Medicare HMO | Admitting: Physician Assistant

## 2018-10-17 ENCOUNTER — Encounter: Payer: Self-pay | Admitting: Physician Assistant

## 2018-10-17 ENCOUNTER — Other Ambulatory Visit: Payer: Self-pay

## 2018-10-17 VITALS — BP 140/70 | HR 69 | Temp 97.4°F | Ht 72.0 in | Wt 212.8 lb

## 2018-10-17 DIAGNOSIS — I10 Essential (primary) hypertension: Secondary | ICD-10-CM

## 2018-10-17 DIAGNOSIS — I351 Nonrheumatic aortic (valve) insufficiency: Secondary | ICD-10-CM | POA: Diagnosis not present

## 2018-10-17 DIAGNOSIS — I77810 Thoracic aortic ectasia: Secondary | ICD-10-CM | POA: Diagnosis not present

## 2018-10-17 DIAGNOSIS — E782 Mixed hyperlipidemia: Secondary | ICD-10-CM | POA: Diagnosis not present

## 2018-10-17 MED ORDER — LOSARTAN POTASSIUM 25 MG PO TABS: 12.5000 mg | ORAL_TABLET | Freq: Every day | ORAL | 3 refills | Status: DC

## 2018-10-17 NOTE — Patient Instructions (Signed)
Medication Instructions:  Your physician has recommended you make the following change in your medication:  1- START Losartan Take 0.5 tablets (12.5 mg total) by mouth daily If you need a refill on your cardiac medications before your next appointment, please call your pharmacy.   Lab work: 1- Your physician recommends that you have lab work today(BMET)  Your physician recommends that you return for lab work in: 1 weeks at [facility]. No appt is needed. Hours are M-F 7AM- 6 PM. (BMET)  If you have labs (blood work) drawn today and your tests are completely normal, you will receive your results only by: Marland Kitchen MyChart Message (if you have MyChart) OR . A paper copy in the mail If you have any lab test that is abnormal or we need to change your treatment, we will call you to review the results.  Testing/Procedures: 1- A CT Aorta has been ordered by your provider, Christell Faith, PA.  Please call imaging to schedule at [phone].  CT Angiography (CTA), is a special type of CT scan that uses a computer to produce multi-dimensional views of major blood vessels throughout the body. In CT angiography, a contrast material is injected through an IV to help visualize the blood vessels  Please arrive at the [facility] at 30-45 minutes prior to test start time.   Please follow these instructions carefully (unless otherwise directed):   On the Night Before the Test: . Be sure to Drink plenty of water. . Do not consume any caffeinated/decaffeinated beverages or chocolate 12 hours prior to your test. . Do not take any antihistamines 12 hours prior to your test.  On the Day of the Test: . Drink plenty of water. Do not drink any water within one hour of the test. . Do not eat any food 4 hours prior to the test. . You may take your regular medications prior to the test.       After the Test: . Drink plenty of water. . After receiving IV contrast, you may experience a mild  flushed feeling. This is normal. . On occasion, you may experience a mild rash up to 24 hours after the test. This is not dangerous. If this occurs, you can take Benadryl 25 mg and increase your fluid intake. . If you experience trouble breathing, this can be serious. If it is severe call 911 IMMEDIATELY. If it is mild, please call our office. . If you take any of these medications: Glipizide/Metformin, Avandament, Glucavance, please do not take 48 hours after completing test.   Follow-Up: At [facility], you and your health needs are our priority.  As part of our continuing mission to provide you with exceptional heart care, we have created designated Provider Care Teams.  These Care Teams include your primary Cardiologist (physician) and Advanced Practice Providers (APPs -  Physician Assistants and Nurse Practitioners) who all work together to provide you with the care you need, when you need it. You will need a follow up appointment in 12 months.  Please call our office 2 months in advance to schedule this appointment.  Please see Dr. Rockey Situ.

## 2018-10-18 LAB — BASIC METABOLIC PANEL
BUN/Creatinine Ratio: 15 (ref 10–24)
BUN: 14 mg/dL (ref 8–27)
CO2: 23 mmol/L (ref 20–29)
Calcium: 9.4 mg/dL (ref 8.6–10.2)
Chloride: 103 mmol/L (ref 96–106)
Creatinine, Ser: 0.94 mg/dL (ref 0.76–1.27)
GFR calc Af Amer: 93 mL/min/{1.73_m2} (ref >59)
GFR calc non Af Amer: 81 mL/min/{1.73_m2} (ref >59)
Glucose: 108 mg/dL — ABNORMAL HIGH (ref 65–99)
Potassium: 4.5 mmol/L (ref 3.5–5.2)
Sodium: 142 mmol/L (ref 134–144)

## 2018-10-19 ENCOUNTER — Other Ambulatory Visit: Payer: Medicare HMO

## 2018-10-21 ENCOUNTER — Ambulatory Visit
Admission: RE | Admit: 2018-10-21 | Discharge: 2018-10-21 | Disposition: A | Discharge status: Home / Self Care | Payer: Medicare HMO | Source: Ambulatory Visit | Attending: Physician Assistant | Admitting: Physician Assistant

## 2018-10-21 ENCOUNTER — Other Ambulatory Visit: Payer: Self-pay

## 2018-10-21 DIAGNOSIS — I2510 Atherosclerotic heart disease of native coronary artery without angina pectoris: Secondary | ICD-10-CM | POA: Diagnosis not present

## 2018-10-21 DIAGNOSIS — I77810 Thoracic aortic ectasia: Secondary | ICD-10-CM | POA: Diagnosis not present

## 2018-10-21 HISTORY — DX: Essential (primary) hypertension: I10

## 2018-10-21 MED ORDER — IOHEXOL 350 MG/ML SOLN
75.0000 mL | Freq: Once | INTRAVENOUS | Status: AC | PRN
  Administered 2018-10-21: 75 mL via INTRAVENOUS

## 2018-10-25 ENCOUNTER — Ambulatory Visit: Payer: Medicare HMO | Admitting: Physician Assistant

## 2018-10-25 ENCOUNTER — Telehealth: Payer: Self-pay | Admitting: *Deleted

## 2018-10-25 ENCOUNTER — Other Ambulatory Visit
Admission: RE | Admit: 2018-10-25 | Discharge: 2018-10-25 | Disposition: A | Discharge status: Home / Self Care | Payer: Medicare HMO | Source: Ambulatory Visit | Attending: Physician Assistant | Admitting: Physician Assistant

## 2018-10-25 DIAGNOSIS — I351 Nonrheumatic aortic (valve) insufficiency: Secondary | ICD-10-CM

## 2018-10-25 DIAGNOSIS — I77810 Thoracic aortic ectasia: Secondary | ICD-10-CM

## 2018-10-25 LAB — BASIC METABOLIC PANEL
Anion gap: 7 (ref 5–15)
BUN: 17 mg/dL (ref 8–23)
CO2: 25 mmol/L (ref 22–32)
Calcium: 9.6 mg/dL (ref 8.9–10.3)
Chloride: 107 mmol/L (ref 98–111)
Creatinine, Ser: 0.87 mg/dL (ref 0.61–1.24)
GFR calc Af Amer: >60 mL/min (ref >60)
GFR calc non Af Amer: >60 mL/min (ref >60)
Glucose, Bld: 96 mg/dL (ref 70–99)
Potassium: 4.3 mmol/L (ref 3.5–5.1)
Sodium: 139 mmol/L (ref 135–145)

## 2018-10-25 NOTE — Telephone Encounter (Signed)
-----   Message from Rise Mu, PA-C sent at 10/21/2018  5:58 PM EDT ----- Case was reviewed with the patient's primary cardiologist, Dr. Rockey Situ.  Reviewed CTA aorta with patient in detail.  Given dilated aortic root measuring 4.6 cm in the setting of moderate to severe aortic regurgitation with likely trileaflet aortic valve, though unable to exclude bicuspid on some studies, and in the setting of low normal LV systolic function with an EF of 50 to 55% as well as a mildly dilated left ventricular cavity by diastolic measurements, primary cardiologist and myself recommend referral to CVTS to establish care and weigh in on when surgical intervention would be indicated.  These recommendations were discussed in detail with the patient who agrees to the referral.  If CVTS would like to proceed with surgery we will see the patient back for diagnostic cardiac catheterization accordingly.  If however surgery is not indicated at this time diagnostic cardiac cath would be deferred.  Patient will continue recently started losartan and follow-up BMP on 10/24/2018 as previously planned.  Incidentally, the patient was noted to have probable hepatic and right renal cysts which were felt to be benign by radiology.  He was also noted to have coronary artery calcifications on imaging with a most recent LDL of 101.  In follow-up we will plan to update a fasting lipid panel with likely recommendation to transition to high intensity statin.  I will also forward this to the patient's PCP just to keep them in the loop.  [place] office clinical staff, please place referral to CVTS for moderate to severe aortic regurgitation in the setting of dilated aortic root and ascending aorta.  Patient is aware referral will be placed.

## 2018-10-25 NOTE — Telephone Encounter (Signed)
Per Ryan's result note, patient is aware of results.  Referral to cardiothoracic surgery placed.

## 2018-10-27 ENCOUNTER — Encounter: Payer: Self-pay | Admitting: Thoracic Surgery (Cardiothoracic Vascular Surgery)

## 2018-10-27 ENCOUNTER — Other Ambulatory Visit: Payer: Self-pay

## 2018-10-27 ENCOUNTER — Institutional Professional Consult (permissible substitution): Payer: Medicare HMO | Admitting: Thoracic Surgery (Cardiothoracic Vascular Surgery)

## 2018-10-27 VITALS — BP 168/75 | HR 63 | Temp 93.9°F | Resp 20 | Ht 72.0 in | Wt 212.0 lb

## 2018-10-27 DIAGNOSIS — I351 Nonrheumatic aortic (valve) insufficiency: Secondary | ICD-10-CM

## 2018-10-27 DIAGNOSIS — I7121 Aneurysm of the ascending aorta, without rupture: Secondary | ICD-10-CM

## 2018-10-27 DIAGNOSIS — I712 Thoracic aortic aneurysm, without rupture: Secondary | ICD-10-CM

## 2018-10-27 NOTE — Progress Notes (Signed)
PCP is Owens Loffler, MD Referring Provider is Owens Loffler, MD  Chief Complaint  Patient presents with  . Thoracic Aortic Aneurysm    Surgical eval, CTA Chest 10/21/18, ECHO 10/13/18  . Aortic Insuffiency    HPI: Corey Williams is sent for consultation regarding aortic insufficiency and aortic root aneurysm.  Corey Williams is a 73 year old gentleman with a past medical history significant for aortic insufficiency, dilated aortic root, hypertension, hyperlipidemia, and hereditary essential tremor.  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  He was again noted to have a heart murmur on the physical exam about 5 years ago.  An echocardiogram showed moderate aortic insufficiency.  He has been followed since that time.  He recently had a repeat echocardiogram which showed moderate to severe aortic insufficiency, low normal systolic function with ejection fraction of 50 to 55%.  Mild left ventricular hypertrophy.  Mild diastolic dysfunction.  LV end-diastolic diameter was 5.8 cm and end-systolic diameter was 4.2 cm.  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  He remains active.  He walks about 3 miles per day.  He does get tired at the end of his walk but that has not changed recently.  He is not having any orthopnea or paroxysmal nocturnal dyspnea or peripheral edema.   Past Medical History:  Diagnosis Date  . Aortic insufficiency   . Ascending aorta dilatation (HCC)   . Dilated aortic root ([facility])   . Hereditary essential tremor 12/28/2013  . Hyperlipidemia   . Hypertension     Past Surgical History:  Procedure Laterality Date  . no prior surgery      Family History  Problem Relation Age of Onset  . Stroke Father   . Colon cancer Neg Hx   . Stomach cancer Neg Hx     Social History Social History   Tobacco Use  . Smoking status: Former Smoker    Quit date: 02/01/1974    Years since quitting: 44.7  . Smokeless tobacco: Never Used   Substance Use Topics  . Alcohol use: Yes    Alcohol/week: 5.0 standard drinks    Types: 5 Cans of beer per week    Comment: occ  . Drug use: No    Current Outpatient Medications  Medication Sig Dispense Refill  . aspirin 81 MG tablet Take 81 mg by mouth daily.      . Calcium Carbonate (CALCIUM 600 PO) Take 1 tablet by mouth daily.    . fluticasone (FLONASE) 50 MCG/ACT nasal spray Place 2 sprays into both nostrils daily. 16 g 1  . ibuprofen (ADVIL,MOTRIN) 200 MG tablet Take 200 mg by mouth every 6 (six) hours as needed.    Marland Kitchen losartan (COZAAR) 25 MG tablet Take 0.5 tablets (12.5 mg total) by mouth daily. 45 tablet 3  . Multiple Vitamin (MULTIVITAMIN) tablet Take 1 tablet by mouth daily.      . pravastatin (PRAVACHOL) 40 MG tablet TAKE 1 TABLET BY MOUTH ONCE DAILY 90 tablet 3   No current facility-administered medications for this visit.     No Known Allergies  Review of Systems  Constitutional: Negative for activity change and unexpected weight change.  HENT: Negative for trouble swallowing and voice change.   Respiratory: Negative for shortness of breath and wheezing.   Cardiovascular: Negative for chest pain and leg swelling.  Gastrointestinal: Negative for abdominal distention and abdominal pain.  Genitourinary: Negative for dysuria.  Musculoskeletal: Negative for arthralgias  and myalgias.  Neurological: Negative for syncope and weakness.  Hematological: Does not bruise/bleed easily.  Psychiatric/Behavioral: The patient is nervous/anxious.   All other systems reviewed and are negative.   BP (!) 168/75   Pulse 63   Temp (!) 93.9 F (34.4 C) (Skin)   Resp 20   Ht 6' (1.829 m)   Wt 212 lb (96.2 kg)   SpO2 96% Comment: RA  BMI 28.75 kg/m  Physical Exam Vitals signs reviewed.  Constitutional:      General: He is not in acute distress.    Appearance: Normal appearance.  HENT:     Head: Normocephalic and atraumatic.     Comments: Wearing surgical mask Eyes:      General: No scleral icterus.    Extraocular Movements: Extraocular movements intact.     Conjunctiva/sclera: Conjunctivae normal.  Neck:     Musculoskeletal: Neck supple.     Vascular: No carotid bruit.  Cardiovascular:     Rate and Rhythm: Normal rate and regular rhythm.     Heart sounds: Murmur (2/6 [id number]/4/4 diastolic right upper sternal border) present.  Pulmonary:     Effort: Pulmonary effort is normal. No respiratory distress.     Breath sounds: Normal breath sounds. No wheezing or rales.  Abdominal:     General: There is no distension.     Palpations: Abdomen is soft.     Tenderness: There is no abdominal tenderness.  Musculoskeletal:        General: No swelling.  Lymphadenopathy:     Cervical: No cervical adenopathy.  Skin:    General: Skin is warm and dry.  Neurological:     General: No focal deficit present.     Mental Status: He is alert.     Cranial Nerves: No cranial nerve deficit.     Motor: No weakness.     Diagnostic Tests: CT ANGIOGRAPHY CHEST WITH CONTRAST  TECHNIQUE: Multidetector CT imaging of the chest was performed using the standard protocol during bolus administration of intravenous contrast. Multiplanar CT image reconstructions and MIPs were obtained to evaluate the vascular anatomy.  CONTRAST:  76mL OMNIPAQUE IOHEXOL 350 MG/ML SOLN  COMPARISON:  None.  FINDINGS: Cardiovascular: Variant arch anatomy. The brachiocephalic and left common carotid artery share a common origin. The vertebral artery arises directly from the aorta. No significant atherosclerotic plaque, dissection or irregular thrombus. There is mild dilation of the aortic root which measures a maximum of 4.6 cm at the sinuses of Valsalva. The tubular portion of the ascending thoracic aorta is mildly ectatic at 3.8 cm. No effacement of the sino-tubular junction. Faint calcifications visualized along the coronary arteries. The heart is normal in size. No evidence of  atrial thrombus. No pericardial effusion. The main pulmonary artery is normal in caliber.  Mediastinum/Nodes: Unremarkable CT appearance of the thyroid gland. No suspicious mediastinal or hilar adenopathy. No soft tissue mediastinal mass. The thoracic esophagus is unremarkable.  Lungs/Pleura: Small cluster of tree-in-bud micro nodularity in the dependent aspect of the right lower lobe. None of the individual nodules measure larger than 3 mm. Trace dependent atelectasis.  Upper Abdomen: No acute abnormality. Circumscribed low-attenuation lesion in hepatic segment 8 is too small for accurate characterization but statistically highly likely a benign cyst. Incompletely imaged low-attenuation lesion exophytic from the upper pole of the right kidney also likely represents a cyst.  Musculoskeletal: No acute fracture or aggressive appearing lytic or blastic osseous lesion.  Review of the MIP images confirms the above findings.  IMPRESSION:  1. Dilated aortic root measuring up to 4.6 cm at the sinuses of Valsalva. 2. Ectatic ascending thoracic aorta with a maximal diameter of 3.8 cm. 3. Coronary artery calcifications. 4. Variant arch vessel anatomy. 5. Probable hepatic and right renal cysts.  Signed,  Criselda Peaches, MD, [facility]  [facility]   Electronically Signed   By: Jacqulynn Cadet M.D.   On: 10/21/2018 16:20 IMPRESSIONS    1. The left ventricle has low normal systolic function, with an ejection fraction of 50-55%. The cavity size was normal. There is mildly increased left ventricular wall thickness. Left ventricular diastolic Doppler parameters are consistent with  impaired relaxation. No evidence of left ventricular regional wall motion abnormalities.  2. The right ventricle has normal systolic function. The cavity was normal. There is no increase in right ventricular wall thickness. Right  ventricular systolic pressure is normal with an estimated pressure of 26.7 mmHg.  3. Left atrial size was mildly dilated.  4. The mitral valve was not well visualized. Mild thickening of the mitral valve leaflet.  5. The tricuspid valve is grossly normal.  6. The aortic valve is tricuspid. Aortic valve regurgitation is moderate to severe by color flow Doppler.  7. The aorta is abnormal in size and structure.  8. There is mild dilatation of the ascending aorta measuring 39 mm.  9. Mildly dilated pulmonary artery. 10. The inferior vena cava was dilated in size with >50% respiratory variability. I personally reviewed the echocardiogram and CT images and concur with the findings noted above  Impression: Jonhatan Hearty is a 73 year old gentleman with known aortic insufficiency dating back to 2015.  His most recent echocardiogram showed moderate to severe aortic insufficiency.  He also has a small aortic root aneurysm.  Both are asymptomatic.  Aortic insufficiency-moderate to severe.  Left ventricular systolic and diastolic diameters are well within the acceptable range.  Systolic function is preserved.  There is some mild diastolic dysfunction.  No indication for surgery at the present time.  Would probably follow him with echocardiograms every 6 months or so.   Aortic root aneurysm-mildly dilated aortic root with maximum diameter 4.6 cm at the sinuses of Valsalva.  Mid ascending aorta is about 3.7 cm.  No indication for surgery.  We will repeat a CT angio in 6 months.  Hypertension-blood pressure elevated today.  He just was started on Cozaar about week ago.  He will follow-up with Dr. Rockey Situ regarding that.  Plan: Return in 6 months after echocardiogram and CT angiogram of chest  Melrose Nakayama, MD [facility] [phone]

## 2018-11-08 ENCOUNTER — Telehealth: Payer: Self-pay | Admitting: *Deleted

## 2018-11-08 DIAGNOSIS — I351 Nonrheumatic aortic (valve) insufficiency: Secondary | ICD-10-CM

## 2018-11-08 NOTE — Telephone Encounter (Signed)
Spoke with patient and reviewed Dr. Donivan Scull recommendations for repeat testing in 6 months and he reports that he has all of that wrote down to call us at the end of the year to get all of this set up again. He had no further questions at this time and was appreciative for the call with information.

## 2018-11-08 NOTE — Telephone Encounter (Signed)
-----   Message from Minna Merritts, MD sent at 11/06/2018  3:50 PM EDT ----- Can we set up echo for aortic valve disease in 6 months and  CTA chest in 6 months Thx TG ----- Message ----- From: Melrose Nakayama, MD Sent: 10/27/2018   5:10 PM EDT To: Minna Merritts, MD  Tim  I saw Corey Williams in the office today.  As you know he is a 73 year old man with a history of aortic insufficiency and a small root aneurysm.  I do not see any indications for surgery at this time.  He probably should have an echocardiogram and a CT angiogram in about 6 months.  I will plan to see him back after those.  He is very anxious about the possibility of having to have surgery.  Apparently his brother had an AVR that was quite complicated.  I told him that I will think it will be necessary anytime soon, but he likely ultimately will need an operation.  Thanks  Richardson Landry

## 2018-11-08 NOTE — Telephone Encounter (Signed)
Left voicemail message that I have some recommendations from provider for repeat testing in roughly 6 months.

## 2019-02-06 ENCOUNTER — Other Ambulatory Visit: Payer: Self-pay | Admitting: Family Medicine

## 2019-02-20 ENCOUNTER — Other Ambulatory Visit: Payer: Self-pay | Admitting: Family Medicine

## 2019-02-20 DIAGNOSIS — Z79899 Other long term (current) drug therapy: Secondary | ICD-10-CM

## 2019-02-20 DIAGNOSIS — Z131 Encounter for screening for diabetes mellitus: Secondary | ICD-10-CM

## 2019-02-20 DIAGNOSIS — E785 Hyperlipidemia, unspecified: Secondary | ICD-10-CM

## 2019-02-20 DIAGNOSIS — R69 Illness, unspecified: Secondary | ICD-10-CM | POA: Diagnosis not present

## 2019-02-20 DIAGNOSIS — Z125 Encounter for screening for malignant neoplasm of prostate: Secondary | ICD-10-CM

## 2019-02-21 ENCOUNTER — Other Ambulatory Visit (INDEPENDENT_AMBULATORY_CARE_PROVIDER_SITE_OTHER): Payer: Medicare HMO

## 2019-02-21 ENCOUNTER — Other Ambulatory Visit: Payer: Self-pay

## 2019-02-21 DIAGNOSIS — Z125 Encounter for screening for malignant neoplasm of prostate: Secondary | ICD-10-CM | POA: Diagnosis not present

## 2019-02-21 DIAGNOSIS — Z131 Encounter for screening for diabetes mellitus: Secondary | ICD-10-CM | POA: Diagnosis not present

## 2019-02-21 DIAGNOSIS — E785 Hyperlipidemia, unspecified: Secondary | ICD-10-CM

## 2019-02-21 DIAGNOSIS — Z79899 Other long term (current) drug therapy: Secondary | ICD-10-CM

## 2019-02-21 LAB — CBC WITH DIFFERENTIAL/PLATELET
Basophils Absolute: 0.1 10*3/uL (ref 0.0–0.1)
Basophils Relative: 1.4 % (ref 0.0–3.0)
Eosinophils Absolute: 0.3 10*3/uL (ref 0.0–0.7)
Eosinophils Relative: 6.6 % — ABNORMAL HIGH (ref 0.0–5.0)
HCT: 42.2 % (ref 39.0–52.0)
Hemoglobin: 14.4 g/dL (ref 13.0–17.0)
Lymphocytes Relative: 24.9 % (ref 12.0–46.0)
Lymphs Abs: 1.3 10*3/uL (ref 0.7–4.0)
MCHC: 34.2 g/dL (ref 30.0–36.0)
MCV: 96.8 fl (ref 78.0–100.0)
Monocytes Absolute: 0.5 10*3/uL (ref 0.1–1.0)
Monocytes Relative: 10.1 % (ref 3.0–12.0)
Neutro Abs: 2.9 10*3/uL (ref 1.4–7.7)
Neutrophils Relative %: 57 % (ref 43.0–77.0)
Platelets: 221 10*3/uL (ref 150.0–400.0)
RBC: 4.36 Mil/uL (ref 4.22–5.81)
RDW: 12.8 % (ref 11.5–15.5)
WBC: 5.2 10*3/uL (ref 4.0–10.5)

## 2019-02-21 LAB — BASIC METABOLIC PANEL
BUN: 12 mg/dL (ref 6–23)
CO2: 29 mEq/L (ref 19–32)
Calcium: 9.5 mg/dL (ref 8.4–10.5)
Chloride: 103 mEq/L (ref 96–112)
Creatinine, Ser: 0.9 mg/dL (ref 0.40–1.50)
GFR: 82.64 mL/min (ref >60.00)
Glucose, Bld: 95 mg/dL (ref 70–99)
Potassium: 4.1 mEq/L (ref 3.5–5.1)
Sodium: 138 mEq/L (ref 135–145)

## 2019-02-21 LAB — HEPATIC FUNCTION PANEL
ALT: 15 U/L (ref 0–53)
AST: 16 U/L (ref 0–37)
Albumin: 4.3 g/dL (ref 3.5–5.2)
Alkaline Phosphatase: 54 U/L (ref 39–117)
Bilirubin, Direct: 0.1 mg/dL (ref 0.0–0.3)
Total Bilirubin: 0.9 mg/dL (ref 0.2–1.2)
Total Protein: 7.2 g/dL (ref 6.0–8.3)

## 2019-02-21 LAB — LIPID PANEL
Cholesterol: 179 mg/dL (ref 0–200)
HDL: 44 mg/dL (ref >39.00)
LDL Cholesterol: 113 mg/dL — ABNORMAL HIGH (ref 0–99)
NonHDL: 135.02
Total CHOL/HDL Ratio: 4
Triglycerides: 108 mg/dL (ref 0.0–149.0)
VLDL: 21.6 mg/dL (ref 0.0–40.0)

## 2019-02-21 LAB — HEMOGLOBIN A1C: Hgb A1c MFr Bld: 5.7 % (ref 4.6–6.5)

## 2019-02-22 LAB — PSA, TOTAL WITH REFLEX TO PSA, FREE: PSA, Total: 0.9 ng/mL (ref <=4.0)

## 2019-02-27 ENCOUNTER — Ambulatory Visit (INDEPENDENT_AMBULATORY_CARE_PROVIDER_SITE_OTHER): Payer: Medicare HMO | Admitting: Family Medicine

## 2019-02-27 ENCOUNTER — Other Ambulatory Visit: Payer: Self-pay

## 2019-02-27 ENCOUNTER — Encounter: Payer: Self-pay | Admitting: Family Medicine

## 2019-02-27 VITALS — BP 140/62 | HR 62 | Temp 97.1°F | Ht 72.0 in | Wt 214.5 lb

## 2019-02-27 DIAGNOSIS — Z0000 Encounter for general adult medical examination without abnormal findings: Secondary | ICD-10-CM | POA: Diagnosis not present

## 2019-02-27 NOTE — Progress Notes (Signed)
Corey Becvar T. Hong Timm, MD Primary Care and [place] at [facility] [place], [address] Phone: [phone]  FAX: [phone]  Corey Williams - 73 y.o. male  MRN [id number]  Date of Birth: 05-13-1945  Visit Date: 02/27/2019  PCP: Owens Loffler, MD  Referred by: Owens Loffler, MD  Chief Complaint  Patient presents with  . Medicare Wellness    This visit occurred during the SARS-CoV-2 public health emergency.  Safety protocols were in place, including screening questions prior to the visit, additional usage of staff PPE, and extensive cleaning of exam room while observing appropriate contact time as indicated for disinfecting solutions.   Patient Care Team: Owens Loffler, MD as PCP - General (Family Medicine) Subjective:   Corey Williams is a 73 y.o. pleasant patient who presents for a medicare wellness examination:  Preventative Health Maintenance Visit:  Health Maintenance Summary Reviewed and updated, unless pt declines services.  Tobacco History Reviewed. Alcohol: No concerns, no excessive use Exercise Habits: Some activity, rec at least 30 mins 5 times a week STD concerns: no risk or activity to increase risk Drug Use: None  Sees the heart doctor onnce a year.     Health Maintenance  Topic Date Due  . TETANUS/TDAP  10/22/1964  . INFLUENZA VACCINE  06/21/2019 (Originally 10/22/2018)  . COLONOSCOPY  02/14/2022  . Hepatitis C Screening  Completed  . PNA vac Low Risk Adult  Completed    Immunization History  Administered Date(s) Administered  . Influenza,inj,Quad PF,6+ Mos 01/17/2013, 12/27/2013, 01/07/2015, 01/08/2016  . Pneumococcal Conjugate-13 12/27/2013  . Pneumococcal Polysaccharide-23 12/22/2012    Patient Active Problem List   Diagnosis Date Noted  . Hereditary essential tremor 12/28/2013  . Aortic valve regurgitation 10/13/2013  . Aneurysm of ascending aorta ([place]) 10/13/2013  .  HYPERCHOLESTEROLEMIA 06/05/2010  . ACTINIC KERATOSIS, HEAD 06/05/2010   Past Medical History:  Diagnosis Date  . Aortic insufficiency   . Ascending aorta dilatation (HCC)   . Dilated aortic root ([place])   . Hereditary essential tremor 12/28/2013  . Hyperlipidemia   . Hypertension    Past Surgical History:  Procedure Laterality Date  . no prior surgery     Social History   Socioeconomic History  . Marital status: Married    Spouse name: Not on file  . Number of children: Not on file  . Years of education: Not on file  . Highest education level: Not on file  Occupational History  . Occupation: Scientist, clinical (histocompatibility and immunogenetics): RETIRED  Social Needs  . Financial resource strain: Not on file  . Food insecurity    Worry: Not on file    Inability: Not on file  . Transportation needs    Medical: Not on file    Non-medical: Not on file  Tobacco Use  . Smoking status: Former Smoker    Quit date: 02/01/1974    Years since quitting: 45.1  . Smokeless tobacco: Never Used  Substance and Sexual Activity  . Alcohol use: Yes    Alcohol/week: 5.0 standard drinks    Types: 5 Cans of beer per week    Comment: occ  . Drug use: No  . Sexual activity: Not on file  Lifestyle  . Physical activity    Days per week: Not on file    Minutes per session: Not on file  . Stress: Not on file  Relationships  . Social Herbalist on phone:  Not on file    Gets together: Not on file    Attends religious service: Not on file    Active member of club or organization: Not on file    Attends meetings of clubs or organizations: Not on file    Relationship status: Not on file  . Intimate partner violence    Fear of current or ex partner: Not on file    Emotionally abused: Not on file    Physically abused: Not on file    Forced sexual activity: Not on file  Other Topics Concern  . Not on file  Social History Narrative   Regular exercise--yes   Family History  Problem Relation Age of Onset  .  Stroke Father   . Colon cancer Neg Hx   . Stomach cancer Neg Hx    No Known Allergies  Medication list has been reviewed and updated.   General: Denies fever, chills, sweats. No significant weight loss. Eyes: Denies blurring,significant itching ENT: Denies earache, sore throat, and hoarseness. Cardiovascular: Denies chest pains, palpitations, dyspnea on exertion Respiratory: Denies cough, dyspnea at rest,wheeezing Breast: no concerns about lumps GI: Denies nausea, vomiting, diarrhea, constipation, change in bowel habits, abdominal pain, melena, hematochezia GU: Denies penile discharge, ED, urinary flow / outflow problems. No STD concerns. Musculoskeletal: Denies back pain, joint pain Derm: Denies rash, itching Neuro: Denies  paresthesias, frequent falls, frequent headaches Psych: Denies depression, anxiety Endocrine: Denies cold intolerance, heat intolerance, polydipsia Heme: Denies enlarged lymph nodes Allergy: No hayfever  Objective:   BP 140/62   Pulse 62   Temp (!) 97.1 F (36.2 C) (Temporal)   Ht 6' (1.829 m)   Wt 214 lb 8 oz (97.3 kg)   SpO2 98%   BMI 29.09 kg/m  Fall Risk  02/27/2019 02/23/2018 01/11/2017 01/08/2016 01/07/2015  Falls in the past year? 0 0 No No No   Ideal Body Weight: Weight in (lb) to have BMI = 25: 183.9  Hearing Screening   Method: Audiometry   125Hz  250Hz  500Hz  1000Hz  2000Hz  3000Hz  4000Hz  6000Hz  8000Hz   Right ear:   25 20 20   0    Left ear:   20 20 20   0      Visual Acuity Screening   Right eye Left eye Both eyes  Without correction:     With correction: 20/20 20/20 20/20    Depression screen [facility] 2/9 02/27/2019 02/23/2018 01/11/2017 01/08/2016 01/07/2015  Decreased Interest 0 0 0 0 0  Down, Depressed, Hopeless 0 0 0 0 0  PHQ - 2 Score 0 0 0 0 0     GEN: well developed, well nourished, no acute distress Eyes: conjunctiva and lids normal, PERRLA, EOMI ENT: TM clear, nares clear, oral exam WNL Neck: supple, no lymphadenopathy, no  thyromegaly, no JVD Pulm: clear to auscultation and percussion, respiratory effort normal CV: regular rate and rhythm, [id number], 3/6 systolic ejection murmur, rub or gallop, no bruits, peripheral pulses normal and symmetric, no cyanosis, clubbing, edema or varicosities GI: soft, non-tender; no hepatosplenomegaly, masses; active bowel sounds all quadrants GU: no hernia, testicular mass, penile discharge Lymph: no cervical, axillary or inguinal adenopathy MSK: gait normal, muscle tone and strength WNL, no joint swelling, effusions, discoloration, crepitus  SKIN: clear, good turgor, color WNL, no rashes, lesions, or ulcerations Neuro: normal mental status, normal strength, sensation, and motion Psych: alert; oriented to person, place and time, normally interactive and not anxious or depressed in appearance.  All labs reviewed with patient. Results for orders placed  or performed in visit on 02/21/19  PSA, Total with Reflex to PSA, Free  Result Value Ref Range   PSA, Total 0.9 < OR = 4.0 ng/mL  Hemoglobin A1c  Result Value Ref Range   Hgb A1c MFr Bld 5.7 4.6 - 6.5 %  Basic metabolic panel  Result Value Ref Range   Sodium 138 135 - 145 mEq/L   Potassium 4.1 3.5 - 5.1 mEq/L   Chloride 103 96 - 112 mEq/L   CO2 29 19 - 32 mEq/L   Glucose, Bld 95 70 - 99 mg/dL   BUN 12 6 - 23 mg/dL   Creatinine, Ser 0.90 0.40 - 1.50 mg/dL   GFR 82.64 >60.00 mL/min   Calcium 9.5 8.4 - 10.5 mg/dL  Liver Profile  Result Value Ref Range   Total Bilirubin 0.9 0.2 - 1.2 mg/dL   Bilirubin, Direct 0.1 0.0 - 0.3 mg/dL   Alkaline Phosphatase 54 39 - 117 U/L   AST 16 0 - 37 U/L   ALT 15 0 - 53 U/L   Total Protein 7.2 6.0 - 8.3 g/dL   Albumin 4.3 3.5 - 5.2 g/dL  CBC with Differential/Platelet  Result Value Ref Range   WBC 5.2 4.0 - 10.5 K/uL   RBC 4.36 4.22 - 5.81 Mil/uL   Hemoglobin 14.4 13.0 - 17.0 g/dL   HCT 42.2 39.0 - 52.0 %   MCV 96.8 78.0 - 100.0 fl   MCHC 34.2 30.0 - 36.0 g/dL   RDW 12.8 11.5 - 15.5 %    Platelets 221.0 150.0 - 400.0 K/uL   Neutrophils Relative % 57.0 43.0 - 77.0 %   Lymphocytes Relative 24.9 12.0 - 46.0 %   Monocytes Relative 10.1 3.0 - 12.0 %   Eosinophils Relative 6.6 (H) 0.0 - 5.0 %   Basophils Relative 1.4 0.0 - 3.0 %   Neutro Abs 2.9 1.4 - 7.7 K/uL   Lymphs Abs 1.3 0.7 - 4.0 K/uL   Monocytes Absolute 0.5 0.1 - 1.0 K/uL   Eosinophils Absolute 0.3 0.0 - 0.7 K/uL   Basophils Absolute 0.1 0.0 - 0.1 K/uL  Lipid Profile  Result Value Ref Range   Cholesterol 179 0 - 200 mg/dL   Triglycerides 108.0 0.0 - 149.0 mg/dL   HDL 44.00 >39.00 mg/dL   VLDL 21.6 0.0 - 40.0 mg/dL   LDL Cholesterol 113 (H) 0 - 99 mg/dL   Total CHOL/HDL Ratio 4    NonHDL 135.02     Assessment and Plan:     ICD-10-CM   1. Healthcare maintenance  Z00.00    Declines flu shot, o/w doing really well  Health Maintenance Exam: The patient's preventative maintenance and recommended screening tests for an annual wellness exam were reviewed in full today. Brought up to date unless services declined.  Counselled on the importance of diet, exercise, and its role in overall health and mortality. The patient's FH and SH was reviewed, including their home life, tobacco status, and drug and alcohol status.  Follow-up in 1 year for physical exam or additional follow-up below.  I have personally reviewed the Medicare Annual Wellness questionnaire and have noted 1. The patient's medical and social history 2. Their use of alcohol, tobacco or illicit drugs 3. Their current medications and supplements 4. The patient's functional ability including ADL's, fall risks, home safety risks and hearing or visual             impairment. 5. Diet and physical activities 6. Evidence for depression or  mood disorders 7. Reviewed Updated provider list, see scanned forms and [facility] Snapshot.  8. Reviewed whether or not the patient has HCPOA or living will, and discussed what this means with the patient.  Recommended he  bring in a copy for his chart in [facility].  The patients weight, height, BMI and visual acuity have been recorded in the chart I have made referrals, counseling and provided education to the patient based review of the above and I have provided the pt with a written personalized care plan for preventive services.  I have provided the patient with a copy of your personalized plan for preventive services. Instructed to take the time to review along with their updated medication list.  Follow-up: No follow-ups on file. Or follow-up in 1 year if not noted.  No future appointments.  No orders of the defined types were placed in this encounter.  Medications Discontinued During This Encounter  Medication Reason  . fluticasone (FLONASE) 50 MCG/ACT nasal spray Completed Course   No orders of the defined types were placed in this encounter.   Signed,  Maud Deed. Tamiah Dysart, MD   Allergies as of 02/27/2019   No Known Allergies     Medication List       Accurate as of February 27, 2019  8:37 AM. If you have any questions, ask your nurse or doctor.        STOP taking these medications   fluticasone 50 MCG/ACT nasal spray Commonly known as: FLONASE Stopped by: Owens Loffler, MD     TAKE these medications   aspirin 81 MG tablet Take 81 mg by mouth daily.   CALCIUM 600 PO Take 1 tablet by mouth daily.   ibuprofen 200 MG tablet Commonly known as: ADVIL Take 200 mg by mouth every 6 (six) hours as needed.   losartan 25 MG tablet Commonly known as: COZAAR Take 0.5 tablets (12.5 mg total) by mouth daily.   multivitamin tablet Take 1 tablet by mouth daily.   pravastatin 40 MG tablet Commonly known as: PRAVACHOL Take 1 tablet by mouth once daily

## 2019-03-06 DIAGNOSIS — R69 Illness, unspecified: Secondary | ICD-10-CM | POA: Diagnosis not present

## 2019-04-18 ENCOUNTER — Ambulatory Visit: Payer: Medicare HMO | Admitting: Thoracic Surgery (Cardiothoracic Vascular Surgery)

## 2019-05-02 NOTE — Telephone Encounter (Signed)
Called patient regarding scheduling his Cardiac Ct. Patient is very confused after back and forth visits between our office and Dr. Roxan Hockey. At this timepatient would like to wait until his echo and fu before he does any other testing.   Please advise if there is anything else

## 2019-05-06 ENCOUNTER — Encounter: Payer: Self-pay | Admitting: Family Medicine

## 2019-05-12 ENCOUNTER — Other Ambulatory Visit: Payer: Self-pay | Admitting: Family Medicine

## 2019-05-15 DIAGNOSIS — R69 Illness, unspecified: Secondary | ICD-10-CM | POA: Diagnosis not present

## 2019-05-16 ENCOUNTER — Ambulatory Visit: Payer: Medicare HMO | Admitting: Thoracic Surgery (Cardiothoracic Vascular Surgery)

## 2019-05-30 ENCOUNTER — Encounter: Payer: Self-pay | Admitting: Family Medicine

## 2019-09-06 ENCOUNTER — Ambulatory Visit (INDEPENDENT_AMBULATORY_CARE_PROVIDER_SITE_OTHER): Payer: Medicare HMO

## 2019-09-06 ENCOUNTER — Other Ambulatory Visit: Payer: Self-pay

## 2019-09-06 DIAGNOSIS — I351 Nonrheumatic aortic (valve) insufficiency: Secondary | ICD-10-CM

## 2019-10-17 NOTE — Progress Notes (Signed)
Cardiology Office Note  Date:  10/18/2019   ID:  Corey Williams, DOB [birth year], MRN [id number]  PCP:  Owens Loffler, MD   Chief Complaint  Patient presents with  . office visit    12 month F/U; Meds verbally reviewed with patient.    HPI:  [name] is a  74 year old gentleman with history of  hyperlipidemia   Aortic valvemoderate to severe regurgitation. unable to exclude bicuspid aortic valve,  mildly dilated Ascending aorta on echocardiogram in 2015 , 3.8 cm in 2019 who presents for follow-up of his aortic valve regurgitation.  Walking 3 miles , pace is moderate,  Feels well,   Echocardiogram June 2021, reviewed in detail today Ejection fraction 55 to 60% Mild to moderately dilated left ventricle,  Normal right heart pressures, normal RV function No significant MR, There is moderate to severe AI Aorta is 3.9 cm  No significant change from 09/2017 Previous echocardiogram pulled up and reviewed  No chest pain, no significant shortness of breath on exertion, no near syncope or lightheadedness Denies leg swelling Sleeping well, no PND orthopnea  Weight stable  Guidelines for aortic valve regurgitation surgery pulled up and discussed, discussed class I and class II indications Discussed things to watch for such as drop in ejection fraction and dilation of the left ventricle  EKG personally reviewed by myself on todays visit Shows NSR rate 63 bpm nonspecific ST abnormality, no change from prior EKG   PMH:   has a past medical history of Aortic insufficiency, Ascending aorta dilatation ([place]), Dilated aortic root ([place]), Hereditary essential tremor (12/28/2013), Hyperlipidemia, and Hypertension.  PSH:    Past Surgical History:  Procedure Laterality Date  . no prior surgery      Current Outpatient Medications  Medication Sig Dispense Refill  . aspirin 81 MG tablet Take 81 mg by mouth daily.      . Calcium Carbonate (CALCIUM 600 PO) Take 1 tablet by mouth daily.    Marland Kitchen  ibuprofen (ADVIL,MOTRIN) 200 MG tablet Take 200 mg by mouth every 6 (six) hours as needed.    . Multiple Vitamin (MULTIVITAMIN) tablet Take 1 tablet by mouth daily.      . pravastatin (PRAVACHOL) 40 MG tablet Take 1 tablet by mouth once daily 90 tablet 3  . losartan (COZAAR) 25 MG tablet Take 0.5 tablets (12.5 mg total) by mouth daily. (Patient not taking: Reported on 10/18/2019) 45 tablet 3   No current facility-administered medications for this visit.    Allergies:   Patient has no known allergies.   Social History:  The patient  reports that he quit smoking about 45 years ago. He has never used smokeless tobacco. He reports current alcohol use of about 5.0 standard drinks of alcohol per week. He reports that he does not use drugs.   Family History:   family history includes Stroke in his father.    Review of Systems: Review of Systems  Constitutional: Negative.   HENT: Negative.   Respiratory: Negative.   Cardiovascular: Negative.   Gastrointestinal: Negative.   Musculoskeletal: Negative.   Neurological: Negative.   Psychiatric/Behavioral: Negative.   All other systems reviewed and are negative.   PHYSICAL EXAM: VS:  BP (!) 132/64 (BP Location: Left Arm, Patient Position: Sitting, Cuff Size: Normal)   Pulse 63   Ht 6' (1.829 m)   Wt (!) 214 lb (97.1 kg)   SpO2 96%   BMI 29.02 kg/m  , BMI Body mass index is 29.02 kg/m. Constitutional:  oriented to person, place, and time. No distress.  HENT:  Head: Grossly normal Eyes:  no discharge. No scleral icterus.  Neck: No JVD, no carotid bruits  Cardiovascular: Regular rate and rhythm, 2/6 right sternal border diastolic murmur appreciated Pulmonary/Chest: Clear to auscultation bilaterally, no wheezes or rails Abdominal: Soft.  no distension.  no tenderness.  Musculoskeletal: Normal range of motion Neurological:  normal muscle tone. Coordination normal. No atrophy Skin: Skin warm and dry Psychiatric: normal affect,  pleasant  Recent Labs: 02/21/2019: ALT 15; BUN 12; Creatinine, Ser 0.90; Hemoglobin 14.4; Platelets 221.0; Potassium 4.1; Sodium 138    Lipid Panel Lab Results  Component Value Date   CHOL 179 02/21/2019   HDL 44.00 02/21/2019   LDLCALC 113 (H) 02/21/2019   TRIG 108.0 02/21/2019      Wt Readings from Last 3 Encounters:  10/18/19 (!) 214 lb (97.1 kg)  02/27/19 214 lb 8 oz (97.3 kg)  10/27/18 212 lb (96.2 kg)      ASSESSMENT AND PLAN:  Aortic valve regurgitation - Plan: EKG 12-Lead Normal ejection fraction, does not meet criteria for dilated LV in systole,  As is not having symptoms, repeat echocardiogram 1 year has been suggested Discussed class I and class II indications with him for surgery He does not feel that he is ready for surgery by symptoms and by surgical indications I would agree with him He has previously met with CT surgery He might be interested in minimally invasive surgery in the future if that is an option  HYPERCHOLESTEROLEMIA - Plan: EKG 12-Lead Cholesterol is at goal on the current lipid regimen. No changes to the medications were made.  Aneurysm of ascending aorta (HCC) Dilation of aorta appears stable on recent echocardiogram, discussed with him Report reviewed  Long discussion concerning indications for aortic valve surgery, types of valve surgery available in the future if indicated  Total encounter time more than 25 minutes  Greater than 50% was spent in counseling and coordination of care with the patient   Disposition:   F/U  12 months   No orders of the defined types were placed in this encounter.    Signed, Esmond Plants, M.D., Ph.D. 10/18/2019  [place], [place]

## 2019-10-18 ENCOUNTER — Other Ambulatory Visit: Payer: Self-pay

## 2019-10-18 ENCOUNTER — Ambulatory Visit: Payer: Medicare HMO | Admitting: Cardiovascular Disease

## 2019-10-18 ENCOUNTER — Encounter: Payer: Self-pay | Admitting: Cardiovascular Disease

## 2019-10-18 VITALS — BP 132/64 | HR 63 | Ht 72.0 in | Wt 214.0 lb

## 2019-10-18 DIAGNOSIS — I10 Essential (primary) hypertension: Secondary | ICD-10-CM | POA: Diagnosis not present

## 2019-10-18 DIAGNOSIS — I77810 Thoracic aortic ectasia: Secondary | ICD-10-CM | POA: Diagnosis not present

## 2019-10-18 DIAGNOSIS — I351 Nonrheumatic aortic (valve) insufficiency: Secondary | ICD-10-CM

## 2019-10-18 DIAGNOSIS — E7800 Pure hypercholesterolemia, unspecified: Secondary | ICD-10-CM

## 2019-10-18 MED ORDER — LOSARTAN POTASSIUM 25 MG PO TABS: 12.5000 mg | ORAL_TABLET | Freq: Every day | ORAL | 4 refills | Status: DC

## 2019-10-18 NOTE — Patient Instructions (Addendum)
Review website [email or web address]  Medication Instructions:  No changes  If you need a refill on your cardiac medications before your next appointment, please call your pharmacy.    Lab work: No new labs needed   If you have labs (blood work) drawn today and your tests are completely normal, you will receive your results only by: Marland Kitchen MyChart Message (if you have MyChart) OR . A paper copy in the mail If you have any lab test that is abnormal or we need to change your treatment, we will call you to review the results.   Testing/Procedures: Echo for aortic valve regurgitation late next summer 2022 Your physician has requested that you have an echocardiogram. Echocardiography is a painless test that uses sound waves to create images of your heart. It provides your doctor with information about the size and shape of your heart and how well your heart's chambers and valves are working. This procedure takes approximately one hour. There are no restrictions for this procedure.     Follow-Up: At [facility], you and your health needs are our priority.  As part of our continuing mission to provide you with exceptional heart care, we have created designated Provider Care Teams.  These Care Teams include your primary Cardiologist (physician) and Advanced Practice Providers (APPs -  Physician Assistants and Nurse Practitioners) who all work together to provide you with the care you need, when you need it.  . You will need a follow up appointment in 12 months   . Providers on your designated Care Team:   . Murray Hodgkins, NP . Christell Faith, PA-C . Marrianne Mood, PA-C  Any Other Special Instructions Will Be Listed Below (If Applicable).  COVID-19 Vaccine Information can be found at: [email or web address].uk For questions related to vaccine distribution or appointments, please email  [email or web address] or call [phone].

## 2019-10-23 NOTE — Addendum Note (Signed)
Addended by: Raelene Bott, Kenzi Bardwell L on: 10/23/2019 11:34 AM   Modules accepted: Orders

## 2020-03-01 ENCOUNTER — Other Ambulatory Visit: Payer: Self-pay

## 2020-03-01 ENCOUNTER — Other Ambulatory Visit (INDEPENDENT_AMBULATORY_CARE_PROVIDER_SITE_OTHER): Payer: Medicare HMO

## 2020-03-01 ENCOUNTER — Other Ambulatory Visit (INDEPENDENT_AMBULATORY_CARE_PROVIDER_SITE_OTHER): Payer: Medicare HMO | Admitting: Family Medicine

## 2020-03-01 DIAGNOSIS — E7800 Pure hypercholesterolemia, unspecified: Secondary | ICD-10-CM | POA: Diagnosis not present

## 2020-03-01 DIAGNOSIS — Z131 Encounter for screening for diabetes mellitus: Secondary | ICD-10-CM

## 2020-03-01 DIAGNOSIS — Z79899 Other long term (current) drug therapy: Secondary | ICD-10-CM | POA: Diagnosis not present

## 2020-03-01 DIAGNOSIS — Z125 Encounter for screening for malignant neoplasm of prostate: Secondary | ICD-10-CM

## 2020-03-01 DIAGNOSIS — R69 Illness, unspecified: Secondary | ICD-10-CM | POA: Diagnosis not present

## 2020-03-01 LAB — LIPID PANEL
Cholesterol: 169 mg/dL (ref 0–200)
HDL: 40.8 mg/dL (ref >39.00)
LDL Cholesterol: 103 mg/dL — ABNORMAL HIGH (ref 0–99)
NonHDL: 128.67
Total CHOL/HDL Ratio: 4
Triglycerides: 130 mg/dL (ref 0.0–149.0)
VLDL: 26 mg/dL (ref 0.0–40.0)

## 2020-03-01 LAB — BASIC METABOLIC PANEL
BUN: 11 mg/dL (ref 6–23)
CO2: 27 mEq/L (ref 19–32)
Calcium: 9.1 mg/dL (ref 8.4–10.5)
Chloride: 104 mEq/L (ref 96–112)
Creatinine, Ser: 0.98 mg/dL (ref 0.40–1.50)
GFR: 76.04 mL/min (ref >60.00)
Glucose, Bld: 91 mg/dL (ref 70–99)
Potassium: 4.2 mEq/L (ref 3.5–5.1)
Sodium: 139 mEq/L (ref 135–145)

## 2020-03-01 LAB — CBC WITH DIFFERENTIAL/PLATELET
Basophils Absolute: 0 10*3/uL (ref 0.0–0.1)
Basophils Relative: 0.2 % (ref 0.0–3.0)
Eosinophils Absolute: 0.3 10*3/uL (ref 0.0–0.7)
Eosinophils Relative: 6.5 % — ABNORMAL HIGH (ref 0.0–5.0)
HCT: 41.5 % (ref 39.0–52.0)
Hemoglobin: 13.8 g/dL (ref 13.0–17.0)
Lymphocytes Relative: 22 % (ref 12.0–46.0)
Lymphs Abs: 1.1 10*3/uL (ref 0.7–4.0)
MCHC: 33.3 g/dL (ref 30.0–36.0)
MCV: 96.1 fl (ref 78.0–100.0)
Monocytes Absolute: 0.5 10*3/uL (ref 0.1–1.0)
Monocytes Relative: 8.8 % (ref 3.0–12.0)
Neutro Abs: 3.2 10*3/uL (ref 1.4–7.7)
Neutrophils Relative %: 62.5 % (ref 43.0–77.0)
Platelets: 219 10*3/uL (ref 150.0–400.0)
RBC: 4.32 Mil/uL (ref 4.22–5.81)
RDW: 13 % (ref 11.5–15.5)
WBC: 5.1 10*3/uL (ref 4.0–10.5)

## 2020-03-01 LAB — HEPATIC FUNCTION PANEL
ALT: 12 U/L (ref 0–53)
AST: 16 U/L (ref 0–37)
Albumin: 4.2 g/dL (ref 3.5–5.2)
Alkaline Phosphatase: 50 U/L (ref 39–117)
Bilirubin, Direct: 0.1 mg/dL (ref 0.0–0.3)
Total Bilirubin: 0.8 mg/dL (ref 0.2–1.2)
Total Protein: 7.2 g/dL (ref 6.0–8.3)

## 2020-03-01 LAB — PSA, MEDICARE: PSA: 0.75 ng/ml (ref 0.10–4.00)

## 2020-03-01 LAB — HEMOGLOBIN A1C: Hgb A1c MFr Bld: 5.7 % (ref 4.6–6.5)

## 2020-03-03 NOTE — Progress Notes (Signed)
Corey Tallie T. Corey Lavallie, MD, [place] at [facility] [place], [address]  Phone: [phone]  FAX: [phone]  Corey Williams - 74 y.o. male  MRN [id number]  Date of Birth: 1945/07/07  Date: 03/04/2020  PCP: Owens Loffler, MD  Referral: Owens Loffler, MD  Chief Complaint  Patient presents with  . Medicare Wellness    This visit occurred during the SARS-CoV-2 public health emergency.  Safety protocols were in place, including screening questions prior to the visit, additional usage of staff PPE, and extensive cleaning of exam room while observing appropriate contact time as indicated for disinfecting solutions.   Patient Care Team: Owens Loffler, MD as PCP - General (Family Medicine) Subjective:   Corey Williams is a 74 y.o. pleasant patient who presents for a medicare wellness examination:  Preventative Health Maintenance Visit:  Health Maintenance Summary Reviewed and updated, unless pt declines services.  Tobacco History Reviewed. Alcohol: No concerns, no excessive use Exercise Habits: Daily 30 mins + walking STD concerns: no risk or activity to increase risk Drug Use: None  Loud murmur - is seeing Dr. Rockey Situ Aortic regurg    Health Maintenance  Topic Date Due  . TETANUS/TDAP  Never done  . COVID-19 Vaccine (4 - Booster for Pfizer series) 06/22/2020  . COLONOSCOPY  02/14/2022  . INFLUENZA VACCINE  Completed  . Hepatitis C Screening  Completed  . PNA vac Low Risk Adult  Completed    Immunization History  Administered Date(s) Administered  . Influenza,inj,Quad PF,6+ Mos 01/17/2013, 12/27/2013, 01/07/2015, 01/08/2016  . Influenza-Unspecified 03/01/2020  . PFIZER SARS-COV-2 Vaccination 05/05/2019, 05/30/2019, 12/23/2019  . Pneumococcal Conjugate-13 12/27/2013  . Pneumococcal Polysaccharide-23 12/22/2012    Patient Active Problem List   Diagnosis Date Noted   . Hereditary essential tremor 12/28/2013  . Aortic valve regurgitation 10/13/2013  . Aneurysm of ascending aorta ([place]) 10/13/2013  . HYPERCHOLESTEROLEMIA 06/05/2010  . ACTINIC KERATOSIS, HEAD 06/05/2010    Past Medical History:  Diagnosis Date  . Aortic insufficiency   . Ascending aorta dilatation (HCC)   . Dilated aortic root ([place])   . Hereditary essential tremor 12/28/2013  . Hyperlipidemia   . Hypertension     Past Surgical History:  Procedure Laterality Date  . no prior surgery      Family History  Problem Relation Age of Onset  . Stroke Father   . Colon cancer Neg Hx   . Stomach cancer Neg Hx     Past Medical History, Surgical History, Social History, Family History, Problem List, Medications, and Allergies have been reviewed and updated if relevant.  Review of Systems: Pertinent positives are listed above.  Otherwise, a full 14 point review of systems has been done in full and it is negative except where it is noted positive.  Objective:   BP 130/68 (BP Location: Left Arm, Patient Position: Sitting, Cuff Size: Normal)   Pulse 71   Temp 97.7 F (36.5 C)   Ht 5' 11.75" (1.822 m)   Wt 211 lb (95.7 kg)   SpO2 96%   BMI 28.82 kg/m  Fall Risk  03/04/2020 02/27/2019 02/23/2018 01/11/2017 01/08/2016  Falls in the past year? 1 0 0 No No  Number falls in past yr: 0 - - - -  Injury with Fall? 1 - - - -  Risk for fall due to : No Fall Risks - - - -  Follow up Falls evaluation completed - - - -  Ideal Body Weight: Weight in (lb) to have BMI = 25: 182.7  Hearing Screening   125Hz  250Hz  500Hz  1000Hz  2000Hz  3000Hz  4000Hz  6000Hz  8000Hz   Right ear:           Left ear:             Visual Acuity Screening   Right eye Left eye Both eyes  Without correction:     With correction: 20/20 20/20 20/20    Depression screen [facility] 2/9 03/04/2020 02/27/2019 02/23/2018 01/11/2017 01/08/2016  Decreased Interest 0 0 0 0 0  Down, Depressed, Hopeless 0 0 0 0 0  PHQ - 2 Score 0 0 0 0 0      GEN: well developed, well nourished, no acute distress Eyes: conjunctiva and lids normal, PERRLA, EOMI ENT: TM clear, nares clear, oral exam WNL Neck: supple, no lymphadenopathy, no thyromegaly, no JVD Pulm: clear to auscultation and percussion, respiratory effort normal CV: regular rate and rhythm, No rub or gallop,  Loud murmur , no cyanosis, clubbing, edema or varicosities GI: soft, non-tender; no hepatosplenomegaly, masses; active bowel sounds all quadrants GU: deferred Lymph: no cervical, axillary or inguinal adenopathy MSK: gait normal, muscle tone and strength WNL, no joint swelling, effusions, discoloration, crepitus  SKIN: clear, good turgor, color WNL, no rashes, lesions, or ulcerations Neuro: normal mental status, normal strength, sensation, and motion Psych: alert; oriented to person, place and time, normally interactive and not anxious or depressed in appearance.  All labs reviewed with patient.  Results for orders placed or performed in visit on 03/01/20  Lipid panel  Result Value Ref Range   Cholesterol 169 0 - 200 mg/dL   Triglycerides 130.0 0.0 - 149.0 mg/dL   HDL 40.80 >39.00 mg/dL   VLDL 26.0 0.0 - 40.0 mg/dL   LDL Cholesterol 103 (H) 0 - 99 mg/dL   Total CHOL/HDL Ratio 4    NonHDL 128.67   Hepatic function panel  Result Value Ref Range   Total Bilirubin 0.8 0.2 - 1.2 mg/dL   Bilirubin, Direct 0.1 0.0 - 0.3 mg/dL   Alkaline Phosphatase 50 39 - 117 U/L   AST 16 0 - 37 U/L   ALT 12 0 - 53 U/L   Total Protein 7.2 6.0 - 8.3 g/dL   Albumin 4.2 3.5 - 5.2 g/dL  Basic metabolic panel  Result Value Ref Range   Sodium 139 135 - 145 mEq/L   Potassium 4.2 3.5 - 5.1 mEq/L   Chloride 104 96 - 112 mEq/L   CO2 27 19 - 32 mEq/L   Glucose, Bld 91 70 - 99 mg/dL   BUN 11 6 - 23 mg/dL   Creatinine, Ser 0.98 0.40 - 1.50 mg/dL   GFR 76.04 >60.00 mL/min   Calcium 9.1 8.4 - 10.5 mg/dL  CBC with Differential/Platelet  Result Value Ref Range   WBC 5.1 4.0 - 10.5 K/uL    RBC 4.32 4.22 - 5.81 Mil/uL   Hemoglobin 13.8 13.0 - 17.0 g/dL   HCT 41.5 39.0 - 52.0 %   MCV 96.1 78.0 - 100.0 fl   MCHC 33.3 30.0 - 36.0 g/dL   RDW 13.0 11.5 - 15.5 %   Platelets 219.0 150.0 - 400.0 K/uL   Neutrophils Relative % 62.5 43.0 - 77.0 %   Lymphocytes Relative 22.0 12.0 - 46.0 %   Monocytes Relative 8.8 3.0 - 12.0 %   Eosinophils Relative 6.5 (H) 0.0 - 5.0 %   Basophils Relative 0.2 0.0 - 3.0 %  Neutro Abs 3.2 1.4 - 7.7 K/uL   Lymphs Abs 1.1 0.7 - 4.0 K/uL   Monocytes Absolute 0.5 0.1 - 1.0 K/uL   Eosinophils Absolute 0.3 0.0 - 0.7 K/uL   Basophils Absolute 0.0 0.0 - 0.1 K/uL  Hemoglobin A1c  Result Value Ref Range   Hgb A1c MFr Bld 5.7 4.6 - 6.5 %  PSA, Medicare  Result Value Ref Range   PSA 0.75 0.10 - 4.00 ng/ml    Assessment and Plan:     ICD-10-CM   1. Healthcare maintenance  Z00.00    He is followed by Dr. Rockey Situ for aortic regurgitation - mod to severe  Rec Td - he is going to check his records at home  Health Maintenance Exam: The patient's preventative maintenance and recommended screening tests for an annual wellness exam were reviewed in full today. Brought up to date unless services declined.  Counselled on the importance of diet, exercise, and its role in overall health and mortality. The patient's FH and SH was reviewed, including their home life, tobacco status, and drug and alcohol status.  Follow-up in 1 year for physical exam or additional follow-up below.  I have personally reviewed the Medicare Annual Wellness questionnaire and have noted 1. The patient's medical and social history 2. Their use of alcohol, tobacco or illicit drugs 3. Their current medications and supplements 4. The patient's functional ability including ADL's, fall risks, home safety risks and hearing or visual             impairment. 5. Diet and physical activities 6. Evidence for depression or mood disorders 7. Reviewed Updated provider list, see scanned forms  and [facility] Snapshot.  8. Reviewed whether or not the patient has HCPOA or living will, and discussed what this means with the patient.  Recommended he bring in a copy for his chart in [facility].  The patients weight, height, BMI and visual acuity have been recorded in the chart I have made referrals, counseling and provided education to the patient based review of the above and I have provided the pt with a written personalized care plan for preventive services.  I have provided the patient with a copy of your personalized plan for preventive services. Instructed to take the time to review along with their updated medication list.  Follow-up: No follow-ups on file. Or follow-up in 1 year if not noted.  No future appointments.  No orders of the defined types were placed in this encounter.  There are no discontinued medications. No orders of the defined types were placed in this encounter.   Signed,  Maud Deed. Anjannette Gauger, MD   Allergies as of 03/04/2020   No Known Allergies     Medication List       Accurate as of March 04, 2020  9:08 AM. If you have any questions, ask your nurse or doctor.        aspirin 81 MG tablet Take 81 mg by mouth daily.   CALCIUM 600 PO Take 1 tablet by mouth daily.   ibuprofen 200 MG tablet Commonly known as: ADVIL Take 200 mg by mouth every 6 (six) hours as needed.   losartan 25 MG tablet Commonly known as: COZAAR Take 0.5 tablets (12.5 mg total) by mouth daily.   multivitamin tablet Take 1 tablet by mouth daily.   pravastatin 40 MG tablet Commonly known as: PRAVACHOL Take 1 tablet by mouth once daily

## 2020-03-04 ENCOUNTER — Other Ambulatory Visit: Payer: Self-pay

## 2020-03-04 ENCOUNTER — Encounter: Payer: Self-pay | Admitting: Family Medicine

## 2020-03-04 ENCOUNTER — Ambulatory Visit (INDEPENDENT_AMBULATORY_CARE_PROVIDER_SITE_OTHER): Payer: Medicare HMO | Admitting: Family Medicine

## 2020-03-04 VITALS — BP 130/68 | HR 71 | Temp 97.7°F | Ht 71.75 in | Wt 211.0 lb

## 2020-03-04 DIAGNOSIS — Z0000 Encounter for general adult medical examination without abnormal findings: Secondary | ICD-10-CM | POA: Diagnosis not present

## 2020-05-20 ENCOUNTER — Other Ambulatory Visit: Payer: Self-pay | Admitting: Family Medicine

## 2020-06-05 IMAGING — CT CT ANGIOGRAPHY CHEST
3 of 6 series · 18 of 36 positions shown · IV contrast (omnipaque)
Comparison: None.

CLINICAL DATA: 72-year-old male with possible aortic dilation seen
on echocardiogram

EXAM:
CT ANGIOGRAPHY CHEST WITH CONTRAST
TECHNIQUE: Multidetector CT imaging of the chest was performed using the
standard protocol during bolus administration of intravenous
contrast. Multiplanar CT image reconstructions and MIPs were
obtained to evaluate the vascular anatomy.
CONTRAST:  75mL OMNIPAQUE IOHEXOL 350 MG/ML SOLN

[Series 4: axial cta thorax · axial · 0.79mm/px · z∈[-1246,-960]mm · 12 of 171 slices shown]
[im 14/171  lung]
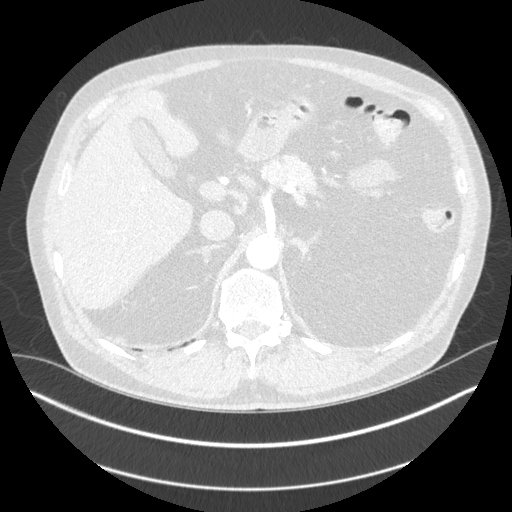
[im 27/171  mediastinal]
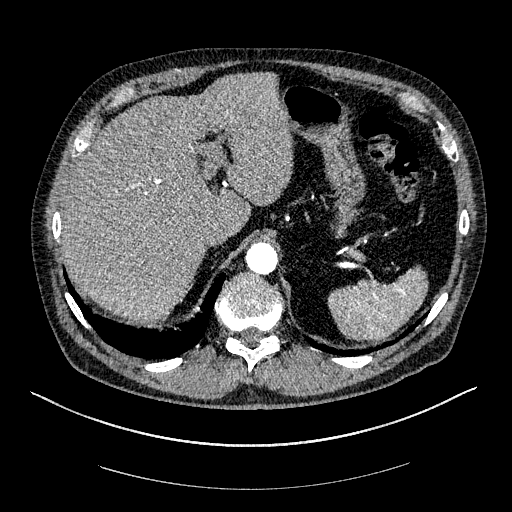
[im 40/171  lung]
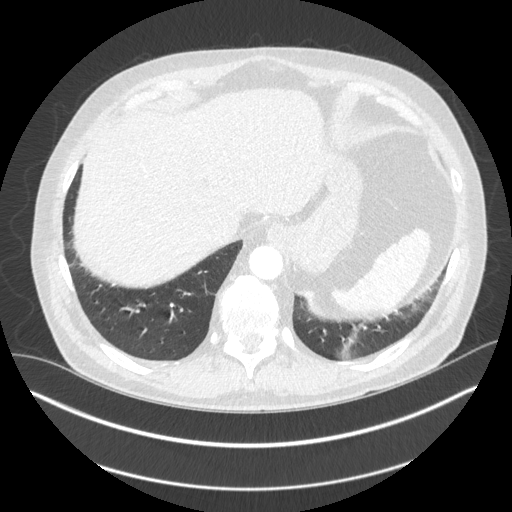
[im 53/171  mediastinal]
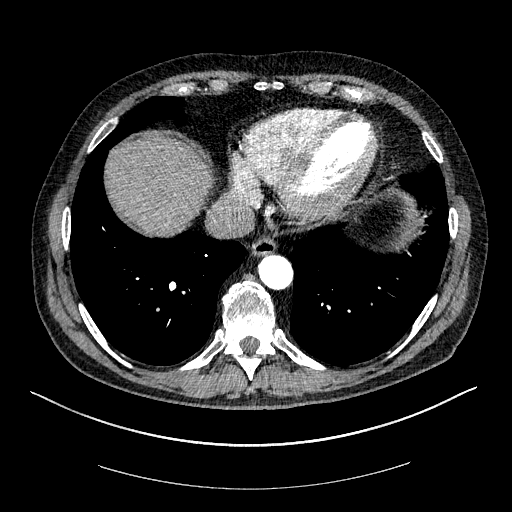
[im 66/171  lung]
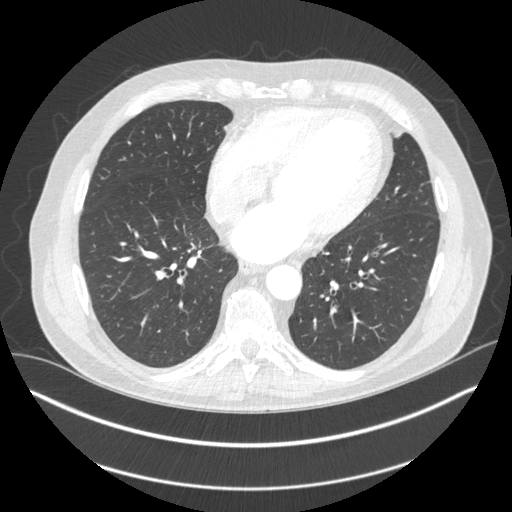
[im 79/171  mediastinal]
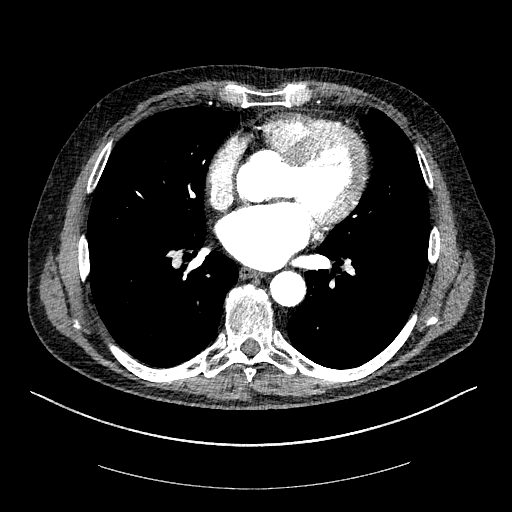
[im 92/171  lung]
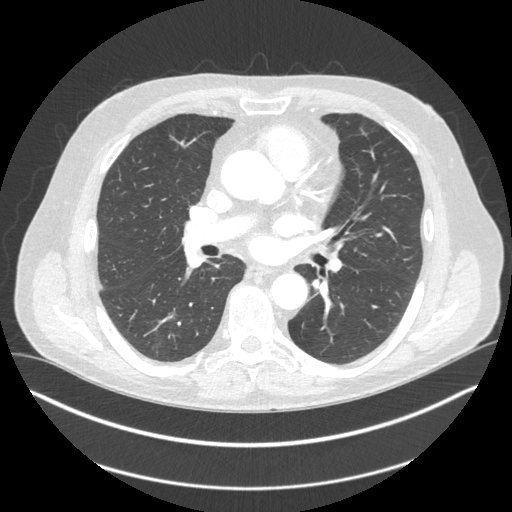
[im 105/171  mediastinal]
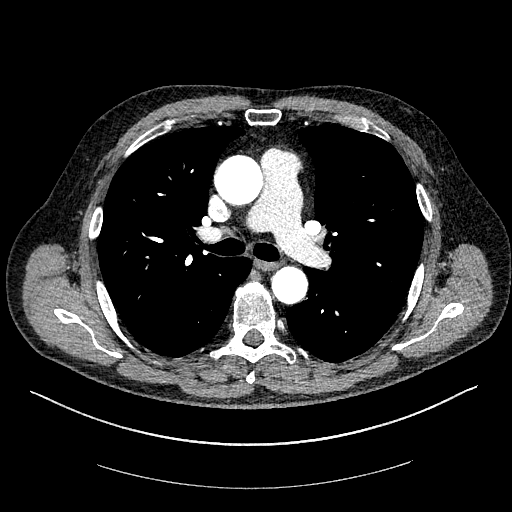
[im 118/171  lung]
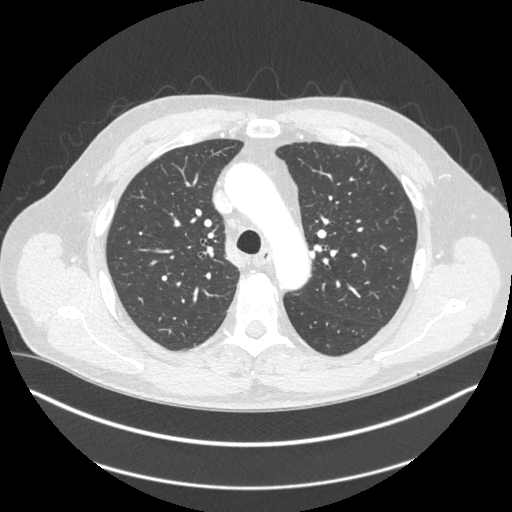
[im 131/171  mediastinal]
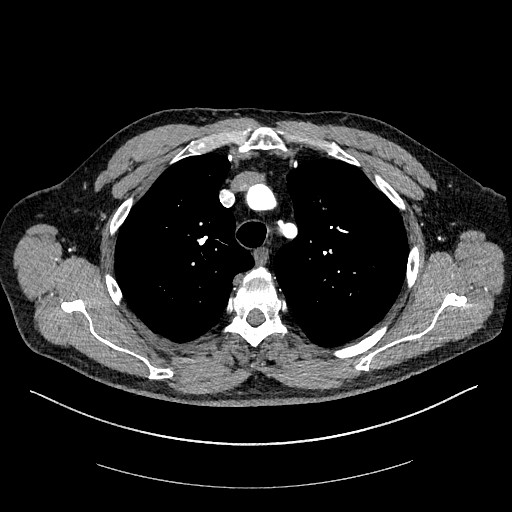
[im 144/171  lung]
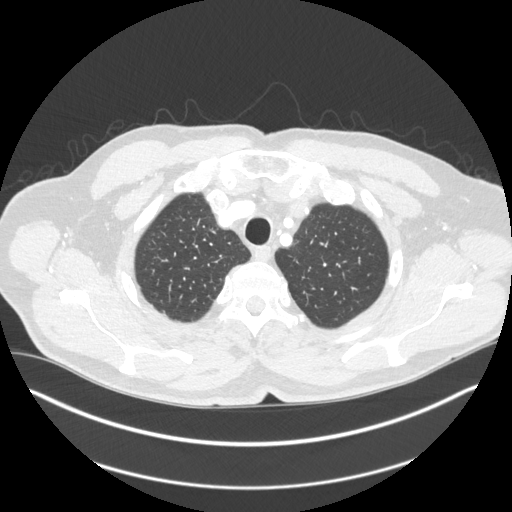
[im 157/171  mediastinal]
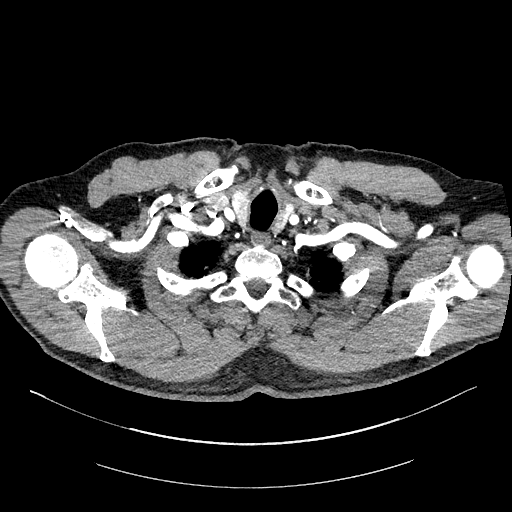

[Series 5: axial lung cta thorax · axial · 0.79mm/px · z∈[-1246,-1090]mm · 5 of 171 slices shown]
[im 14/171  mediastinal]
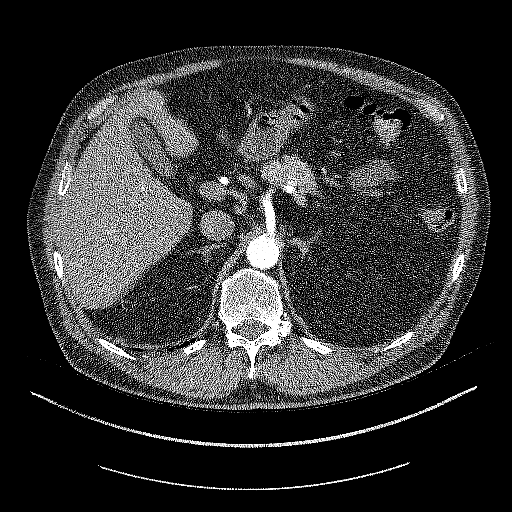
[im 40/171  mediastinal]
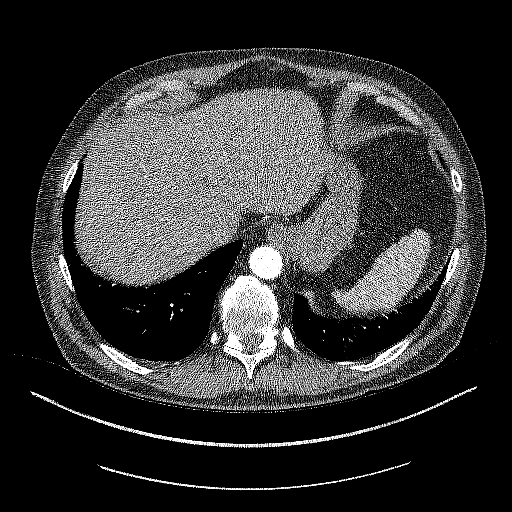
[im 53/171  mediastinal]
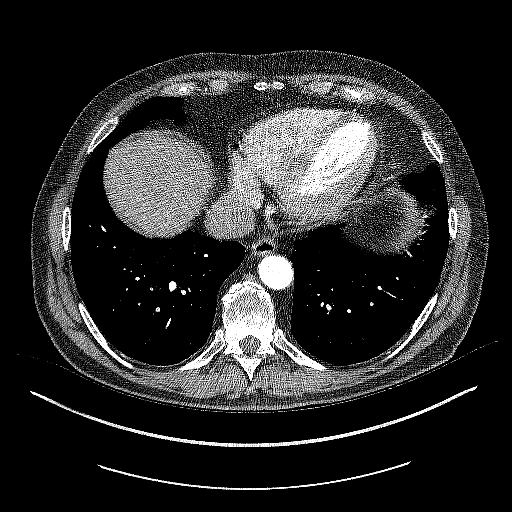
[im 79/171  mediastinal]
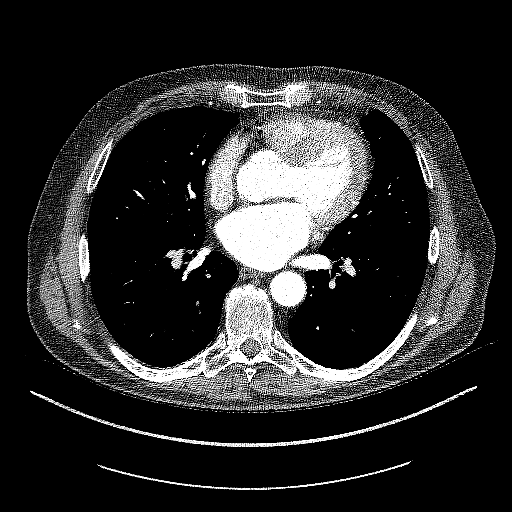
[im 92/171  mediastinal]
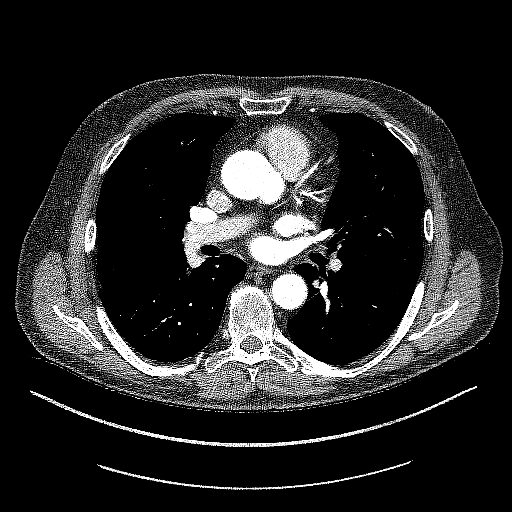

[Series 7: cor st cta thorax · coronal · 0.67mm/px · 1 of 149 slices shown]
[im 75/149  mediastinal]
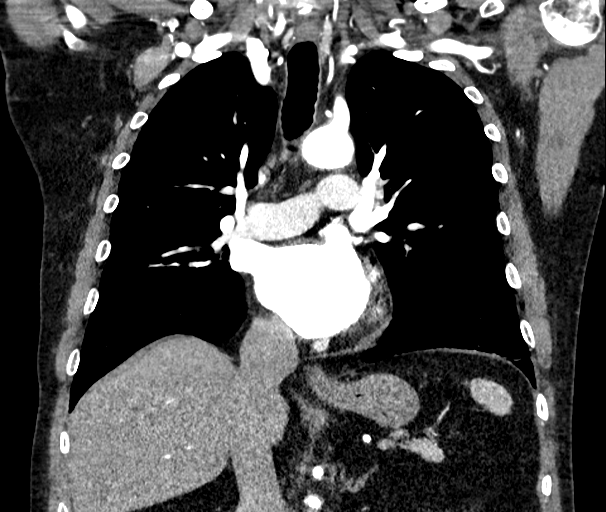

[18 of 36 positions shown; findings below may reference images not displayed]

FINDINGS: Cardiovascular: Variant arch anatomy. The brachiocephalic and left
common carotid artery share a common origin. The vertebral artery
arises directly from the aorta. No significant atherosclerotic
plaque, dissection or irregular thrombus. There is mild dilation of
the aortic root which measures a maximum of 4.6 cm at the sinuses of
Valsalva. The tubular portion of the ascending thoracic aorta is
mildly ectatic at 3.8 cm. No effacement of the Hayatou
junction. Faint calcifications visualized along the coronary
arteries. The heart is normal in size. No evidence of atrial
thrombus. No pericardial effusion. The main pulmonary artery is
normal in caliber.

Mediastinum/Nodes: Unremarkable CT appearance of the thyroid gland.
No suspicious mediastinal or hilar adenopathy. No soft tissue
mediastinal mass. The thoracic esophagus is unremarkable.

Lungs/Pleura: Small cluster of tree-in-bud micro nodularity in the
dependent aspect of the right lower lobe. None of the individual
nodules measure larger than 3 mm. Trace dependent atelectasis.

Upper Abdomen: No acute abnormality. Circumscribed low-attenuation
lesion in hepatic segment 8 is too small for accurate
characterization but statistically highly likely a benign cyst.
Incompletely imaged low-attenuation lesion exophytic from the upper
pole of the right kidney also likely represents a cyst.

Musculoskeletal: No acute fracture or aggressive appearing lytic or
blastic osseous lesion.

Review of the MIP images confirms the above findings.
IMPRESSION: 1. Dilated aortic root measuring up to 4.6 cm at the sinuses of
Valsalva.
2. Ectatic ascending thoracic aorta with a maximal diameter of
cm.
3. Coronary artery calcifications.
4. Variant arch vessel anatomy.
5. Probable hepatic and right renal cysts.

## 2021-01-03 ENCOUNTER — Other Ambulatory Visit: Payer: Self-pay | Admitting: Cardiovascular Disease

## 2021-01-03 NOTE — Telephone Encounter (Signed)
Scheduled

## 2021-01-03 NOTE — Telephone Encounter (Signed)
Please schedule office visit for refills. Thank you! 

## 2021-01-31 ENCOUNTER — Telehealth: Payer: Self-pay | Admitting: Cardiovascular Disease

## 2021-01-31 DIAGNOSIS — I351 Nonrheumatic aortic (valve) insufficiency: Secondary | ICD-10-CM

## 2021-01-31 DIAGNOSIS — I77819 Aortic ectasia, unspecified site: Secondary | ICD-10-CM

## 2021-01-31 DIAGNOSIS — I7121 Aneurysm of the ascending aorta, without rupture: Secondary | ICD-10-CM

## 2021-01-31 NOTE — Telephone Encounter (Signed)
Pt has missed 1 year f/u and repeat ECHO for summer 2022  Per AVS 09/2019 Testing/Procedures: Echo for aortic valve regurgitation late next summer 2022  Have replaced the order, pt need ECHO then f/u with Dr. Rockey Situ after results.

## 2021-01-31 NOTE — Telephone Encounter (Signed)
Patient was overdue for echo but order auto cancelled as expired .  Being seen Monday by Gollan  .  Do we wanna see them or cancel until echo ?

## 2021-02-02 NOTE — Progress Notes (Deleted)
Cardiology Office Note  Date:  02/02/2021   ID:  KRUZE ATCHLEY, DOB 12-Jun-1945, MRN [id number]  PCP:  Owens Loffler, MD   No chief complaint on file.   HPI:  [name] is a  75 year old gentleman with history of  hyperlipidemia   Aortic valvemoderate to severe regurgitation. unable to exclude bicuspid aortic valve,  mildly dilated Ascending aorta on echocardiogram in 2015 , 3.8 cm in 2019 who presents for follow-up of his aortic valve regurgitation.  Last seen in clinic by myself July 2021  Last echocardiogramJune 2021,  Ejection fraction 55 to 60% Mild to moderately dilated left ventricle,  Normal right heart pressures, normal RV function No significant MR, There is moderate to severe AI Aorta is 3.9 cm  No significant change from 09/2017 Previous echocardiogram pulled up and reviewed  No chest pain, no significant shortness of breath on exertion, no near syncope or lightheadedness Denies leg swelling Sleeping well, no PND orthopnea  Weight stable  Guidelines for aortic valve regurgitation surgery pulled up and discussed, discussed class I and class II indications Discussed things to watch for such as drop in ejection fraction and dilation of the left ventricle  EKG personally reviewed by myself on todays visit Shows NSR rate 63 bpm nonspecific ST abnormality, no change from prior EKG   PMH:   has a past medical history of Aortic insufficiency, Ascending aorta dilatation ([place]), Dilated aortic root ([place]), Hereditary essential tremor (12/28/2013), Hyperlipidemia, and Hypertension.  PSH:    Past Surgical History:  Procedure Laterality Date   no prior surgery      Current Outpatient Medications  Medication Sig Dispense Refill   aspirin 81 MG tablet Take 81 mg by mouth daily.     Calcium Carbonate (CALCIUM 600 PO) Take 1 tablet by mouth daily.     ibuprofen (ADVIL,MOTRIN) 200 MG tablet Take 200 mg by mouth every 6 (six) hours as needed.     losartan (COZAAR) 25 MG  tablet Take 1/2 (one-half) tablet by mouth once daily 45 tablet 0   Multiple Vitamin (MULTIVITAMIN) tablet Take 1 tablet by mouth daily.     pravastatin (PRAVACHOL) 40 MG tablet Take 1 tablet by mouth once daily 90 tablet 3   No current facility-administered medications for this visit.    Allergies:   Patient has no known allergies.   Social History:  The patient  reports that he quit smoking about 47 years ago. He has never used smokeless tobacco. He reports current alcohol use of about 5.0 standard drinks per week. He reports that he does not use drugs.   Family History:   family history includes Stroke in his father.    Review of Systems: Review of Systems  Constitutional: Negative.   HENT: Negative.    Respiratory: Negative.    Cardiovascular: Negative.   Gastrointestinal: Negative.   Musculoskeletal: Negative.   Neurological: Negative.   Psychiatric/Behavioral: Negative.    All other systems reviewed and are negative.  PHYSICAL EXAM: VS:  There were no vitals taken for this visit. , BMI There is no height or weight on file to calculate BMI. Constitutional:  oriented to person, place, and time. No distress.  HENT:  Head: Grossly normal Eyes:  no discharge. No scleral icterus.  Neck: No JVD, no carotid bruits  Cardiovascular: Regular rate and rhythm, 2/6 right sternal border diastolic murmur appreciated Pulmonary/Chest: Clear to auscultation bilaterally, no wheezes or rails Abdominal: Soft.  no distension.  no tenderness.  Musculoskeletal: Normal range  of motion Neurological:  normal muscle tone. Coordination normal. No atrophy Skin: Skin warm and dry Psychiatric: normal affect, pleasant  Recent Labs: 03/01/2020: ALT 12; BUN 11; Creatinine, Ser 0.98; Hemoglobin 13.8; Platelets 219.0; Potassium 4.2; Sodium 139    Lipid Panel Lab Results  Component Value Date   CHOL 169 03/01/2020   HDL 40.80 03/01/2020   LDLCALC 103 (H) 03/01/2020   TRIG 130.0 03/01/2020       Wt Readings from Last 3 Encounters:  03/04/20 211 lb (95.7 kg)  10/18/19 (!) 214 lb (97.1 kg)  02/27/19 214 lb 8 oz (97.3 kg)      ASSESSMENT AND PLAN:  Aortic valve regurgitation - Plan: EKG 12-Lead Normal ejection fraction, does not meet criteria for dilated LV in systole,  As is not having symptoms, repeat echocardiogram 1 year has been suggested Discussed class I and class II indications with him for surgery He does not feel that he is ready for surgery by symptoms and by surgical indications I would agree with him He has previously met with CT surgery He might be interested in minimally invasive surgery in the future if that is an option  HYPERCHOLESTEROLEMIA - Plan: EKG 12-Lead Cholesterol is at goal on the current lipid regimen. No changes to the medications were made.  Aneurysm of ascending aorta (HCC) Dilation of aorta appears stable on recent echocardiogram, discussed with him Report reviewed  Long discussion concerning indications for aortic valve surgery, types of valve surgery available in the future if indicated  Total encounter time more than 25 minutes  Greater than 50% was spent in counseling and coordination of care with the patient   Disposition:   F/U  12 months   No orders of the defined types were placed in this encounter.    Signed, Esmond Plants, M.D., Ph.D. 02/02/2021  [place], [place]

## 2021-02-03 ENCOUNTER — Ambulatory Visit: Payer: Medicare HMO | Admitting: Cardiovascular Disease

## 2021-02-03 DIAGNOSIS — I10 Essential (primary) hypertension: Secondary | ICD-10-CM

## 2021-02-03 DIAGNOSIS — I351 Nonrheumatic aortic (valve) insufficiency: Secondary | ICD-10-CM

## 2021-02-03 DIAGNOSIS — I77819 Aortic ectasia, unspecified site: Secondary | ICD-10-CM

## 2021-02-03 DIAGNOSIS — E7800 Pure hypercholesterolemia, unspecified: Secondary | ICD-10-CM

## 2021-02-26 ENCOUNTER — Other Ambulatory Visit: Payer: Medicare HMO

## 2021-02-26 ENCOUNTER — Ambulatory Visit: Payer: Medicare HMO | Admitting: Podiatry

## 2021-03-03 ENCOUNTER — Other Ambulatory Visit: Payer: Self-pay | Admitting: Family Medicine

## 2021-03-03 DIAGNOSIS — Z125 Encounter for screening for malignant neoplasm of prostate: Secondary | ICD-10-CM

## 2021-03-03 DIAGNOSIS — E7800 Pure hypercholesterolemia, unspecified: Secondary | ICD-10-CM

## 2021-03-03 DIAGNOSIS — Z79899 Other long term (current) drug therapy: Secondary | ICD-10-CM

## 2021-03-03 DIAGNOSIS — Z20822 Contact with and (suspected) exposure to covid-19: Secondary | ICD-10-CM | POA: Diagnosis not present

## 2021-03-05 ENCOUNTER — Encounter: Payer: Medicare HMO | Admitting: Family Medicine

## 2021-03-10 ENCOUNTER — Ambulatory Visit (INDEPENDENT_AMBULATORY_CARE_PROVIDER_SITE_OTHER): Payer: Medicare HMO

## 2021-03-10 ENCOUNTER — Other Ambulatory Visit: Payer: Self-pay

## 2021-03-10 DIAGNOSIS — I351 Nonrheumatic aortic (valve) insufficiency: Secondary | ICD-10-CM

## 2021-03-10 DIAGNOSIS — I7121 Aneurysm of the ascending aorta, without rupture: Secondary | ICD-10-CM

## 2021-03-10 DIAGNOSIS — I77819 Aortic ectasia, unspecified site: Secondary | ICD-10-CM

## 2021-03-10 LAB — ECHOCARDIOGRAM COMPLETE
AR max vel: 3.33 cm2
AV Area VTI: 3.53 cm2
AV Area mean vel: 3.76 cm2
AV Mean grad: 3 mmHg
AV Peak grad: 6.3 mmHg
AV Vena cont: 1.6 cm
Ao pk vel: 1.25 m/s
Area-P 1/2: 2.8 cm2
Calc EF: 59.6 %
P 1/2 time: 464 msec
S' Lateral: 4.4 cm
Single Plane A2C EF: 65 %
Single Plane A4C EF: 53.3 %

## 2021-03-10 NOTE — Progress Notes (Signed)
Cardiology Office Note  Date:  03/11/2021   ID:  Corey Williams, DOB 10-06-1945, MRN [id number]  PCP:  Owens Loffler, MD   Chief Complaint  Patient presents with   Follow-up    Annual 1 year follow up. Medications verbally reviewed with patient.     HPI:  Corey Williams is a  75 year old gentleman with history of  hyperlipidemia   Aortic valve moderate to severe regurgitation. unable to exclude bicuspid aortic valve,  borderline dilated Ascending aorta on echocardiogram in 2015 , 3.8 cm in 2019 who presents for follow-up of his aortic valve regurgitation.  Recent stressors at home, daughter-in-law died suddenly 2 weeks ago 2 children, age 35 and 93  Covid positive last week, sx for one day He was fully vaccinated  Feels fine now   no SOB, walking regularly , not winded Denies edema  No chest pain, no significant shortness of breath on exertion, no near syncope or lightheadedness Denies leg swelling Sleeping well, no PND orthopnea Weight stable  BP elevated today 244 systolic Checks at home  EKG personally reviewed by myself on todays visit Shows NSR rate 63 bpm nonspecific ST abnormality, no change from prior EKG  Echo yesterday ,  details discussed on today's visit  1. Left ventricular ejection fraction, by estimation, is 50 to 55%. The  left ventricle has low normal function. The left ventricle has no regional  wall motion abnormalities. The left ventricular internal cavity size was  mildly dilated. Left ventricular  diastolic parameters are consistent with Grade I diastolic dysfunction  (impaired relaxation).   2. Right ventricular systolic function is low normal. The right  ventricular size is normal.   3. The mitral valve is normal in structure. No evidence of mitral valve  regurgitation.   4. Aortic valve structure not well visualized. recommend TEE or CMR for  better quantitation of Aortic valve regurgitation if clinically  warranted.. The aortic valve was not  well visualized. Aortic valve  regurgitation is moderate to severe.   5. Aortic dilatation noted. There is mild dilatation of the aortic root,  measuring 40 mm. There is mild dilatation of the ascending aorta,  measuring 41 mm.   Guidelines for aortic valve regurgitation surgery pulled up and discussed, discussed class I and class II indications Discussed things to watch for such as drop in ejection fraction and dilation of the left ventricle   PMH:   has a past medical history of Aortic insufficiency, Ascending aorta dilatation (HCC), Dilated aortic root ([place]), Hereditary essential tremor (12/28/2013), Hyperlipidemia, and Hypertension.  PSH:    Past Surgical History:  Procedure Laterality Date   no prior surgery      Current Outpatient Medications  Medication Sig Dispense Refill   aspirin 81 MG tablet Take 81 mg by mouth daily.     Calcium Carbonate (CALCIUM 600 PO) Take 1 tablet by mouth daily.     ibuprofen (ADVIL,MOTRIN) 200 MG tablet Take 200 mg by mouth every 6 (six) hours as needed.     losartan (COZAAR) 25 MG tablet Take 1/2 (one-half) tablet by mouth once daily 45 tablet 0   Multiple Vitamin (MULTIVITAMIN) tablet Take 1 tablet by mouth daily.     pravastatin (PRAVACHOL) 40 MG tablet Take 1 tablet by mouth once daily 90 tablet 3   No current facility-administered medications for this visit.    Allergies:   Patient has no known allergies.   Social History:  The patient  reports that he quit smoking  about 47 years ago. His smoking use included cigarettes. He has never used smokeless tobacco. He reports current alcohol use of about 5.0 standard drinks per week. He reports that he does not use drugs.   Family History:   family history includes Stroke in his father.    Review of Systems: Review of Systems  Constitutional: Negative.   HENT: Negative.    Respiratory: Negative.    Cardiovascular: Negative.   Gastrointestinal: Negative.   Musculoskeletal: Negative.    Neurological: Negative.   Psychiatric/Behavioral: Negative.    All other systems reviewed and are negative.  PHYSICAL EXAM: VS:  BP (!) 150/60 (BP Location: Left Arm, Patient Position: Sitting, Cuff Size: Normal)    Pulse 63    Ht 5' 11.75" (1.822 m)    Wt 217 lb (98.4 kg)    SpO2 97%    BMI 29.64 kg/m  , BMI Body mass index is 29.64 kg/m. Constitutional:  oriented to person, place, and time. No distress.  HENT:  Head: Grossly normal Eyes:  no discharge. No scleral icterus.  Neck: No JVD, no carotid bruits  Cardiovascular: Regular rate and rhythm,2/6 right sternal border diastolic murmur appreciated Pulmonary/Chest: Clear to auscultation bilaterally, no wheezes or rails Abdominal: Soft.  no distension.  no tenderness.  Musculoskeletal: Normal range of motion Neurological:  normal muscle tone. Coordination normal. No atrophy Skin: Skin warm and dry Psychiatric: normal affect, pleasant   Recent Labs: No results found for requested labs within last 8760 hours.    Lipid Panel Lab Results  Component Value Date   CHOL 169 03/01/2020   HDL 40.80 03/01/2020   LDLCALC 103 (H) 03/01/2020   TRIG 130.0 03/01/2020      Wt Readings from Last 3 Encounters:  03/11/21 217 lb (98.4 kg)  03/04/20 211 lb (95.7 kg)  10/18/19 (!) 214 lb (97.1 kg)     ASSESSMENT AND PLAN:  Aortic valve regurgitation  Discussed recent echocardiogram findings, LV mildly dilated measured 6 cm He is aware Ejection fraction estimated 50 to 55% Reports he is not interested in surgery at this time Feels well, asymptomatic, exercising No significant change in echo findings compared to 2021 He feels comfortable in monitoring his aortic valve disease on annual basis  HYPERCHOLESTEROLEMIA  CT scan chest reviewed, no significant calcification in aorta, minimal if any coronary calcification Continue pravastatin  Aneurysm of ascending aorta (HCC) Mildly dilated root and ascending aorta, stable   Total  encounter time more than 25 minutes  Greater than 50% was spent in counseling and coordination of care with the patient    Orders Placed This Encounter  Procedures   EKG 12-Lead     Signed, Esmond Plants, M.D., Ph.D. 03/11/2021  [place], [place] [phone]

## 2021-03-11 ENCOUNTER — Encounter: Payer: Self-pay | Admitting: Cardiovascular Disease

## 2021-03-11 ENCOUNTER — Ambulatory Visit: Payer: Medicare HMO | Admitting: Cardiovascular Disease

## 2021-03-11 VITALS — BP 150/60 | HR 63 | Ht 71.75 in | Wt 217.0 lb

## 2021-03-11 DIAGNOSIS — I77819 Aortic ectasia, unspecified site: Secondary | ICD-10-CM

## 2021-03-11 DIAGNOSIS — I10 Essential (primary) hypertension: Secondary | ICD-10-CM | POA: Diagnosis not present

## 2021-03-11 DIAGNOSIS — I7121 Aneurysm of the ascending aorta, without rupture: Secondary | ICD-10-CM | POA: Diagnosis not present

## 2021-03-11 DIAGNOSIS — I351 Nonrheumatic aortic (valve) insufficiency: Secondary | ICD-10-CM

## 2021-03-11 DIAGNOSIS — E7800 Pure hypercholesterolemia, unspecified: Secondary | ICD-10-CM | POA: Diagnosis not present

## 2021-03-11 MED ORDER — LOSARTAN POTASSIUM 25 MG PO TABS: 25.0000 mg | ORAL_TABLET | Freq: Every day | ORAL | 3 refills | Status: DC

## 2021-03-11 NOTE — Patient Instructions (Addendum)
Medication Instructions:  Your physician has recommended you make the following change in your medication:   INCREASE losartan to 25 mg daily   If you need a refill on your cardiac medications before your next appointment, please call your pharmacy.    Lab work: No new labs needed   If you have labs (blood work) drawn today and your tests are completely normal, you will receive your results only by: [place] (if you have MyChart) OR A paper copy in the mail If you have any lab test that is abnormal or we need to change your treatment, we will call you to review the results.   Testing/Procedures: Echocardiogram - Your physician has requested that you have an echocardiogram in 1 year. Echocardiography is a painless test that uses sound waves to create images of your heart. It provides your doctor with information about the size and shape of your heart and how well your hearts chambers and valves are working. This procedure takes approximately one hour. There are no restrictions for this procedure.   Follow-Up: At [facility], you and your health needs are our priority.  As part of our continuing mission to provide you with exceptional heart care, we have created designated Provider Care Teams.  These Care Teams include your primary Cardiologist (physician) and Advanced Practice Providers (APPs -  Physician Assistants and Nurse Practitioners) who all work together to provide you with the care you need, when you need it.  Your physician wants you to follow-up in: 1 year. You will receive a reminder letter in the mail two months in advance. If you don't receive a letter, please call our office to schedule the follow-up appointment.   Providers on your designated Care Team:   Murray Hodgkins, NP Christell Faith, PA-C Cadence Kathlen Mody, [place]  Any Other Special Instructions Will Be Listed Below (If Applicable).  Tips to Measure your Blood Pressure Correctly   Your provider would like  for your blood pressure to be less than 130/85. Please check it at home on a regular basis.   To determine whether you have hypertension, a medical professional will take a blood pressure reading. How you prepare for the test, the position of your arm, and other factors can change a blood pressure reading by 10% or more. That could be enough to hide high blood pressure, start you on a drug you don't really need, or lead your doctor to incorrectly adjust your medications.  National and international guidelines offer specific instructions for measuring blood pressure. If a doctor, nurse, or medical assistant isn't doing it right, don't hesitate to ask him or her to get with the guidelines.  Here's what you can do to ensure a correct reading:  Don't drink a caffeinated beverage or smoke during the 30 minutes before the test.  Sit quietly for five minutes before the test begins.  During the measurement, sit in a chair with your feet on the floor and your arm supported so your elbow is at about heart level.  The inflatable part of the cuff should completely cover at least 80% of your upper arm, and the cuff should be placed on bare skin, not over a shirt.  Don't talk during the measurement.  Have your blood pressure measured twice, with a brief break in between. If the readings are different by 5 points or more, have it done a third time.  There are times to break these rules. If you sometimes feel lightheaded when getting out of  bed in the morning or when you stand after sitting, you should have your blood pressure checked while seated and then while standing to see if it falls from one position to the next.  Because blood pressure varies throughout the day, your doctor will rarely diagnose hypertension on the basis of a single reading. Instead, he or she will want to confirm the measurements on at least two occasions, usually within a few weeks of one another. The exception to this rule is if you have a  blood pressure reading of 180/110 mm Hg or higher. A result this high usually calls for prompt treatment.  It's a good idea to have your blood pressure measured in both arms at least once, since the reading in one arm (usually the right) may be higher than that in the left. A 2014 study in The American Journal of Medicine of nearly 3,400 people found average arm- to-arm differences in systolic blood pressure of about 5 points. The higher number should be used to make treatment decisions.  In 2017, new guidelines from the [place], the [employer] of Cardiology, and nine other health organizations lowered the diagnosis of high blood pressure to 130/80 mm Hg or higher for all adults. The guidelines also redefined the various blood pressure categories to now include normal, elevated, Stage 1 hypertension, Stage 2 hypertension, and hypertensive crisis (see "Blood pressure categories").  Blood pressure categories  Blood pressure category SYSTOLIC (upper number)  DIASTOLIC (lower number)  Normal Less than 120 mm Hg and Less than 80 mm Hg  Elevated 120-129 mm Hg and Less than 80 mm Hg  High blood pressure: Stage 1 hypertension 130-139 mm Hg or 80-89 mm Hg  High blood pressure: Stage 2 hypertension 140 mm Hg or higher or 90 mm Hg or higher  Hypertensive crisis (consult your doctor immediately) Higher than 180 mm Hg and/or Higher than 120 mm Hg  Source: American Heart Association and American Stroke Association. For more on getting your blood pressure under control, buy Controlling Your Blood Pressure, a Special Health Report from [facility].   Blood Pressure Log   Date   Time  Blood Pressure  Position  Example: Nov 1 9 AM 124/78 sitting                                                       COVID-19 Vaccine Information can be found at: ShippingScam.co.uk For questions related to  vaccine distribution or appointments, please email vaccine@[email or web address] .[email or web address] or call [phone].

## 2021-03-18 ENCOUNTER — Telehealth: Payer: Self-pay | Admitting: Family Medicine

## 2021-03-20 ENCOUNTER — Encounter: Payer: Medicare HMO | Admitting: Family Medicine

## 2021-03-26 ENCOUNTER — Encounter: Payer: Self-pay | Admitting: Family Medicine

## 2021-03-26 ENCOUNTER — Other Ambulatory Visit: Payer: Self-pay

## 2021-03-26 ENCOUNTER — Ambulatory Visit (INDEPENDENT_AMBULATORY_CARE_PROVIDER_SITE_OTHER): Payer: Medicare HMO | Admitting: Family Medicine

## 2021-03-26 VITALS — BP 144/60 | HR 66 | Temp 98.2°F | Ht 71.75 in | Wt 215.2 lb

## 2021-03-26 DIAGNOSIS — E7800 Pure hypercholesterolemia, unspecified: Secondary | ICD-10-CM

## 2021-03-26 DIAGNOSIS — C4441 Basal cell carcinoma of skin of scalp and neck: Secondary | ICD-10-CM

## 2021-03-26 DIAGNOSIS — Z125 Encounter for screening for malignant neoplasm of prostate: Secondary | ICD-10-CM | POA: Diagnosis not present

## 2021-03-26 DIAGNOSIS — Z79899 Other long term (current) drug therapy: Secondary | ICD-10-CM

## 2021-03-26 DIAGNOSIS — Z0001 Encounter for general adult medical examination with abnormal findings: Secondary | ICD-10-CM

## 2021-03-26 LAB — LIPID PANEL
Cholesterol: 163 mg/dL (ref 0–200)
HDL: 39.6 mg/dL (ref >39.00)
LDL Cholesterol: 96 mg/dL (ref 0–99)
NonHDL: 122.93
Total CHOL/HDL Ratio: 4
Triglycerides: 133 mg/dL (ref 0.0–149.0)
VLDL: 26.6 mg/dL (ref 0.0–40.0)

## 2021-03-26 LAB — HEPATIC FUNCTION PANEL
ALT: 15 U/L (ref 0–53)
AST: 18 U/L (ref 0–37)
Albumin: 4.3 g/dL (ref 3.5–5.2)
Alkaline Phosphatase: 56 U/L (ref 39–117)
Bilirubin, Direct: 0.1 mg/dL (ref 0.0–0.3)
Total Bilirubin: 0.8 mg/dL (ref 0.2–1.2)
Total Protein: 7.6 g/dL (ref 6.0–8.3)

## 2021-03-26 LAB — CBC WITH DIFFERENTIAL/PLATELET
Basophils Absolute: 0 10*3/uL (ref 0.0–0.1)
Basophils Relative: 0.7 % (ref 0.0–3.0)
Eosinophils Absolute: 0.2 10*3/uL (ref 0.0–0.7)
Eosinophils Relative: 3.4 % (ref 0.0–5.0)
HCT: 41.5 % (ref 39.0–52.0)
Hemoglobin: 13.8 g/dL (ref 13.0–17.0)
Lymphocytes Relative: 17.2 % (ref 12.0–46.0)
Lymphs Abs: 1.1 10*3/uL (ref 0.7–4.0)
MCHC: 33.2 g/dL (ref 30.0–36.0)
MCV: 96.7 fl (ref 78.0–100.0)
Monocytes Absolute: 0.6 10*3/uL (ref 0.1–1.0)
Monocytes Relative: 9 % (ref 3.0–12.0)
Neutro Abs: 4.3 10*3/uL (ref 1.4–7.7)
Neutrophils Relative %: 69.7 % (ref 43.0–77.0)
Platelets: 229 10*3/uL (ref 150.0–400.0)
RBC: 4.29 Mil/uL (ref 4.22–5.81)
RDW: 12.9 % (ref 11.5–15.5)
WBC: 6.1 10*3/uL (ref 4.0–10.5)

## 2021-03-26 LAB — BASIC METABOLIC PANEL
BUN: 15 mg/dL (ref 6–23)
CO2: 32 mEq/L (ref 19–32)
Calcium: 9.8 mg/dL (ref 8.4–10.5)
Chloride: 102 mEq/L (ref 96–112)
Creatinine, Ser: 1 mg/dL (ref 0.40–1.50)
GFR: 73.67 mL/min (ref >60.00)
Glucose, Bld: 89 mg/dL (ref 70–99)
Potassium: 4.5 mEq/L (ref 3.5–5.1)
Sodium: 140 mEq/L (ref 135–145)

## 2021-03-26 LAB — PSA, MEDICARE: PSA: 1.14 ng/ml (ref 0.10–4.00)

## 2021-03-26 NOTE — Progress Notes (Signed)
Anthoney Sheppard T. Jolynda Townley, MD, [place] at [facility] [place], [address]  Phone: [phone]   FAX: [place] - 76 y.o. male   MRN [id number]   Date of Birth: 09-Nov-1945  Date: 03/26/2021   PCP: Owens Loffler, MD   Referral: Owens Loffler, MD  Chief Complaint  Patient presents with   Medicare Wellness    This visit occurred during the SARS-CoV-2 public health emergency.  Safety protocols were in place, including screening questions prior to the visit, additional usage of staff PPE, and extensive cleaning of exam room while observing appropriate contact time as indicated for disinfecting solutions.   Patient Care Team: Owens Loffler, MD as PCP - General (Family Medicine) Subjective:   Corey Williams is a 76 y.o. pleasant patient who presents for CPX examination:  Preventative Health Maintenance Visit:  Health Maintenance Summary Reviewed and updated, unless pt declines services.  Tobacco History Reviewed. Alcohol: No concerns, no excessive use - 7-10 beers in a week. Exercise Habits: Some activity, rec at least 30 mins 5 times a week - 5 days a week. STD concerns: no risk or activity to increase risk Drug Use: None  Shingrix - does not want it.  Covid? Twice  16 year-old daughter in law Blood clot from the brain  Health Maintenance  Topic Date Due   Zoster Vaccines- Shingrix (1 of 2) Never done   COVID-19 Vaccine (4 - Booster for Pfizer series) 02/17/2020   TETANUS/TDAP  01/27/2021   COLONOSCOPY (Pts 45-16yrs Insurance coverage will need to be confirmed)  02/14/2022   Pneumonia Vaccine 20+ Years old  Completed   INFLUENZA VACCINE  Completed   Hepatitis C Screening  Completed   HPV VACCINES  Aged Out    Immunization History  Administered Date(s) Administered   Influenza, High Dose Seasonal PF 01/08/2021   Influenza,inj,Quad PF,6+ Mos 01/17/2013, 12/27/2013, 01/07/2015, 01/08/2016    Influenza-Unspecified 03/01/2020   PFIZER(Purple Top)SARS-COV-2 Vaccination 05/05/2019, 05/30/2019, 12/23/2019   Pneumococcal Conjugate-13 12/27/2013   Pneumococcal Polysaccharide-23 12/22/2012   Tdap 01/28/2011    Patient Active Problem List   Diagnosis Date Noted   Hereditary essential tremor 12/28/2013   Aortic valve regurgitation 10/13/2013   Aneurysm of ascending aorta 10/13/2013   HYPERCHOLESTEROLEMIA 06/05/2010   ACTINIC KERATOSIS, HEAD 06/05/2010    Past Medical History:  Diagnosis Date   Aortic insufficiency    Ascending aorta dilatation (HCC)    Dilated aortic root ([place])    Hereditary essential tremor 12/28/2013   Hyperlipidemia    Hypertension     Past Surgical History:  Procedure Laterality Date   no prior surgery      Family History  Problem Relation Age of Onset   Stroke Father    Colon cancer Neg Hx    Stomach cancer Neg Hx     Past Medical History, Surgical History, Social History, Family History, Problem List, Medications, and Allergies have been reviewed and updated if relevant.  Review of Systems: Pertinent positives are listed above.  Otherwise, a full 14 point review of systems has been done in full and it is negative except where it is noted positive.  Objective:   BP (!) 144/60    Pulse 66    Temp 98.2 F (36.8 C) (Temporal)    Ht 5' 11.75" (1.822 m)    Wt 215 lb 4 oz (97.6 kg)    SpO2 97%    BMI 29.40 kg/m  Fall Risk 01/11/2017 02/23/2018 02/27/2019 03/04/2020 03/26/2021  Falls in the past year? No 0 0 1 0  Was there an injury with Fall? - - - 1 -  Fall Risk Category Calculator - - - 2 -  Fall Risk Category - - - Moderate -  Patient Fall Risk Level - - - Low fall risk -  Patient at Risk for Falls Due to - - - No Fall Risks -  Fall risk Follow up - - - Falls evaluation completed -   Ideal Body Weight: Weight in (lb) to have BMI = 25: 182.7 Hearing Screening  Method: Audiometry   500Hz  1000Hz  2000Hz  4000Hz   Right ear 20 20 20  0  Left  ear 0 20 20 0   Vision Screening   Right eye Left eye Both eyes  Without correction     With correction 20/25 20/25 20/25    Depression screen [facility] 2/9 03/26/2021 03/04/2020 02/27/2019 02/23/2018 01/11/2017  Decreased Interest 0 0 0 0 0  Down, Depressed, Hopeless 0 0 0 0 0  PHQ - 2 Score 0 0 0 0 0     GEN: well developed, well nourished, no acute distress Eyes: conjunctiva and lids normal, PERRLA, EOMI ENT: TM clear, nares clear, oral exam WNL Neck: supple, no lymphadenopathy, no thyromegaly, no JVD Pulm: clear to auscultation and percussion, respiratory effort normal CV: regular rate and rhythm, S1-S2, no murmur, rub or gallop, no bruits, peripheral pulses normal and symmetric, no cyanosis, clubbing, edema or varicosities GI: soft, non-tender; no hepatosplenomegaly, masses; active bowel sounds all quadrants GU: deferred Lymph: no cervical, axillary or inguinal adenopathy MSK: gait normal, muscle tone and strength WNL, no joint swelling, effusions, discoloration, crepitus  SKIN:      Neuro: normal mental status, normal strength, sensation, and motion Psych: alert; oriented to person, place and time, normally interactive and not anxious or depressed in appearance.  All labs reviewed with patient.   Assessment and Plan:     ICD-10-CM   1. Encounter for health maintenance examination with abnormal findings  Z00.01     2. Special screening for malignant neoplasm of prostate  Z12.5 PSA, Medicare    3. Encounter for long-term (current) use of medications  Z79.899 CBC with Differential/Platelet    Basic metabolic panel    Hepatic function panel    4. Pure hypercholesterolemia  E78.00 Lipid panel    5. Basal cell carcinoma (BCC) of right side of neck  C44.41 Ambulatory referral to Dermatology     Globally he is doing really well. All labs came back normal.  Behind his ear, it looks like he has BCC vs SCC  He will hold off on additional vaccines for now.  Health Maintenance  Exam: The patient's preventative maintenance and recommended screening tests for an annual wellness exam were reviewed in full today. Brought up to date unless services declined.  Counselled on the importance of diet, exercise, and its role in overall health and mortality. The patient's FH and SH was reviewed, including their home life, tobacco status, and drug and alcohol status.  Follow-up in 1 year for physical exam or additional follow-up below.  I have personally reviewed the Medicare Annual Wellness questionnaire and have noted 1. The patient's medical and social history 2. Their use of alcohol, tobacco or illicit drugs 3. Their current medications and supplements 4. The patient's functional ability including ADL's, fall risks, home safety risks and hearing or visual  impairment. 5. Diet and physical activities 6. Evidence for depression or mood disorders 7. Reviewed Updated provider list, see scanned forms and [facility] Snapshot.  8. Reviewed whether or not the patient has HCPOA or living will, and discussed what this means with the patient.  Recommended he bring in a copy for his chart in [facility].  The patients weight, height, BMI and visual acuity have been recorded in the chart I have made referrals, counseling and provided education to the patient based review of the above and I have provided the pt with a written personalized care plan for preventive services.  I have provided the patient with a copy of your personalized plan for preventive services. Instructed to take the time to review along with their updated medication list.  Follow-up: No follow-ups on file. Or follow-up in 1 year if not noted.  Future Appointments  Date Time Provider [place]  04/21/2021  1:45 PM Ralene Bathe, MD [facility] None    No orders of the defined types were placed in this encounter.  There are no discontinued medications. Orders Placed This Encounter  Procedures   Ambulatory  referral to Dermatology    Signed,  Frederico Hamman T. Kemani Heidel, MD   Allergies as of 03/26/2021   No Known Allergies      Medication List        Accurate as of March 26, 2021 11:59 PM. If you have any questions, ask your nurse or doctor.          aspirin 81 MG tablet Take 81 mg by mouth daily.   CALCIUM 600 PO Take 1 tablet by mouth daily.   ibuprofen 200 MG tablet Commonly known as: ADVIL Take 200 mg by mouth every 6 (six) hours as needed.   losartan 25 MG tablet Commonly known as: COZAAR Take 1 tablet (25 mg total) by mouth daily.   multivitamin tablet Take 1 tablet by mouth daily.   pravastatin 40 MG tablet Commonly known as: PRAVACHOL Take 1 tablet by mouth once daily

## 2021-04-21 ENCOUNTER — Other Ambulatory Visit: Payer: Self-pay

## 2021-04-21 ENCOUNTER — Ambulatory Visit: Payer: Medicare HMO | Admitting: Dermatology

## 2021-04-21 DIAGNOSIS — L821 Other seborrheic keratosis: Secondary | ICD-10-CM | POA: Diagnosis not present

## 2021-04-21 DIAGNOSIS — C4491 Basal cell carcinoma of skin, unspecified: Secondary | ICD-10-CM

## 2021-04-21 DIAGNOSIS — L578 Other skin changes due to chronic exposure to nonionizing radiation: Secondary | ICD-10-CM

## 2021-04-21 DIAGNOSIS — C4441 Basal cell carcinoma of skin of scalp and neck: Secondary | ICD-10-CM | POA: Diagnosis not present

## 2021-04-21 DIAGNOSIS — D492 Neoplasm of unspecified behavior of bone, soft tissue, and skin: Secondary | ICD-10-CM

## 2021-04-21 HISTORY — DX: Basal cell carcinoma of skin, unspecified: C44.91

## 2021-04-21 NOTE — Progress Notes (Signed)
New Patient Visit  Subjective  Corey Williams is a 76 y.o. male who presents for the following: Skin Problem (Pt here to have a spot on his right neck checked today, area appeared about 3 weeks ago and not going away). The patient has spots, moles and lesions to be evaluated, some may be new or changing and the patient has concerns that these could be cancer.  The following portions of the chart were reviewed this encounter and updated as appropriate:   Tobacco   Allergies   Meds   Problems   Med Hx   Surg Hx   Fam Hx      Review of Systems:  No other skin or systemic complaints except as noted in HPI or Assessment and Plan.  Objective  Well appearing patient in no apparent distress; mood and affect are within normal limits.  A focused examination was performed including neck . Relevant physical exam findings are noted in the Assessment and Plan.  right lateral neck below the ear 1.1 cm crusted papule        Assessment & Plan  Neoplasm of skin right lateral neck below the ear  Epidermal / dermal shaving  Lesion diameter (cm):  1.1 Informed consent: discussed and consent obtained   Timeout: patient name, date of birth, surgical site, and procedure verified   Patient was prepped and draped in usual sterile fashion: area prepped with alcohol. Anesthesia: the lesion was anesthetized in a standard fashion   Anesthetic:  1% lidocaine w/ epinephrine 1-100,000 local infiltration Instrument used: flexible razor blade   Hemostasis achieved with: pressure, aluminum chloride and electrodesiccation   Outcome: patient tolerated procedure well   Post-procedure details: wound care instructions given   Post-procedure details comment:  Ointment and a small bandage applied  Destruction of lesion  Destruction method: electrodesiccation and curettage   Informed consent: discussed and consent obtained   Timeout:  patient name, date of birth, surgical site, and procedure  verified Anesthesia: the lesion was anesthetized in a standard fashion   Anesthetic:  1% lidocaine w/ epinephrine 1-100,000 buffered w/ 8.4% NaHCO3 Curettage performed in three different directions: Yes   Electrodesiccation performed over the curetted area: Yes   Curettage cycles:  3 Lesion length (cm):  1.1 Lesion width (cm):  1.1 Margin per side (cm):  0.2 Final wound size (cm):  1.5 Hemostasis achieved with:  electrodesiccation Outcome: patient tolerated procedure well with no complications   Post-procedure details: sterile dressing applied and wound care instructions given   Dressing type: petrolatum    Specimen 1 - Surgical pathology Differential Diagnosis: R/O BCC   Check Margins: No  Basal cell carcinoma (BCC) of right side of neck  Seborrheic Keratoses - Stuck-on, waxy, tan-brown papules and/or plaques  - Benign-appearing - Discussed benign etiology and prognosis. - Observe - Call for any changes  Actinic Damage - chronic, secondary to cumulative UV radiation exposure/sun exposure over time - diffuse scaly erythematous macules with underlying dyspigmentation - Recommend daily broad spectrum sunscreen SPF 30+ to sun-exposed areas, reapply every 2 hours as needed.  - Recommend staying in the shade or wearing long sleeves, sun glasses (UVA+UVB protection) and wide brim hats (4-inch brim around the entire circumference of the hat). - Call for new or changing lesions.   Return for total-body skin exam sometime in the next few months.  Marta Lamas, CMA, am acting as scribe for Sarina Ser, MD .  Documentation: I have reviewed the above documentation  for accuracy and completeness, and I agree with the above.  Sarina Ser, MD

## 2021-04-21 NOTE — Patient Instructions (Addendum)

## 2021-04-22 ENCOUNTER — Encounter: Payer: Self-pay | Admitting: Dermatology

## 2021-04-22 ENCOUNTER — Telehealth: Payer: Self-pay

## 2021-04-22 NOTE — Telephone Encounter (Signed)
-----   Message from Ralene Bathe, MD sent at 04/22/2021  3:06 PM EST ----- Diagnosis Skin , right lateral neck below the ear BASAL CELL CARCINOMA, NODULAR PATTERN, BASE INVOLVED  Cancer - BCC Already treated Recheck next visit

## 2021-04-22 NOTE — Telephone Encounter (Signed)
Advised pt of bx results/sh ?

## 2021-05-22 ENCOUNTER — Other Ambulatory Visit: Payer: Self-pay | Admitting: Family Medicine

## 2021-10-03 NOTE — Telephone Encounter (Signed)
error 

## 2021-11-03 ENCOUNTER — Telehealth: Payer: Self-pay | Admitting: Cardiology

## 2021-11-03 NOTE — Telephone Encounter (Signed)
Did not need this encounter °

## 2021-12-09 ENCOUNTER — Other Ambulatory Visit: Payer: Medicare HMO

## 2021-12-16 ENCOUNTER — Ambulatory Visit: Payer: Medicare HMO | Admitting: Cardiovascular Disease

## 2022-01-20 ENCOUNTER — Ambulatory Visit (INDEPENDENT_AMBULATORY_CARE_PROVIDER_SITE_OTHER): Payer: Medicare HMO

## 2022-01-20 ENCOUNTER — Encounter: Payer: Self-pay | Admitting: Gastroenterology

## 2022-01-20 VITALS — Ht 71.75 in | Wt 215.0 lb

## 2022-01-20 DIAGNOSIS — Z0000 Encounter for general adult medical examination without abnormal findings: Secondary | ICD-10-CM | POA: Diagnosis not present

## 2022-01-20 NOTE — Progress Notes (Signed)
Subjective:   JOHNMATTHEW SOLORIO is a 76 y.o. male who presents for Medicare Annual/Subsequent preventive examination.  Review of Systems    No ROS.  Medicare Wellness Virtual Visit.  Visual/audio telehealth visit, UTA vital signs.   See social history for additional risk factors.   Cardiac Risk Factors include: advanced age (>37mn, >>40women);male gender     Objective:    Today's Vitals   01/20/22 1002  Weight: 215 lb (97.5 kg)  Height: 5' 11.75" (1.822 m)   Body mass index is 29.36 kg/m.     01/20/2022    9:51 AM  Advanced Directives  Does Patient Have a Medical Advance Directive? Yes  Type of [place]  Does patient want to make changes to medical advance directive? No - Patient declined  Copy of [place] Chart? Yes - validated most recent copy scanned in chart (See row information)    Current Medications (verified) Outpatient Encounter Medications as of 01/20/2022  Medication Sig   aspirin 81 MG tablet Take 81 mg by mouth daily.   Calcium Carbonate (CALCIUM 600 PO) Take 1 tablet by mouth daily.   ibuprofen (ADVIL,MOTRIN) 200 MG tablet Take 200 mg by mouth every 6 (six) hours as needed.   losartan (COZAAR) 25 MG tablet Take 1 tablet (25 mg total) by mouth daily.   Multiple Vitamin (MULTIVITAMIN) tablet Take 1 tablet by mouth daily.   pravastatin (PRAVACHOL) 40 MG tablet Take 1 tablet by mouth once daily   No facility-administered encounter medications on file as of 01/20/2022.    Allergies (verified) Patient has no known allergies.   History: Past Medical History:  Diagnosis Date   Aortic insufficiency    Ascending aorta dilatation (HCC)    Basal cell carcinoma 04/21/2021   right lateral neck below the ear, EDC   Dilated aortic root (HErhard    Hereditary essential tremor 12/28/2013   Hyperlipidemia    Hypertension    Past Surgical History:  Procedure Laterality Date   no prior surgery      Family History  Problem Relation Age of Onset   Stroke Father    Colon cancer Neg Hx    Stomach cancer Neg Hx    Social History   Socioeconomic History   Marital status: Married    Spouse name: Not on file   Number of children: Not on file   Years of education: Not on file   Highest education level: Not on file  Occupational History   Occupation: sScientist, clinical (histocompatibility and immunogenetics) RETIRED  Tobacco Use   Smoking status: Former    Types: Cigarettes    Quit date: 02/01/1974    Years since quitting: 48.0   Smokeless tobacco: Never  Vaping Use   Vaping Use: Never used  Substance and Sexual Activity   Alcohol use: Yes    Alcohol/week: 5.0 standard drinks of alcohol    Types: 5 Cans of beer per week    Comment: occ   Drug use: No   Sexual activity: Not on file  Other Topics Concern   Not on file  Social History Narrative   Regular exercise--yes   Social Determinants of Health   Financial Resource Strain: Low Risk  (01/20/2022)   Overall Financial Resource Strain (CARDIA)    Difficulty of Paying Living Expenses: Not hard at all  Food Insecurity: No Food Insecurity (01/20/2022)   Hunger Vital Sign    Worried About Running Out  of Food in the Last Year: Never true    [place] in the Last Year: Never true  Transportation Needs: No Transportation Needs (01/20/2022)   PRAPARE - Hydrologist (Medical): No    Lack of Transportation (Non-Medical): No  Physical Activity: Not on file  Stress: No Stress Concern Present (01/20/2022)   [place]    Feeling of Stress : Not at all  Social Connections: Unknown (01/20/2022)   Social Connection and Isolation Panel [NHANES]    Frequency of Communication with Friends and Family: Not on file    Frequency of Social Gatherings with Friends and Family: Not on file    Attends Religious Services: Not on file    Active Member of Clubs or Organizations:  Not on file    Attends Archivist Meetings: Not on file    Marital Status: Married    Tobacco Counseling Counseling given: Not Answered   Clinical Intake:  Pre-visit preparation completed: Yes        Diabetes: No  How often do you need to have someone help you when you read instructions, pamphlets, or other written materials from your doctor or pharmacy?: 1 - Never  Interpreter Needed?: No      Activities of Daily Living    01/20/2022    9:52 AM  In your present state of health, do you have any difficulty performing the following activities:  Hearing? 0  Vision? 0  Difficulty concentrating or making decisions? 0  Walking or climbing stairs? 0  Dressing or bathing? 0  Doing errands, shopping? 0  Preparing Food and eating ? N  Using the Toilet? N  In the past six months, have you accidently leaked urine? N  Do you have problems with loss of bowel control? N  Managing your Medications? N  Managing your Finances? N  Housekeeping or managing your Housekeeping? N    Patient Care Team: Owens Loffler, MD as PCP - General (Family Medicine)  Indicate any recent Medical Services you may have received from other than [facility] providers in the past year (date may be approximate).     Assessment:   This is a routine wellness examination for McLeod.  I connected with  Georgiana Spinner on 01/20/22 by a audio enabled telemedicine application and verified that I am speaking with the correct person using two identifiers.  Patient Location: Home  Provider Location: Office/Clinic  I discussed the limitations of evaluation and management by telemedicine. The patient expressed understanding and agreed to proceed.   Hearing/Vision screen Hearing Screening - Comments:: Patient is able to hear conversational tones without difficulty.  No issues reported.   Vision Screening - Comments:: Wears corrective lenses They have seen their ophthalmologist in the last 12 months.     Dietary issues and exercise activities discussed: Current Exercise Habits: Home exercise routine, Type of exercise: walking, Time (Minutes): 45, Frequency (Times/Week): 3, Weekly Exercise (Minutes/Week): 135, Intensity: Mild Regular diet   Goals Addressed             This Visit's Progress    Maintain Healthy Lifestyle       Stay active Healthy diet Stay hydrated       Depression Screen    01/20/2022    9:52 AM 03/26/2021   11:14 AM 03/04/2020    8:40 AM 02/27/2019    8:30 AM 02/23/2018    8:07 AM 01/11/2017  8:30 AM 01/08/2016    8:42 AM  PHQ 2/9 Scores  PHQ - 2 Score 0 0 0 0 0 0 0    Fall Risk    01/20/2022    9:52 AM 03/26/2021   11:14 AM 03/04/2020    8:41 AM 02/27/2019    8:29 AM 02/23/2018    8:07 AM  Fall Risk   Falls in the past year? 0 0 1 0 0  Number falls in past yr: 0  0    Injury with Fall? 0  1    Risk for fall due to : No Fall Risks  No Fall Risks    Follow up Falls evaluation completed  Falls evaluation completed      [place]: Home free of loose throw rugs in walkways, pet beds, electrical cords, etc? Yes  Adequate lighting in your home to reduce risk of falls? Yes   ASSISTIVE DEVICES UTILIZED TO PREVENT FALLS: Life alert? No  Use of a cane, walker or w/c? No   TIMED UP AND GO: Was the test performed? No .   Cognitive Function:        01/20/2022    9:54 AM  6CIT Screen  What Year? 0 points  What month? 0 points  What time? 0 points  Count back from 20 0 points  Months in reverse 0 points  Repeat phrase 0 points  Total Score 0 points    Immunizations Immunization History  Administered Date(s) Administered   Influenza, High Dose Seasonal PF 01/08/2021   Influenza,inj,Quad PF,6+ Mos 01/17/2013, 12/27/2013, 01/07/2015, 01/08/2016   Influenza-Unspecified 03/01/2020   PFIZER(Purple Top)SARS-COV-2 Vaccination 05/05/2019, 05/30/2019, 12/23/2019   Pneumococcal Conjugate-13 12/27/2013    Pneumococcal Polysaccharide-23 12/22/2012   Tdap 01/28/2011   TDAP status: Due, Education has been provided regarding the importance of this vaccine. Advised may receive this vaccine at local pharmacy or Health Dept. Aware to provide a copy of the vaccination record if obtained from local pharmacy or Health Dept. Verbalized acceptance and understanding.  Flu Vaccine status: Due, Education has been provided regarding the importance of this vaccine. Advised may receive this vaccine at local pharmacy or Health Dept. Aware to provide a copy of the vaccination record if obtained from local pharmacy or Health Dept. Verbalized acceptance and understanding.  Covid-19 vaccine status: Completed vaccines x3   Shingles vaccine- declined per patient.   Screening Tests Health Maintenance  Topic Date Due   COVID-19 Vaccine (4 - Pfizer risk series) 02/05/2022 (Originally 02/17/2020)   INFLUENZA VACCINE  06/21/2022 (Originally 10/21/2021)   Zoster Vaccines- Shingrix (1 of 2) 07/22/2022 (Originally 10/22/1964)   TETANUS/TDAP  01/21/2023 (Originally 01/27/2021)   Medicare Annual Wellness (AWV)  01/21/2023   Pneumonia Vaccine 45+ Years old  Completed   Hepatitis C Screening  Completed   HPV VACCINES  Aged Out   COLONOSCOPY (Pts 45-52yr Insurance coverage will need to be confirmed)  Discontinued    Health Maintenance  There are no preventive care reminders to display for this patient.  Lung Cancer Screening: (Low Dose CT Chest recommended if Age 76-80years, 30 pack-year currently smoking OR have quit w/in 15years.) does not qualify.   Hepatitis C Screening: Completed 2017.  Vision Screening: Recommended annual ophthalmology exams for early detection of glaucoma and other disorders of the eye.  Dental Screening: Recommended annual dental exams for proper oral hygiene.  Community Resource Referral / Chronic Care Management: CRR required this visit?  No   CCM  required this visit?  No      Plan:      I have personally reviewed and noted the following in the patient's chart:   Medical and social history Use of alcohol, tobacco or illicit drugs  Current medications and supplements including opioid prescriptions. Patient is not currently taking opioid prescriptions. Functional ability and status Nutritional status Physical activity Advanced directives List of other physicians Hospitalizations, surgeries, and ER visits in previous 12 months Vitals Screenings to include cognitive, depression, and falls Referrals and appointments  In addition, I have reviewed and discussed with patient certain preventive protocols, quality metrics, and best practice recommendations. A written personalized care plan for preventive services as well as general preventive health recommendations were provided to patient.     Leta Jungling, LPN   [id number]

## 2022-01-20 NOTE — Patient Instructions (Addendum)
[name] , Thank you for taking time to come for your Medicare Wellness Visit. I appreciate your ongoing commitment to your health goals. Please review the following plan we discussed and let me know if I can assist you in the future.   These are the goals we discussed:  Goals      Maintain Healthy Lifestyle     Stay active Healthy diet Stay hydrated        This is a list of the screening recommended for you and due dates:  Health Maintenance  Topic Date Due   COVID-19 Vaccine (4 - Pfizer risk series) 02/05/2022*   Flu Shot  06/21/2022*   Zoster (Shingles) Vaccine (1 of 2) 07/22/2022*   Tetanus Vaccine  01/21/2023*   Medicare Annual Wellness Visit  01/21/2023   Pneumonia Vaccine  Completed   Hepatitis C Screening: USPSTF Recommendation to screen - Ages 18-79 yo.  Completed   HPV Vaccine  Aged Out   Colon Cancer Screening  Discontinued  *Topic was postponed. The date shown is not the original due date.    Advanced directives: on file  Conditions/risks identified: none new  Next appointment: Follow up in one year for your annual wellness visit.   Preventive Care 76 Years and Older, Male  Preventive care refers to lifestyle choices and visits with your health care provider that can promote health and wellness. What does preventive care include? A yearly physical exam. This is also called an annual well check. Dental exams once or twice a year. Routine eye exams. Ask your health care provider how often you should have your eyes checked. Personal lifestyle choices, including: Daily care of your teeth and gums. Regular physical activity. Eating a healthy diet. Avoiding tobacco and drug use. Limiting alcohol use. Practicing safe sex. Taking low doses of aspirin every day. Taking vitamin and mineral supplements as recommended by your health care provider. What happens during an annual well check? The services and screenings done by your health care provider during your  annual well check will depend on your age, overall health, lifestyle risk factors, and family history of disease. Counseling  Your health care provider may ask you questions about your: Alcohol use. Tobacco use. Drug use. Emotional well-being. Home and relationship well-being. Sexual activity. Eating habits. History of falls. Memory and ability to understand (cognition). Work and work Statistician. Screening  You may have the following tests or measurements: Height, weight, and BMI. Blood pressure. Lipid and cholesterol levels. These may be checked every 5 years, or more frequently if you are over 76 years old. Skin check. Lung cancer screening. You may have this screening every year starting at age 76 if you have a 30-pack-year history of smoking and currently smoke or have quit within the past 15 years. Fecal occult blood test (FOBT) of the stool. You may have this test every year starting at age 76. Flexible sigmoidoscopy or colonoscopy. You may have a sigmoidoscopy every 5 years or a colonoscopy every 10 years starting at age 76. Prostate cancer screening. Recommendations will vary depending on your family history and other risks. Hepatitis C blood test. Hepatitis B blood test. Sexually transmitted disease (STD) testing. Diabetes screening. This is done by checking your blood sugar (glucose) after you have not eaten for a while (fasting). You may have this done every 1-3 years. Abdominal aortic aneurysm (AAA) screening. You may need this if you are a current or former smoker. Osteoporosis. You may be screened starting at age 76  if you are at high risk. Talk with your health care provider about your test results, treatment options, and if necessary, the need for more tests. Vaccines  Your health care provider may recommend certain vaccines, such as: Influenza vaccine. This is recommended every year. Tetanus, diphtheria, and acellular pertussis (Tdap, Td) vaccine. You may need a Td  booster every 10 years. Zoster vaccine. You may need this after age 35. Pneumococcal 13-valent conjugate (PCV13) vaccine. One dose is recommended after age 76. Pneumococcal polysaccharide (PPSV23) vaccine. One dose is recommended after age 76. Talk to your health care provider about which screenings and vaccines you need and how often you need them. This information is not intended to replace advice given to you by your health care provider. Make sure you discuss any questions you have with your health care provider. Document Released: 04/05/2015 Document Revised: 11/27/2015 Document Reviewed: 01/08/2015 Elsevier Interactive Patient Education  2017 [place] Prevention in the Home Falls can cause injuries. They can happen to people of all ages. There are many things you can do to make your home safe and to help prevent falls. What can I do on the outside of my home? Regularly fix the edges of walkways and driveways and fix any cracks. Remove anything that might make you trip as you walk through a door, such as a raised step or threshold. Trim any bushes or trees on the path to your home. Use bright outdoor lighting. Clear any walking paths of anything that might make someone trip, such as rocks or tools. Regularly check to see if handrails are loose or broken. Make sure that both sides of any steps have handrails. Any raised decks and porches should have guardrails on the edges. Have any leaves, snow, or ice cleared regularly. Use sand or salt on walking paths during winter. Clean up any spills in your garage right away. This includes oil or grease spills. What can I do in the bathroom? Use night lights. Install grab bars by the toilet and in the tub and shower. Do not use towel bars as grab bars. Use non-skid mats or decals in the tub or shower. If you need to sit down in the shower, use a plastic, non-slip stool. Keep the floor dry. Clean up any water that spills on the floor  as soon as it happens. Remove soap buildup in the tub or shower regularly. Attach bath mats securely with double-sided non-slip rug tape. Do not have throw rugs and other things on the floor that can make you trip. What can I do in the bedroom? Use night lights. Make sure that you have a light by your bed that is easy to reach. Do not use any sheets or blankets that are too big for your bed. They should not hang down onto the floor. Have a firm chair that has side arms. You can use this for support while you get dressed. Do not have throw rugs and other things on the floor that can make you trip. What can I do in the kitchen? Clean up any spills right away. Avoid walking on wet floors. Keep items that you use a lot in easy-to-reach places. If you need to reach something above you, use a strong step stool that has a grab bar. Keep electrical cords out of the way. Do not use floor polish or wax that makes floors slippery. If you must use wax, use non-skid floor wax. Do not have throw rugs and other things  on the floor that can make you trip. What can I do with my stairs? Do not leave any items on the stairs. Make sure that there are handrails on both sides of the stairs and use them. Fix handrails that are broken or loose. Make sure that handrails are as long as the stairways. Check any carpeting to make sure that it is firmly attached to the stairs. Fix any carpet that is loose or worn. Avoid having throw rugs at the top or bottom of the stairs. If you do have throw rugs, attach them to the floor with carpet tape. Make sure that you have a light switch at the top of the stairs and the bottom of the stairs. If you do not have them, ask someone to add them for you. What else can I do to help prevent falls? Wear shoes that: Do not have high heels. Have rubber bottoms. Are comfortable and fit you well. Are closed at the toe. Do not wear sandals. If you use a stepladder: Make sure that it is  fully opened. Do not climb a closed stepladder. Make sure that both sides of the stepladder are locked into place. Ask someone to hold it for you, if possible. Clearly mark and make sure that you can see: Any grab bars or handrails. First and last steps. Where the edge of each step is. Use tools that help you move around (mobility aids) if they are needed. These include: Canes. Walkers. Scooters. Crutches. Turn on the lights when you go into a dark area. Replace any light bulbs as soon as they burn out. Set up your furniture so you have a clear path. Avoid moving your furniture around. If any of your floors are uneven, fix them. If there are any pets around you, be aware of where they are. Review your medicines with your doctor. Some medicines can make you feel dizzy. This can increase your chance of falling. Ask your doctor what other things that you can do to help prevent falls. This information is not intended to replace advice given to you by your health care provider. Make sure you discuss any questions you have with your health care provider. Document Released: 01/03/2009 Document Revised: 08/15/2015 Document Reviewed: 04/13/2014 Elsevier Interactive Patient Education  2017 [employer].

## 2022-03-03 ENCOUNTER — Ambulatory Visit: Payer: Medicare HMO | Attending: Cardiovascular Disease

## 2022-03-03 DIAGNOSIS — I351 Nonrheumatic aortic (valve) insufficiency: Secondary | ICD-10-CM

## 2022-03-03 LAB — ECHOCARDIOGRAM COMPLETE
AR max vel: 5.16 cm2
AV Area VTI: 4.89 cm2
AV Area mean vel: 5.36 cm2
AV Mean grad: 4 mmHg
AV Peak grad: 7.8 mmHg
AV Vena cont: 0.85 cm
Ao pk vel: 1.4 m/s
Area-P 1/2: 3.31 cm2
Calc EF: 52.6 %
P 1/2 time: 476 msec
S' Lateral: 4 cm
Single Plane A2C EF: 51.7 %
Single Plane A4C EF: 50.5 %

## 2022-03-06 NOTE — Progress Notes (Unsigned)
Cardiology Office Note  Date:  03/09/2022   ID:  Corey Williams, DOB 1945-08-14, MRN [id number]  PCP:  Owens Loffler, MD   Chief Complaint  Patient presents with   12 month follow up     Discuss Echo results. Medications reviewed by the patient verbally.     HPI:  Corey Williams is a  76 year old gentleman with history of  hyperlipidemia  Aortic valve moderate to severe regurgitation. unable to exclude bicuspid aortic valve,  borderline dilated Ascending aorta on echocardiogram in 06/12/13 , 3.8 cm in 06/12/2017 who presents for follow-up of his aortic valve regurgitation.  Last seen by myself in clinic December 2022  Feels well, denies significant shortness of breath on exertion, no leg swelling no PND orthopnea Does not feel any significant change over the past yea No regular exercise program but does stay active  Recent results reviewed, echocardiogram March 03, 2022 Low normal ejection fraction 50 to 55%, moderate to severe aortic valve regurgitation Mildly dilated left ventricle Diastolic relaxation abnormality Mild dilation of aortic root 44 mm, ascending aorta 41 mm, unchanged Appears relatively similar dating back to 06/12/2017  Stress  in 06-12-2020, daughter-in-law died suddenly  2 children, age 37 and 84  Covid positive 2020-06-12  Lab work reviewed Total cholesterol 163 LDL 96 Normal CBC and BMP  EKG personally reviewed by myself on todays visit Shows NSR rate 63 bpm nonspecific T wave abnormality V5, V6   Guidelines for aortic valve regurgitation surgery pulled up and discussed, discussed class I and class II indications Discussed things to watch for such as drop in ejection fraction and dilation of the left ventricle   PMH:   has a past medical history of Aortic insufficiency, Ascending aorta dilatation (HCC), Basal cell carcinoma (04/21/2021), Dilated aortic root ([place]), Hereditary essential tremor (12/28/2013), Hyperlipidemia, and Hypertension.  PSH:    Past Surgical History:   Procedure Laterality Date   no prior surgery      Current Outpatient Medications  Medication Sig Dispense Refill   aspirin 81 MG tablet Take 81 mg by mouth daily.     Calcium Carbonate (CALCIUM 600 PO) Take 1 tablet by mouth daily.     ibuprofen (ADVIL,MOTRIN) 200 MG tablet Take 200 mg by mouth every 6 (six) hours as needed.     losartan (COZAAR) 25 MG tablet Take 1 tablet (25 mg total) by mouth daily. 90 tablet 3   Multiple Vitamin (MULTIVITAMIN) tablet Take 1 tablet by mouth daily.     pravastatin (PRAVACHOL) 40 MG tablet Take 1 tablet by mouth once daily 90 tablet 3   No current facility-administered medications for this visit.    Allergies:   Patient has no known allergies.   Social History:  The patient  reports that he quit smoking about 48 years ago. His smoking use included cigarettes. He has never used smokeless tobacco. He reports current alcohol use of about 5.0 standard drinks of alcohol per week. He reports that he does not use drugs.   Family History:   family history includes Stroke in his father.    Review of Systems: Review of Systems  Constitutional: Negative.   HENT: Negative.    Respiratory: Negative.    Cardiovascular: Negative.   Gastrointestinal: Negative.   Musculoskeletal: Negative.   Neurological: Negative.   Psychiatric/Behavioral: Negative.    All other systems reviewed and are negative.   PHYSICAL EXAM: VS:  BP 130/60 (BP Location: Left Arm)   Pulse 65   Ht 6' (  1.829 m)   Wt 215 lb 8 oz (97.8 kg)   SpO2 97%   BMI 29.23 kg/m  , BMI Body mass index is 29.23 kg/m. Constitutional:  oriented to person, place, and time. No distress.  HENT:  Head: Grossly normal Eyes:  no discharge. No scleral icterus.  Neck: No JVD, no carotid bruits  Cardiovascular: Regular rate and rhythm, no murmurs appreciated Pulmonary/Chest: Clear to auscultation bilaterally, no wheezes or rails Abdominal: Soft.  no distension.  no tenderness.  Musculoskeletal:  Normal range of motion Neurological:  normal muscle tone. Coordination normal. No atrophy Skin: Skin warm and dry Psychiatric: normal affect, pleasant   Recent Labs: 03/26/2021: ALT 15; BUN 15; Creatinine, Ser 1.00; Hemoglobin 13.8; Platelets 229.0; Potassium 4.5; Sodium 140    Lipid Panel Lab Results  Component Value Date   CHOL 163 03/26/2021   HDL 39.60 03/26/2021   LDLCALC 96 03/26/2021   TRIG 133.0 03/26/2021      Wt Readings from Last 3 Encounters:  03/09/22 215 lb 8 oz (97.8 kg)  01/20/22 215 lb (97.5 kg)  03/26/21 215 lb 4 oz (97.6 kg)     ASSESSMENT AND PLAN:  Aortic valve regurgitation  Discussed recent echocardiogram findings, he reports he is asymptomatic Mild dilation of left ventricle, mildly decreased ejection fraction, unchanged from prior studies Feels well, asymptomatic, exercising Not interested in surgical options at this time Recommended repeat echocardiogram in 1 year and contact office for any change in symptoms such as increasing shortness of breath, leg edema, PND orthopnea  HYPERCHOLESTEROLEMIA  CT scan chest reviewed, no significant calcification in aorta, minimal if any coronary calcification Continue pravastatin  Aneurysm of ascending aorta (HCC) Mildly dilated aorta root and ascending aorta, unchanged over the past several years   Total encounter time more than 30 minutes  Greater than 50% was spent in counseling and coordination of care with the patient    No orders of the defined types were placed in this encounter.    Signed, Esmond Plants, M.D., Ph.D. 03/09/2022  [place], [place]

## 2022-03-09 ENCOUNTER — Encounter: Payer: Self-pay | Admitting: Cardiovascular Disease

## 2022-03-09 ENCOUNTER — Ambulatory Visit: Payer: Medicare HMO | Attending: Cardiovascular Disease | Admitting: Cardiovascular Disease

## 2022-03-09 VITALS — BP 130/60 | HR 65 | Ht 72.0 in | Wt 215.5 lb

## 2022-03-09 DIAGNOSIS — I351 Nonrheumatic aortic (valve) insufficiency: Secondary | ICD-10-CM | POA: Diagnosis not present

## 2022-03-09 DIAGNOSIS — I7121 Aneurysm of the ascending aorta, without rupture: Secondary | ICD-10-CM

## 2022-03-09 DIAGNOSIS — I10 Essential (primary) hypertension: Secondary | ICD-10-CM

## 2022-03-09 DIAGNOSIS — I77819 Aortic ectasia, unspecified site: Secondary | ICD-10-CM

## 2022-03-09 DIAGNOSIS — E7800 Pure hypercholesterolemia, unspecified: Secondary | ICD-10-CM | POA: Diagnosis not present

## 2022-03-09 MED ORDER — LOSARTAN POTASSIUM 25 MG PO TABS: 25.0000 mg | ORAL_TABLET | Freq: Every day | ORAL | 3 refills | Status: DC

## 2022-03-09 NOTE — Patient Instructions (Addendum)
Medication Instructions:  No changes  If you need a refill on your cardiac medications before your next appointment, please call your pharmacy.   Lab work: No new labs needed  Testing/Procedures: Echo in one year for aortic valve regurgitation Your physician has requested that you have an echocardiogram. Echocardiography is a painless test that uses sound waves to create images of your heart. It provides your doctor with information about the size and shape of your heart and how well your heart's chambers and valves are working. This procedure takes approximately one hour. There are no restrictions for this procedure. Please do NOT wear cologne, perfume, aftershave, or lotions (deodorant is allowed). Please arrive 15 minutes prior to your appointment time.  Follow-Up: At [facility], you and your health needs are our priority.  As part of our continuing mission to provide you with exceptional heart care, we have created designated Provider Care Teams.  These Care Teams include your primary Cardiologist (physician) and Advanced Practice Providers (APPs -  Physician Assistants and Nurse Practitioners) who all work together to provide you with the care you need, when you need it.  You will need a follow up appointment in 12 months  Providers on your designated Care Team:   Murray Hodgkins, NP Christell Faith, PA-C Cadence Kathlen Mody, [place]  COVID-19 Vaccine Information can be found at: [email or web address].uk For questions related to vaccine distribution or appointments, please email [email or web address] or call 5400615270.

## 2022-03-09 NOTE — Addendum Note (Signed)
Addended by: Raelene Bott, Azaria Stegman L on: 03/09/2022 09:04 AM   Modules accepted: Orders

## 2022-03-11 ENCOUNTER — Other Ambulatory Visit: Payer: Self-pay | Admitting: Family Medicine

## 2022-03-11 DIAGNOSIS — Z125 Encounter for screening for malignant neoplasm of prostate: Secondary | ICD-10-CM

## 2022-03-11 DIAGNOSIS — Z79899 Other long term (current) drug therapy: Secondary | ICD-10-CM

## 2022-03-11 DIAGNOSIS — E7800 Pure hypercholesterolemia, unspecified: Secondary | ICD-10-CM

## 2022-03-24 ENCOUNTER — Other Ambulatory Visit (INDEPENDENT_AMBULATORY_CARE_PROVIDER_SITE_OTHER): Payer: Medicare HMO

## 2022-03-24 DIAGNOSIS — E7800 Pure hypercholesterolemia, unspecified: Secondary | ICD-10-CM

## 2022-03-24 DIAGNOSIS — Z125 Encounter for screening for malignant neoplasm of prostate: Secondary | ICD-10-CM

## 2022-03-24 DIAGNOSIS — Z79899 Other long term (current) drug therapy: Secondary | ICD-10-CM

## 2022-03-24 LAB — CBC WITH DIFFERENTIAL/PLATELET
Basophils Absolute: 0.1 10*3/uL (ref 0.0–0.1)
Basophils Relative: 1 % (ref 0.0–3.0)
Eosinophils Absolute: 0.5 10*3/uL (ref 0.0–0.7)
Eosinophils Relative: 7.8 % — ABNORMAL HIGH (ref 0.0–5.0)
HCT: 41.3 % (ref 39.0–52.0)
Hemoglobin: 13.8 g/dL (ref 13.0–17.0)
Lymphocytes Relative: 21.3 % (ref 12.0–46.0)
Lymphs Abs: 1.4 10*3/uL (ref 0.7–4.0)
MCHC: 33.4 g/dL (ref 30.0–36.0)
MCV: 96.3 fl (ref 78.0–100.0)
Monocytes Absolute: 0.6 10*3/uL (ref 0.1–1.0)
Monocytes Relative: 9.1 % (ref 3.0–12.0)
Neutro Abs: 4 10*3/uL (ref 1.4–7.7)
Neutrophils Relative %: 60.8 % (ref 43.0–77.0)
Platelets: 249 10*3/uL (ref 150.0–400.0)
RBC: 4.29 Mil/uL (ref 4.22–5.81)
RDW: 12.9 % (ref 11.5–15.5)
WBC: 6.5 10*3/uL (ref 4.0–10.5)

## 2022-03-24 LAB — BASIC METABOLIC PANEL
BUN: 17 mg/dL (ref 6–23)
CO2: 31 mEq/L (ref 19–32)
Calcium: 10 mg/dL (ref 8.4–10.5)
Chloride: 103 mEq/L (ref 96–112)
Creatinine, Ser: 0.93 mg/dL (ref 0.40–1.50)
GFR: 79.81 mL/min (ref >60.00)
Glucose, Bld: 93 mg/dL (ref 70–99)
Potassium: 5.2 mEq/L — ABNORMAL HIGH (ref 3.5–5.1)
Sodium: 141 mEq/L (ref 135–145)

## 2022-03-24 LAB — HEPATIC FUNCTION PANEL
ALT: 15 U/L (ref 0–53)
AST: 18 U/L (ref 0–37)
Albumin: 4.3 g/dL (ref 3.5–5.2)
Alkaline Phosphatase: 63 U/L (ref 39–117)
Bilirubin, Direct: 0.1 mg/dL (ref 0.0–0.3)
Total Bilirubin: 0.8 mg/dL (ref 0.2–1.2)
Total Protein: 7.5 g/dL (ref 6.0–8.3)

## 2022-03-24 LAB — LIPID PANEL
Cholesterol: 172 mg/dL (ref 0–200)
HDL: 40.8 mg/dL (ref >39.00)
LDL Cholesterol: 107 mg/dL — ABNORMAL HIGH (ref 0–99)
NonHDL: 131.51
Total CHOL/HDL Ratio: 4
Triglycerides: 123 mg/dL (ref 0.0–149.0)
VLDL: 24.6 mg/dL (ref 0.0–40.0)

## 2022-03-24 LAB — PSA, MEDICARE: PSA: 1.05 ng/ml (ref 0.10–4.00)

## 2022-03-25 ENCOUNTER — Encounter: Payer: Self-pay | Admitting: Family Medicine

## 2022-03-28 NOTE — Progress Notes (Unsigned)
Corey Turvey T. Nollan Muldrow, MD, [place] at [facility] [place], [address]  Phone: [phone]  FAX: [phone]  CARLSON BELLAND - 77 y.o. male  MRN [id number]  Date of Birth: 1945/11/27  Date: 03/30/2022  PCP: Owens Loffler, MD  Referral: Owens Loffler, MD  No chief complaint on file.  Patient Care Team: Owens Loffler, MD as PCP - General (Family Medicine) Subjective:   Corey Williams is a 77 y.o. pleasant patient who presents with the following:  Preventative Health Maintenance Visit:  Health Maintenance Summary Reviewed and updated, unless pt declines services.  Tobacco History Reviewed. Alcohol: No concerns, no excessive use Exercise Habits: Some activity, rec at least 30 mins 5 times a week STD concerns: no risk or activity to increase risk Drug Use: None  Tdap Covid booster Shingrix booster Flu RSV  Cardiology follow mod-severe aortic regurg    Health Maintenance  Topic Date Due   DTaP/Tdap/Td (2 - Td or Tdap) 01/27/2021   COVID-19 Vaccine (4 - 2023-24 season) 11/21/2021   INFLUENZA VACCINE  06/21/2022 (Originally 10/21/2021)   Zoster Vaccines- Shingrix (1 of 2) 07/22/2022 (Originally 10/22/1964)   Medicare Annual Wellness (AWV)  01/21/2023   Pneumonia Vaccine 19+ Years old  Completed   Hepatitis C Screening  Completed   HPV VACCINES  Aged Out   COLONOSCOPY (Pts 45-40yr Insurance coverage will need to be confirmed)  Discontinued   Immunization History  Administered Date(s) Administered   Influenza, High Dose Seasonal PF 01/08/2021   Influenza,inj,Quad PF,6+ Mos 01/17/2013, 12/27/2013, 01/07/2015, 01/08/2016   Influenza-Unspecified 03/01/2020   PFIZER(Purple Top)SARS-COV-2 Vaccination 05/05/2019, 05/30/2019, 12/23/2019   Pneumococcal Conjugate-13 12/27/2013   Pneumococcal Polysaccharide-23 12/22/2012   Tdap 01/28/2011   Patient Active Problem List   Diagnosis Date Noted   Hereditary  essential tremor 12/28/2013   Aortic valve regurgitation 10/13/2013   [place]   HYPERCHOLESTEROLEMIA 06/05/2010   ACTINIC KERATOSIS, HEAD 06/05/2010    Past Medical History:  Diagnosis Date   Aortic insufficiency    Ascending aorta dilatation (HCC)    Basal cell carcinoma 04/21/2021   right lateral neck below the ear, [facility]   [place]    Hereditary essential tremor 12/28/2013   Hyperlipidemia    Hypertension     Past Surgical History:  Procedure Laterality Date   no prior surgery      Family History  Problem Relation Age of Onset   Stroke Father    Colon cancer Neg Hx    Stomach cancer Neg Hx     Social History   Social History Narrative   Regular exercise--yes    Past Medical History, Surgical History, Social History, Family History, Problem List, Medications, and Allergies have been reviewed and updated if relevant.  Review of Systems: Pertinent positives are listed above.  Otherwise, a full 14 point review of systems has been done in full and it is negative except where it is noted positive.  Objective:   There were no vitals taken for this visit. Ideal Body Weight:    Ideal Body Weight:   No results found.    01/20/2022    9:52 AM 03/26/2021   11:14 AM 03/04/2020    8:40 AM 02/27/2019    8:30 AM 02/23/2018    8:07 AM  Depression screen PHQ 2/9  Decreased Interest 0 0 0 0 0  Down, Depressed, Hopeless 0 0 0 0 0  PHQ -  2 Score 0 0 0 0 0     GEN: well developed, well nourished, no acute distress Eyes: conjunctiva and lids normal, PERRLA, EOMI ENT: TM clear, nares clear, oral exam WNL Neck: supple, no lymphadenopathy, no thyromegaly, no JVD Pulm: clear to auscultation and percussion, respiratory effort normal CV: regular rate and rhythm, S1-S2, no murmur, rub or gallop, no bruits, peripheral pulses normal and symmetric, no cyanosis, clubbing, edema or varicosities GI: soft, non-tender; no  hepatosplenomegaly, masses; active bowel sounds all quadrants GU: deferred Lymph: no cervical, axillary or inguinal adenopathy MSK: gait normal, muscle tone and strength WNL, no joint swelling, effusions, discoloration, crepitus  SKIN: clear, good turgor, color WNL, no rashes, lesions, or ulcerations Neuro: normal mental status, normal strength, sensation, and motion Psych: alert; oriented to person, place and time, normally interactive and not anxious or depressed in appearance.  All labs reviewed with patient. Results for orders placed or performed in visit on 03/24/22  PSA, Medicare  Result Value Ref Range   PSA 1.05 0.10 - 4.00 ng/ml  Lipid panel  Result Value Ref Range   Cholesterol 172 0 - 200 mg/dL   Triglycerides 123.0 0.0 - 149.0 mg/dL   HDL 40.80 >39.00 mg/dL   VLDL 24.6 0.0 - 40.0 mg/dL   LDL Cholesterol 107 (H) 0 - 99 mg/dL   Total CHOL/HDL Ratio 4    NonHDL 131.51   Hepatic function panel  Result Value Ref Range   Total Bilirubin 0.8 0.2 - 1.2 mg/dL   Bilirubin, Direct 0.1 0.0 - 0.3 mg/dL   Alkaline Phosphatase 63 39 - 117 U/L   AST 18 0 - 37 U/L   ALT 15 0 - 53 U/L   Total Protein 7.5 6.0 - 8.3 g/dL   Albumin 4.3 3.5 - 5.2 g/dL  CBC with Differential/Platelet  Result Value Ref Range   WBC 6.5 4.0 - 10.5 K/uL   RBC 4.29 4.22 - 5.81 Mil/uL   Hemoglobin 13.8 13.0 - 17.0 g/dL   HCT 41.3 39.0 - 52.0 %   MCV 96.3 78.0 - 100.0 fl   MCHC 33.4 30.0 - 36.0 g/dL   RDW 12.9 11.5 - 15.5 %   Platelets 249.0 150.0 - 400.0 K/uL   Neutrophils Relative % 60.8 43.0 - 77.0 %   Lymphocytes Relative 21.3 12.0 - 46.0 %   Monocytes Relative 9.1 3.0 - 12.0 %   Eosinophils Relative 7.8 (H) 0.0 - 5.0 %   Basophils Relative 1.0 0.0 - 3.0 %   Neutro Abs 4.0 1.4 - 7.7 K/uL   Lymphs Abs 1.4 0.7 - 4.0 K/uL   Monocytes Absolute 0.6 0.1 - 1.0 K/uL   Eosinophils Absolute 0.5 0.0 - 0.7 K/uL   Basophils Absolute 0.1 0.0 - 0.1 K/uL  Basic metabolic panel  Result Value Ref Range   Sodium  141 135 - 145 mEq/L   Potassium 5.2 (H) 3.5 - 5.1 mEq/L   Chloride 103 96 - 112 mEq/L   CO2 31 19 - 32 mEq/L   Glucose, Bld 93 70 - 99 mg/dL   BUN 17 6 - 23 mg/dL   Creatinine, Ser 0.93 0.40 - 1.50 mg/dL   GFR 79.81 >60.00 mL/min   Calcium 10.0 8.4 - 10.5 mg/dL    Assessment and Plan:     ICD-10-CM   1. Healthcare maintenance  Z00.00       Health Maintenance Exam: The patient's preventative maintenance and recommended screening tests for an annual wellness exam were reviewed in  full today. Brought up to date unless services declined.  Counselled on the importance of diet, exercise, and its role in overall health and mortality. The patient's FH and SH was reviewed, including their home life, tobacco status, and drug and alcohol status.  Follow-up in 1 year for physical exam or additional follow-up below.  Disposition: No follow-ups on file.  No orders of the defined types were placed in this encounter.  There are no discontinued medications. No orders of the defined types were placed in this encounter.   Signed,  Maud Deed. Aysha Livecchi, MD   Allergies as of 03/30/2022   No Known Allergies      Medication List        Accurate as of March 28, 2022  8:35 AM. If you have any questions, ask your nurse or doctor.          aspirin 81 MG tablet Take 81 mg by mouth daily.   CALCIUM 600 PO Take 1 tablet by mouth daily.   ibuprofen 200 MG tablet Commonly known as: ADVIL Take 200 mg by mouth every 6 (six) hours as needed.   losartan 25 MG tablet Commonly known as: COZAAR Take 1 tablet (25 mg total) by mouth daily.   multivitamin tablet Take 1 tablet by mouth daily.   pravastatin 40 MG tablet Commonly known as: PRAVACHOL Take 1 tablet by mouth once daily

## 2022-03-30 ENCOUNTER — Encounter: Payer: Self-pay | Admitting: Family Medicine

## 2022-03-30 ENCOUNTER — Ambulatory Visit (INDEPENDENT_AMBULATORY_CARE_PROVIDER_SITE_OTHER): Payer: Medicare HMO | Admitting: Family Medicine

## 2022-03-30 VITALS — BP 138/60 | HR 61 | Temp 97.8°F | Ht 72.0 in | Wt 214.0 lb

## 2022-03-30 DIAGNOSIS — Z0000 Encounter for general adult medical examination without abnormal findings: Secondary | ICD-10-CM

## 2022-03-30 NOTE — Patient Instructions (Addendum)
Tetanus shot (Tdap)  Covid - think about getting the booster shot  RSV - think about getting this vaccine  Voltaren 1% gel, over the counter You can apply up to 4 times a day  This can be applied to any joint: knee, wrist, fingers, elbows, shoulders, feet and ankles. Can apply to any tendon: tennis elbow, achilles, tendon, rotator cuff or any other tendon.  Minimal is absorbed in the bloodstream: ok with oral anti-inflammatory or a blood thinner.  Cost is about 9 dollars

## 2022-04-22 ENCOUNTER — Ambulatory Visit: Payer: Medicare HMO | Admitting: Dermatology

## 2022-05-19 NOTE — Progress Notes (Unsigned)
    Corey Byers T. Ilani Otterson, MD, [place] at [facility] [place], [address]  Phone: [phone]  FAX: [phone]  Corey Williams - 77 y.o. male  MRN [id number]  Date of Birth: 01-May-1945  Date: 05/20/2022  PCP: Owens Loffler, MD  Referral: Owens Loffler, MD  No chief complaint on file.  Subjective:   Corey Williams is a 77 y.o. very pleasant male patient with There is no height or weight on file to calculate BMI. who presents with the following:  Pleasant gentleman presents with leg pain.    Review of Systems is noted in the HPI, as appropriate  Objective:   There were no vitals taken for this visit.  GEN: No acute distress; alert,appropriate. PULM: Breathing comfortably in no respiratory distress PSYCH: Normally interactive.   Laboratory and Imaging Data:  Assessment and Plan:   ***

## 2022-05-20 ENCOUNTER — Ambulatory Visit (INDEPENDENT_AMBULATORY_CARE_PROVIDER_SITE_OTHER): Payer: Medicare HMO | Admitting: Family Medicine

## 2022-05-20 ENCOUNTER — Encounter: Payer: Self-pay | Admitting: Family Medicine

## 2022-05-20 VITALS — BP 130/60 | HR 66 | Temp 97.8°F | Ht 72.0 in | Wt 219.5 lb

## 2022-05-20 DIAGNOSIS — M5442 Lumbago with sciatica, left side: Secondary | ICD-10-CM | POA: Diagnosis not present

## 2022-05-20 MED ORDER — PRAVASTATIN SODIUM 40 MG PO TABS: 40.0000 mg | ORAL_TABLET | Freq: Every day | ORAL | 3 refills | Status: DC

## 2022-05-20 MED ORDER — CELECOXIB 200 MG PO CAPS: 200.0000 mg | ORAL_CAPSULE | Freq: Every day | ORAL | 2 refills | Status: DC

## 2022-08-18 DIAGNOSIS — H524 Presbyopia: Secondary | ICD-10-CM | POA: Diagnosis not present

## 2022-08-18 DIAGNOSIS — Z0100 Encounter for examination of eyes and vision without abnormal findings: Secondary | ICD-10-CM | POA: Diagnosis not present

## 2022-08-18 DIAGNOSIS — H35373 Puckering of macula, bilateral: Secondary | ICD-10-CM | POA: Diagnosis not present

## 2022-08-18 DIAGNOSIS — H52223 Regular astigmatism, bilateral: Secondary | ICD-10-CM | POA: Diagnosis not present

## 2022-08-18 DIAGNOSIS — H2513 Age-related nuclear cataract, bilateral: Secondary | ICD-10-CM | POA: Diagnosis not present

## 2022-08-18 DIAGNOSIS — H25013 Cortical age-related cataract, bilateral: Secondary | ICD-10-CM | POA: Diagnosis not present

## 2022-08-18 DIAGNOSIS — H5213 Myopia, bilateral: Secondary | ICD-10-CM | POA: Diagnosis not present

## 2022-10-17 ENCOUNTER — Other Ambulatory Visit: Payer: Self-pay

## 2022-10-17 ENCOUNTER — Emergency Department: Payer: Medicare HMO

## 2022-10-17 ENCOUNTER — Emergency Department
Admission: EM | Admit: 2022-10-17 | Discharge: 2022-10-17 | Disposition: A | Discharge status: Home / Self Care | Payer: Medicare HMO | Source: Home / Self Care | Attending: Emergency Medicine | Admitting: Emergency Medicine

## 2022-10-17 DIAGNOSIS — Z79899 Other long term (current) drug therapy: Secondary | ICD-10-CM | POA: Diagnosis not present

## 2022-10-17 DIAGNOSIS — R319 Hematuria, unspecified: Secondary | ICD-10-CM | POA: Insufficient documentation

## 2022-10-17 DIAGNOSIS — Z7982 Long term (current) use of aspirin: Secondary | ICD-10-CM | POA: Insufficient documentation

## 2022-10-17 DIAGNOSIS — I10 Essential (primary) hypertension: Secondary | ICD-10-CM | POA: Insufficient documentation

## 2022-10-17 DIAGNOSIS — N281 Cyst of kidney, acquired: Secondary | ICD-10-CM | POA: Diagnosis not present

## 2022-10-17 DIAGNOSIS — K5730 Diverticulosis of large intestine without perforation or abscess without bleeding: Secondary | ICD-10-CM | POA: Diagnosis not present

## 2022-10-17 DIAGNOSIS — N400 Enlarged prostate without lower urinary tract symptoms: Secondary | ICD-10-CM | POA: Diagnosis not present

## 2022-10-17 DIAGNOSIS — K429 Umbilical hernia without obstruction or gangrene: Secondary | ICD-10-CM | POA: Diagnosis not present

## 2022-10-17 DIAGNOSIS — R310 Gross hematuria: Secondary | ICD-10-CM | POA: Diagnosis not present

## 2022-10-17 LAB — BASIC METABOLIC PANEL
Anion gap: 7 (ref 5–15)
BUN: 13 mg/dL (ref 8–23)
CO2: 25 mmol/L (ref 22–32)
Calcium: 8.6 mg/dL — ABNORMAL LOW (ref 8.9–10.3)
Chloride: 101 mmol/L (ref 98–111)
Creatinine, Ser: 0.99 mg/dL (ref 0.61–1.24)
GFR, Estimated: >60 mL/min (ref >60)
Glucose, Bld: 104 mg/dL — ABNORMAL HIGH (ref 70–99)
Potassium: 3.6 mmol/L (ref 3.5–5.1)
Sodium: 133 mmol/L — ABNORMAL LOW (ref 135–145)

## 2022-10-17 LAB — URINALYSIS, ROUTINE W REFLEX MICROSCOPIC
Bilirubin Urine: NEGATIVE
Glucose, UA: NEGATIVE mg/dL
Ketones, ur: NEGATIVE mg/dL
Leukocytes,Ua: NEGATIVE
Nitrite: NEGATIVE
Protein, ur: 100 mg/dL — AB
Specific Gravity, Urine: 1.004 — ABNORMAL LOW (ref 1.005–1.030)
pH: 6 (ref 5.0–8.0)

## 2022-10-17 LAB — CBC WITH DIFFERENTIAL/PLATELET
Abs Immature Granulocytes: 0.01 10*3/uL (ref 0.00–0.07)
Basophils Absolute: 0.1 10*3/uL (ref 0.0–0.1)
Basophils Relative: 1 %
Eosinophils Absolute: 0.5 10*3/uL (ref 0.0–0.5)
Eosinophils Relative: 9 %
HCT: 40.4 % (ref 39.0–52.0)
Hemoglobin: 13.2 g/dL (ref 13.0–17.0)
Immature Granulocytes: 0 %
Lymphocytes Relative: 32 %
Lymphs Abs: 1.9 10*3/uL (ref 0.7–4.0)
MCH: 32 pg (ref 26.0–34.0)
MCHC: 32.7 g/dL (ref 30.0–36.0)
MCV: 98.1 fL (ref 80.0–100.0)
Monocytes Absolute: 0.5 10*3/uL (ref 0.1–1.0)
Monocytes Relative: 9 %
Neutro Abs: 2.8 10*3/uL (ref 1.7–7.7)
Neutrophils Relative %: 49 %
Platelets: 202 10*3/uL (ref 150–400)
RBC: 4.12 MIL/uL — ABNORMAL LOW (ref 4.22–5.81)
RDW: 11.9 % (ref 11.5–15.5)
WBC: 5.7 10*3/uL (ref 4.0–10.5)
nRBC: 0 % (ref 0.0–0.2)

## 2022-10-17 MED ORDER — CIPROFLOXACIN HCL 500 MG PO TABS: 500.0000 mg | ORAL_TABLET | Freq: Two times a day (BID) | ORAL | 0 refills | Status: DC

## 2022-10-17 MED ORDER — CIPROFLOXACIN HCL 500 MG PO TABS
500.0000 mg | ORAL_TABLET | Freq: Once | ORAL | Status: AC
  Administered 2022-10-17: 500 mg via ORAL
  Filled 2022-10-17: qty 1

## 2022-10-17 NOTE — ED Provider Notes (Signed)
[facility] Provider Note    Event Date/Time   First MD Initiated Contact with Patient 10/17/22 [phone]     (approximate)   History   Hematuria   HPI  Corey Williams is a 77 y.o. male who presents to the ED from home with a chief complaint of hematuria.  Patient went to sleep in his usual state of health, awoke around 3 AM to urinate and noted grossly bloody urine without clots.  Denies Miller symptoms previously.  Denies fever/chills, cough, chest pain, shortness of breath, abdominal/flank pain, dysuria, testicular pain or swelling.  No prior history of kidney stones but family history of.     Past Medical History   Past Medical History:  Diagnosis Date   Aortic insufficiency    Ascending aorta dilatation (HCC)    Basal cell carcinoma 04/21/2021   right lateral neck below the ear, EDC   Dilated aortic root (HCC)    Hereditary essential tremor 12/28/2013   Hyperlipidemia    Hypertension      Active Problem List   Patient Active Problem List   Diagnosis Date Noted   Hereditary essential tremor 12/28/2013   Aortic valve regurgitation 10/13/2013   Aneurysm of ascending aorta (HCC) 10/13/2013   HYPERCHOLESTEROLEMIA 06/05/2010   ACTINIC KERATOSIS, HEAD 06/05/2010     Past Surgical History   Past Surgical History:  Procedure Laterality Date   no prior surgery       Home Medications   Prior to Admission medications   Medication Sig Start Date End Date Taking? Authorizing Provider  aspirin 81 MG tablet Take 81 mg by mouth daily.    [provider]  Calcium Carbonate (CALCIUM 600 PO) Take 1 tablet by mouth daily.    [provider]  celecoxib (CELEBREX) 200 MG capsule Take 1 capsule (200 mg total) by mouth daily. 05/20/22   Copland, Karleen Hampshire, MD  ibuprofen (ADVIL,MOTRIN) 200 MG tablet Take 200 mg by mouth every 6 (six) hours as needed.    [provider]  losartan (COZAAR) 25 MG tablet Take 1 tablet (25 mg total) by  mouth daily. 03/09/22   Antonieta Iba, MD  Multiple Vitamin (MULTIVITAMIN) tablet Take 1 tablet by mouth daily.    [provider]  pravastatin (PRAVACHOL) 40 MG tablet Take 1 tablet (40 mg total) by mouth daily. 05/20/22   Hannah Beat, MD     Allergies  Patient has no known allergies.   Family History   Family History  Problem Relation Age of Onset   Stroke Father    Colon cancer Neg Hx    Stomach cancer Neg Hx      Physical Exam  Triage Vital Signs: ED Triage Vitals  Encounter Vitals Group     BP 10/17/22 0328 (!) 152/79     Systolic BP Percentile --      Diastolic BP Percentile --      Pulse Rate 10/17/22 0328 60     Resp 10/17/22 0328 15     Temp 10/17/22 0328 98 F (36.7 C)     Temp Source 10/17/22 0328 Oral     SpO2 10/17/22 0328 98 %     Weight 10/17/22 0325 219 lb (99.3 kg)     Height 10/17/22 0325 6' (1.829 m)     Head Circumference --      Peak Flow --      Pain Score 10/17/22 0324 0     Pain Loc --  Pain Education --      Exclude from Growth Chart --     Updated Vital Signs: BP (!) 152/79 (BP Location: Left Arm)   Pulse 60   Temp 98 F (36.7 C) (Oral)   Resp 15   Ht 6' (1.829 m)   Wt 99.3 kg   SpO2 98%   BMI 29.70 kg/m    General: Awake, no distress.  CV:  RRR.  Good peripheral perfusion.  Resp:  Normal effort.  CTAB. Abd:  Nontender to light or deep palpation.  No distention.  Other:  No CVAT.  No truncal vesicles.   ED Results / Procedures / Treatments  Labs (all labs ordered are listed, but only abnormal results are displayed) Labs Reviewed  URINALYSIS, ROUTINE W REFLEX MICROSCOPIC - Abnormal; Notable for the following components:      Result Value   Color, Urine AMBER (*)    APPearance CLEAR (*)    Specific Gravity, Urine 1.004 (*)    Hgb urine dipstick LARGE (*)    Protein, ur 100 (*)    Bacteria, UA RARE (*)    All other components within normal limits  CBC WITH DIFFERENTIAL/PLATELET - Abnormal; Notable  for the following components:   RBC 4.12 (*)    All other components within normal limits  BASIC METABOLIC PANEL - Abnormal; Notable for the following components:   Sodium 133 (*)    Glucose, Bld 104 (*)    Calcium 8.6 (*)    All other components within normal limits  URINE CULTURE     EKG  None   RADIOLOGY I have independently visualized and interpreted patient's CT scan as well as noted the radiology interpretation:  CT renal stone study: No stones, blood products and urinary bladder lumen  Official radiology report(s): CT Renal Stone Study  Result Date: 10/17/2022 CLINICAL DATA:  Gross hematuria. EXAM: CT ABDOMEN AND PELVIS WITHOUT CONTRAST TECHNIQUE: Multidetector CT imaging of the abdomen and pelvis was performed following the standard protocol without IV contrast. RADIATION DOSE REDUCTION: This exam was performed according to the departmental dose-optimization program which includes automated exposure control, adjustment of the mA and/or kV according to patient size and/or use of iterative reconstruction technique. COMPARISON:  None Available. FINDINGS: Lower chest: A 6 mm, partially imaged, pleural based lung nodule versus focal scar seen within the posterolateral aspect of the right lower lobe (axial CT image 1, CT series 4). Hepatobiliary: A 1.4 cm x 0.7 cm focus of parenchymal low attenuation is seen within the medial aspect of the right lobe of the liver (axial CT image 18, CT series 2). No gallstones, gallbladder wall thickening, or biliary dilatation. Pancreas: Unremarkable. No pancreatic ductal dilatation or surrounding inflammatory changes. Spleen: Normal in size without focal abnormality. Adrenals/Urinary Tract: Adrenal glands are unremarkable. Kidneys are normal in size, without obstructing renal calculi or hydronephrosis. Numerous bilateral renal cysts of various sizes are seen. The urinary bladder is poorly distended and subsequently limited in evaluation. A 2.0 cm x 1.6  cm x 1.4 cm ill-defined area of mildly increased attenuation (approximately 33.92 Hounsfield units) is seen within the posterior aspect of the urinary bladder lumen on the right (axial CT image 87, CT series 2/coronal re-formatted image 61, CT series 5). Stomach/Bowel: Stomach is within normal limits. Appendix appears normal. No evidence of bowel wall thickening, distention, or inflammatory changes. Noninflamed diverticula are seen throughout the descending and sigmoid colon. Vascular/Lymphatic: Aortic atherosclerosis. No enlarged abdominal or pelvic lymph nodes. Reproductive: The prostate  gland is moderately enlarged. Mass effect is seen along the base of the urinary bladder. Other: There is a 2.9 cm x 2.4 cm x 3.4 cm fat containing umbilical hernia. A 6.1 cm x 3.2 cm fat containing right inguinal hernia is seen. An additional 4.5 cm x 2.4 cm fat containing left inguinal hernia is also noted. No abdominopelvic ascites. Musculoskeletal: Marked severity multilevel degenerative changes are seen throughout the lumbar spine. IMPRESSION: 1. Area within the urinary bladder lumen, as described above, which may represent a small amount of blood products. Correlation with cystoscopy is recommended, as a small urinary bladder mass cannot be excluded. 2. Numerous bilateral renal cysts. No follow-up imaging is recommended. This recommendation follows ACR consensus guidelines: Management of the Incidental Renal Mass on CT: A White Paper of the ACR Incidental Findings Committee. J Am Coll Radiol [phone]. 3. Colonic diverticulosis. 4. Fat containing umbilical and bilateral inguinal hernias. 5. 6 mm, partially imaged, right lower lobe pleural based lung nodule versus focal scar. Further evaluation with dedicated, nonemergent chest CT is recommended. 6. Aortic atherosclerosis. Aortic Atherosclerosis (ICD10-I70.0). Electronically Signed   By: Aram Candela M.D.   On: 10/17/2022 04:25     PROCEDURES:  Critical Care  performed: No  Procedures   MEDICATIONS ORDERED IN ED: Medications - No data to display   IMPRESSION / MDM / ASSESSMENT AND PLAN / ED COURSE  I reviewed the triage vital signs and the nursing notes.                             77 year old male presenting with hematuria. Differential diagnosis includes, but is not limited to, acute appendicitis, renal colic, testicular torsion, urinary tract infection/pyelonephritis, prostatitis,  epididymitis, diverticulitis, small bowel obstruction or ileus, colitis, abdominal aortic aneurysm, gastroenteritis, hernia, etc. personally reviewed reviewed patient's records and note and ophthalmology office visit on 08/18/2022 for presbyopia.  Patient's presentation is most consistent with acute complicated illness / injury requiring diagnostic workup.  Will obtain basic lab work, UA, CT renal stone study and reassess.  Clinical Course as of 10/17/22 0438  Sat Oct 17, 2022  0436 Hemoglobin 13, renal function unremarkable.  UA demonstrates 21-50 RBCs.  CT negative for stones.  Urine culture added.  Will empirically treat with Cipro and refer to urology for outpatient follow-up with likely cystoscopy to rule out bladder mass.  Strict return precautions given.  Patient and spouse verbalized understanding agree with plan of care. [JS]    Clinical Course User Index [JS] Irean Hong, MD     FINAL CLINICAL IMPRESSION(S) / ED DIAGNOSES   Final diagnoses:  Hematuria, unspecified type     Rx / DC Orders   ED Discharge Orders     None        Note:  This document was prepared using Dragon voice recognition software and may include unintentional dictation errors.   Irean Hong, MD 10/17/22 [phone]

## 2022-10-17 NOTE — ED Triage Notes (Signed)
Pt c/o bloody urine that he first noticed around 3 am this morning. No blood clots. No abdominal or flank pain/tenderness.   Pt brings a sample to the ER that is tea colored.

## 2022-10-17 NOTE — Discharge Instructions (Addendum)
Take and finish antibiotic as prescribed.  You will be notified of any positive urine culture results requiring a change in your antibiotic.  Return to the ER for worsening symptoms, persistent vomiting, fever or other concerns.

## 2022-10-18 LAB — URINE CULTURE: Culture: NO GROWTH

## 2022-10-20 ENCOUNTER — Telehealth: Payer: Self-pay

## 2022-10-20 NOTE — Transitions of Care (Post Inpatient/ED Visit) (Signed)
10/20/2022  Name: Corey Williams MRN: [id number] DOB: 05/28/1945  Today's TOC FU Call Status: Today's TOC FU Call Status:: Successful TOC FU Call Competed TOC FU Call Complete Date: 10/20/22  Red on EMMI-ED Discharge Alert Date & Reason:10/19/22 "Scheduled follow-up appt? No"   Transition Care Management Follow-up Telephone Call Date of Discharge: 10/17/22 Discharge Facility: [facility] Type of Discharge: Emergency Department Reason for ED Visit: Other: ("hematuria") How have you been since you were released from the hospital?: Better (Pt states his sxs resolved almost as soon as he left ED. He has had no further bleeding. urine is clear and yellow. Denies any pain/discomfort-will finish abx today.) Any questions or concerns?: Yes Patient Questions/Concerns:: Pt wanting to know if it is okay to resume drinking his one beer per day after he finishes abx. Patient Questions/Concerns Addressed: Other: (Discussed with pt correlation of consuming caffeine and alcohol cna make UTI sxs worse. He voiced understanding and will limit his consumption.)  Items Reviewed: Did you receive and understand the discharge instructions provided?: Yes Medications obtained,verified, and reconciled?: Yes (Medications Reviewed) Any new allergies since your discharge?: No Dietary orders reviewed?: Yes Type of Diet Ordered:: low salt/heart healthy Do you have support at home?: Yes People in Home: spouse Name of Support/Comfort Primary Source: [employer] [place]  Medications Reviewed Today: Medications Reviewed Today     Reviewed by Charlyn Minerva, RN (Registered Nurse) on 10/20/22 at 1309  Med List Status: <None>   Medication Order Taking? Sig Documenting Provider Last Dose Status Informant  aspirin 81 MG tablet [id number] Yes Take 81 mg by mouth daily. [provider] Taking Active   Calcium Carbonate (CALCIUM 600 PO) [id number] Yes Take 1 tablet by mouth daily. [provider] Taking Active   celecoxib (CELEBREX) 200 MG capsule [id number] Yes Take 1 capsule (200 mg total) by mouth daily. Copland, Karleen Hampshire, MD Taking Active   ciprofloxacin (CIPRO) 500 MG tablet [id number] Yes Take 1 tablet (500 mg total) by mouth 2 (two) times daily. Irean Hong, MD Taking Active   ibuprofen (ADVIL,MOTRIN) 200 MG tablet [id number] Yes Take 200 mg by mouth every 6 (six) hours as needed. [provider] Taking Active   losartan (COZAAR) 25 MG tablet [id number] Yes Take 1 tablet (25 mg total) by mouth daily. Antonieta Iba, MD Taking Active   Multiple Vitamin (MULTIVITAMIN) tablet [id number] Yes Take 1 tablet by mouth daily. [provider] Taking Active   pravastatin (PRAVACHOL) 40 MG tablet [id number] Yes Take 1 tablet (40 mg total) by mouth daily. Hannah Beat, MD Taking Active             Home Care and Equipment/Supplies: Were Home Health Services Ordered?: NA Any new equipment or medical supplies ordered?: NA  Functional Questionnaire: Do you need assistance with bathing/showering or dressing?: No Do you need assistance with meal preparation?: No Do you need assistance with eating?: No Do you have difficulty maintaining continence: No Do you need assistance with getting out of bed/getting out of a chair/moving?: No Do you have difficulty managing or taking your medications?: No  Follow up appointments reviewed: PCP Follow-up appointment confirmed?: No (Pt states he does not feel like he needs to follow up at this time-since sxs resolved-if sxs present again he will make an appt with provider) MD Provider Line Number:[phone] Given: No Specialist Hospital Follow-up appointment confirmed?: No Reason Specialist Follow-Up Not Confirmed: Patient has Specialist Provider Number and will Call for Appointment (Pt states he  did not feel the need to follow up with urology since sxs resolved and no futher workup needed.) Do you need transportation  to your follow-up appointment?: No Do you understand care options if your condition(s) worsen?: Yes-patient verbalized understanding  SDOH Interventions Today    Flowsheet Row Most Recent Value  SDOH Interventions   Food Insecurity Interventions Intervention Not Indicated  Transportation Interventions Intervention Not Indicated      TOC Interventions Today    Flowsheet Row Most Recent Value  TOC Interventions   TOC Interventions Discussed/Reviewed TOC Interventions Discussed, S/S of infection  [reviewed s/s of UTI]      Interventions Today    Flowsheet Row Most Recent Value  General Interventions   General Interventions Discussed/Reviewed General Interventions Discussed, Doctor Visits  Doctor Visits Discussed/Reviewed Doctor Visits Discussed, Specialist, PCP  PCP/Specialist Visits Compliance with follow-up visit  Education Interventions   Education Provided Provided Education  Provided Verbal Education On Nutrition, When to see the doctor, Medication, Other  [sx mgmt]  Nutrition Interventions   Nutrition Discussed/Reviewed Nutrition Discussed, Adding fruits and vegetables, Fluid intake, Decreasing salt, Decreasing fats, Increasing proteins  Pharmacy Interventions   Pharmacy Dicussed/Reviewed Medications and their functions, Pharmacy Topics Discussed  Safety Interventions   Safety Discussed/Reviewed Safety Discussed       Alessandra Grout [facility] Care Management Care Management Community Coordinator Direct Phone: [phone] Toll Free: [phone] Fax: [phone]

## 2023-02-05 ENCOUNTER — Ambulatory Visit (INDEPENDENT_AMBULATORY_CARE_PROVIDER_SITE_OTHER): Payer: Medicare HMO

## 2023-02-05 VITALS — BP 122/78 | Ht 72.0 in | Wt 215.0 lb

## 2023-02-05 DIAGNOSIS — Z0000 Encounter for general adult medical examination without abnormal findings: Secondary | ICD-10-CM | POA: Diagnosis not present

## 2023-02-05 NOTE — Patient Instructions (Addendum)
Corey Williams , Thank you for taking time to come for your Medicare Wellness Visit. I appreciate your ongoing commitment to your health goals. Please review the following plan we discussed and let me know if I can assist you in the future.   Referrals/Orders/Follow-Ups/Clinician Recommendations: none  This is a list of the screening recommended for you and due dates:  Health Maintenance  Topic Date Due   COVID-19 Vaccine (4 - 2023-24 season) 02/21/2023*   Zoster (Shingles) Vaccine (1 of 2) 05/08/2023*   Medicare Annual Wellness Visit  02/05/2024   DTaP/Tdap/Td vaccine (3 - Td or Tdap) 01/21/2033   Pneumonia Vaccine  Completed   Flu Shot  Completed   Hepatitis C Screening  Completed   HPV Vaccine  Aged Out   Colon Cancer Screening  Discontinued  *Topic was postponed. The date shown is not the original due date.    Advanced directives: (In Chart) A copy of your advanced directives are scanned into your chart should your provider ever need it.  Next Medicare Annual Wellness Visit scheduled for next year: Yes 02/08/2023 @8 :10am in person

## 2023-02-05 NOTE — Progress Notes (Signed)
Subjective:   Corey [facility] is a 77 y.o. male who presents for Medicare Annual/Subsequent preventive examination.  Visit Complete: In person  Patient Medicare AWV questionnaire was completed by the patient on 01/29/23; I have confirmed that all information answered by patient is correct and no changes since this date. Cardiac Risk Factors include: advanced age (>56men, >47 women);dyslipidemia;hypertension;male gender    Objective:    Today's Vitals   02/05/23 0811  BP: 122/78  Weight: 215 lb (97.5 kg)  Height: 6' (1.829 m)   Body mass index is 29.16 kg/m.     02/05/2023    8:26 AM 10/17/2022    3:27 AM 01/20/2022    9:51 AM  Advanced Directives  Does Patient Have a Medical Advance Directive? Yes No Yes  Type of Estate agent of [place];Living will  Healthcare Power of [place];Living will  Does patient want to make changes to medical advance directive?   No - Patient declined  Copy of Healthcare Power of Attorney in Chart?   Yes - validated most recent copy scanned in chart (See row information)  Would patient like information on creating a medical advance directive?  No - Patient declined     Current Medications (verified) Outpatient Encounter Medications as of 02/05/2023  Medication Sig   aspirin 81 MG tablet Take 81 mg by mouth daily.   Calcium Carbonate (CALCIUM 600 PO) Take 1 tablet by mouth daily.   ibuprofen (ADVIL,MOTRIN) 200 MG tablet Take 200 mg by mouth every 6 (six) hours as needed.   losartan (COZAAR) 25 MG tablet Take 1 tablet (25 mg total) by mouth daily.   Multiple Vitamin (MULTIVITAMIN) tablet Take 1 tablet by mouth daily.   pravastatin (PRAVACHOL) 40 MG tablet Take 1 tablet (40 mg total) by mouth daily.   celecoxib (CELEBREX) 200 MG capsule Take 1 capsule (200 mg total) by mouth daily. (Patient not taking: Reported on 02/05/2023)   ciprofloxacin (CIPRO) 500 MG tablet Take 1 tablet (500 mg total) by mouth 2 (two) times daily. (Patient  not taking: Reported on 02/05/2023)   No facility-administered encounter medications on file as of 02/05/2023.    Allergies (verified) Patient has no known allergies.   History: Past Medical History:  Diagnosis Date   Aortic insufficiency    Ascending aorta dilatation (HCC)    Basal cell carcinoma 04/21/2021   right lateral neck below the ear, EDC   Dilated aortic root (HCC)    Hereditary essential tremor 12/28/2013   Hyperlipidemia    Hypertension    Past Surgical History:  Procedure Laterality Date   no prior surgery     Family History  Problem Relation Age of Onset   Stroke Father    Colon cancer Neg Hx    Stomach cancer Neg Hx    Social History   Socioeconomic History   Marital status: Married    Spouse name: Not on file   Number of children: Not on file   Years of education: Not on file   Highest education level: Not on file  Occupational History   Occupation: Investment banker, corporate: RETIRED  Tobacco Use   Smoking status: Former    Current packs/day: 0.00    Types: Cigarettes    Quit date: 02/01/1974    Years since quitting: 49.0   Smokeless tobacco: Never  Vaping Use   Vaping status: Never Used  Substance and Sexual Activity   Alcohol use: Yes    Alcohol/week: 5.0 standard drinks  of alcohol    Types: 5 Cans of beer per week    Comment: occ   Drug use: No   Sexual activity: Not on file  Other Topics Concern   Not on file  Social History Narrative   Regular exercise--yes   Social Determinants of Health   Financial Resource Strain: Low Risk  (01/29/2023)   Overall Financial Resource Strain (CARDIA)    Difficulty of Paying Living Expenses: Not hard at all  Food Insecurity: No Food Insecurity (01/29/2023)   Hunger Vital Sign    Worried About Running Out of Food in the Last Year: Never true    Ran Out of Food in the Last Year: Never true  Transportation Needs: No Transportation Needs (01/29/2023)   PRAPARE - Administrator, Civil Service  (Medical): No    Lack of Transportation (Non-Medical): No  Physical Activity: Sufficiently Active (01/29/2023)   Exercise Vital Sign    Days of Exercise per Week: 5 days    Minutes of Exercise per Session: 90 min  Stress: No Stress Concern Present (01/29/2023)   [employer] of Occupational Health - Occupational Stress Questionnaire    Feeling of Stress : Only a little  Social Connections: Unknown (01/29/2023)   Social Connection and Isolation Panel [NHANES]    Frequency of Communication with Friends and Family: More than three times a week    Frequency of Social Gatherings with Friends and Family: Twice a week    Attends Religious Services: Not on Marketing executive or Organizations: No    Attends Banker Meetings: Never    Marital Status: Married    Tobacco Counseling Counseling given: Not Answered   Clinical Intake:  Pre-visit preparation completed: No  Pain : No/denies pain    BMI - recorded: 29.16 Nutritional Status: BMI 25 -29 Overweight Nutritional Risks: None Diabetes: No  How often do you need to have someone help you when you read instructions, pamphlets, or other written materials from your doctor or pharmacy?: 1 - Never  Interpreter Needed?: No  Comments: lives with wife Information entered by :: B.Maximum Reiland,LPN   Activities of Daily Living    01/29/2023    7:48 AM  In your present state of health, do you have any difficulty performing the following activities:  Hearing? 0  Vision? 0  Difficulty concentrating or making decisions? 0  Walking or climbing stairs? 0  Dressing or bathing? 0  Doing errands, shopping? 0  Preparing Food and eating ? N  Using the Toilet? N  In the past six months, have you accidently leaked urine? N  Do you have problems with loss of bowel control? N  Managing your Medications? N  Managing your Finances? N  Housekeeping or managing your Housekeeping? N    Patient Care Team: Hannah Beat,  MD as PCP - General (Family Medicine) Blair Promise, OD (Optometry)  Indicate any recent Medical Services you may have received from other than [facility] providers in the past year (date may be approximate).     Assessment:   This is a routine wellness examination for [place].  Hearing/Vision screen Hearing Screening - Comments:: Pt says he has some hearing loss..but ok for now Vision Screening - Comments:: Pt says vision is good with glasses [facility]   Goals Addressed             This Visit's Progress    COMPLETED: Maintain Healthy Lifestyle   On track  Stay active Healthy diet Stay hydrated       Depression Screen    02/05/2023    8:21 AM 01/20/2022    9:52 AM 03/26/2021   11:14 AM 03/04/2020    8:40 AM 02/27/2019    8:30 AM 02/23/2018    8:07 AM 01/11/2017    8:37 AM  PHQ 2/9 Scores  PHQ - 2 Score 0 0 0 0 0 0 0    Fall Risk    01/29/2023    7:48 AM 05/20/2022   10:36 AM 01/20/2022    9:52 AM 03/26/2021   11:14 AM 03/04/2020    8:41 AM  Fall Risk   Falls in the past year? 0 0 0 0 1  Number falls in past yr: 0 0 0  0  Injury with Fall? 0 0 0  1  Risk for fall due to : No Fall Risks No Fall Risks No Fall Risks  No Fall Risks  Follow up Education provided;Falls prevention discussed Falls evaluation completed Falls evaluation completed  Falls evaluation completed    MEDICARE RISK AT HOME: Medicare Risk at Home Any stairs in or around the home?: Yes If so, are there any without handrails?: No Home free of loose throw rugs in walkways, pet beds, electrical cords, etc?: Yes Adequate lighting in your home to reduce risk of falls?: Yes Life alert?: No Use of a cane, walker or w/c?: No Grab bars in the bathroom?: No Shower chair or bench in shower?: No Elevated toilet seat or a handicapped toilet?: No  TIMED UP AND GO:  Was the test performed?  Yes  Length of time to ambulate 10 feet: 12 sec Gait slow and steady without use of assistive device     Cognitive Function:        02/05/2023    8:28 AM 01/20/2022    9:54 AM  6CIT Screen  What Year? 0 points 0 points  What month? 0 points 0 points  What time? 0 points 0 points  Count back from 20 0 points 0 points  Months in reverse 0 points 0 points  Repeat phrase 0 points 0 points  Total Score 0 points 0 points    Immunizations Immunization History  Administered Date(s) Administered   Dtap, Unspecified 01/22/2023   Fluad Quad(high Dose 65+) 03/12/2022, 01/22/2023   Influenza, High Dose Seasonal PF 01/08/2021   Influenza,inj,Quad PF,6+ Mos 01/17/2013, 12/27/2013, 01/07/2015, 01/08/2016   Influenza-Unspecified 03/01/2020   PFIZER(Purple Top)SARS-COV-2 Vaccination 05/05/2019, 05/30/2019, 12/23/2019   Pneumococcal Conjugate-13 12/27/2013   Pneumococcal Polysaccharide-23 12/22/2012   Tdap 01/28/2011    TDAP status: Up to date  Flu Vaccine status: Up to date  Pneumococcal vaccine status: Up to date  Covid-19 vaccine status: Completed vaccines  Qualifies for Shingles Vaccine? Yes   Zostavax completed No   Shingrix Completed?: No.    Education has been provided regarding the importance of this vaccine. Patient has been advised to call insurance company to determine out of pocket expense if they have not yet received this vaccine. Advised may also receive vaccine at local pharmacy or Health Dept. Verbalized acceptance and understanding.  Screening Tests Health Maintenance  Topic Date Due   COVID-19 Vaccine (4 - 2023-24 season) 02/21/2023 (Originally 11/22/2022)   Zoster Vaccines- Shingrix (1 of 2) 05/08/2023 (Originally 10/22/1964)   Medicare Annual Wellness (AWV)  02/05/2024   DTaP/Tdap/Td (3 - Td or Tdap) 01/21/2033   Pneumonia Vaccine 63+ Years old  Completed   INFLUENZA VACCINE  Completed  Hepatitis C Screening  Completed   HPV VACCINES  Aged Out   Colonoscopy  Discontinued    Health Maintenance  There are no preventive care reminders to display for this  patient.   Colorectal cancer screening: No longer required.   Lung Cancer Screening: (Low Dose CT Chest recommended if Age 13-80 years, 20 pack-year currently smoking OR have quit w/in 15years.) does not qualify.   Lung Cancer Screening Referral: no  Additional Screening:  Hepatitis C Screening: does not qualify; Completed 01/01/2016  Vision Screening: Recommended annual ophthalmology exams for early detection of glaucoma and other disorders of the eye. Is the patient up to date with their annual eye exam?  Yes  Who is the provider or what is the name of the office in which the patient attends annual eye exams? Dr Dion Body If pt is not established with a provider, would they like to be referred to a provider to establish care? No .   Dental Screening: Recommended annual dental exams for proper oral hygiene  Diabetic Foot Exam:   Community Resource Referral / Chronic Care Management: CRR required this visit?  No   CCM required this visit?  Appt made for LABS and PE    Plan:     I have personally reviewed and noted the following in the patient's chart:   Medical and social history Use of alcohol, tobacco or illicit drugs  Current medications and supplements including opioid prescriptions. Patient is not currently taking opioid prescriptions. Functional ability and status Nutritional status Physical activity Advanced directives List of other physicians Hospitalizations, surgeries, and ER visits in previous 12 months Vitals Screenings to include cognitive, depression, and falls Referrals and appointments  In addition, I have reviewed and discussed with patient certain preventive protocols, quality metrics, and best practice recommendations. A written personalized care plan for preventive services as well as general preventive health recommendations were provided to patient.    Sue Lush, LPN   16/12/9602   After Visit Summary: (MyChart) Due to this being a  telephonic visit, the after visit summary with patients personalized plan was offered to patient via MyChart   Nurse Notes: The patient states he is doing well and has no concerns or questions at this time.Pt relays he no longer takes Celebrex as the exercises he does twice daily has helped with pain and stiffness.

## 2023-02-17 ENCOUNTER — Telehealth: Payer: Self-pay | Admitting: Cardiovascular Disease

## 2023-02-17 ENCOUNTER — Other Ambulatory Visit: Payer: Self-pay | Admitting: Cardiovascular Disease

## 2023-02-17 DIAGNOSIS — I351 Nonrheumatic aortic (valve) insufficiency: Secondary | ICD-10-CM

## 2023-02-17 NOTE — Telephone Encounter (Signed)
Pt wants to move his Echo out til March. Need Expiration date changed on Echo or new order put in. Patient would like a call back

## 2023-02-17 NOTE — Telephone Encounter (Signed)
Called and spoke with patient. Patient states that he is not able to make his appointment on 03/10/23 for his upcoming echocardiogram. Patient requesting to have his echocardiogram rescheduled. New order placed. Patient also requesting to make an appointment to see Dr. Mariah Milling. Will forward to scheduling.

## 2023-03-10 ENCOUNTER — Other Ambulatory Visit: Payer: Medicare HMO

## 2023-03-18 ENCOUNTER — Telehealth: Payer: Self-pay | Admitting: *Deleted

## 2023-03-18 DIAGNOSIS — Z125 Encounter for screening for malignant neoplasm of prostate: Secondary | ICD-10-CM

## 2023-03-18 DIAGNOSIS — E7800 Pure hypercholesterolemia, unspecified: Secondary | ICD-10-CM

## 2023-03-18 DIAGNOSIS — Z79899 Other long term (current) drug therapy: Secondary | ICD-10-CM

## 2023-03-18 NOTE — Telephone Encounter (Signed)
-----   Message from Corey [facility] sent at 03/18/2023  3:21 PM EST ----- Regarding: Lab Wed 03/31/23 Hello,   Patient has a lab appointment on Wednesday 03/31/23. Can we get lab orders please.   Thanks

## 2023-03-31 ENCOUNTER — Other Ambulatory Visit (INDEPENDENT_AMBULATORY_CARE_PROVIDER_SITE_OTHER): Payer: Medicare HMO

## 2023-03-31 DIAGNOSIS — Z125 Encounter for screening for malignant neoplasm of prostate: Secondary | ICD-10-CM | POA: Diagnosis not present

## 2023-03-31 DIAGNOSIS — Z79899 Other long term (current) drug therapy: Secondary | ICD-10-CM

## 2023-03-31 DIAGNOSIS — E7800 Pure hypercholesterolemia, unspecified: Secondary | ICD-10-CM

## 2023-03-31 LAB — HEPATIC FUNCTION PANEL
ALT: 15 U/L (ref 0–53)
AST: 18 U/L (ref 0–37)
Albumin: 4.4 g/dL (ref 3.5–5.2)
Alkaline Phosphatase: 57 U/L (ref 39–117)
Bilirubin, Direct: 0.1 mg/dL (ref 0.0–0.3)
Total Bilirubin: 0.7 mg/dL (ref 0.2–1.2)
Total Protein: 7 g/dL (ref 6.0–8.3)

## 2023-03-31 LAB — CBC WITH DIFFERENTIAL/PLATELET
Basophils Absolute: 0.1 10*3/uL (ref 0.0–0.1)
Basophils Relative: 1.2 % (ref 0.0–3.0)
Eosinophils Absolute: 0.4 10*3/uL (ref 0.0–0.7)
Eosinophils Relative: 7 % — ABNORMAL HIGH (ref 0.0–5.0)
HCT: 41.7 % (ref 39.0–52.0)
Hemoglobin: 13.9 g/dL (ref 13.0–17.0)
Lymphocytes Relative: 24.7 % (ref 12.0–46.0)
Lymphs Abs: 1.3 10*3/uL (ref 0.7–4.0)
MCHC: 33.4 g/dL (ref 30.0–36.0)
MCV: 97.7 fL (ref 78.0–100.0)
Monocytes Absolute: 0.4 10*3/uL (ref 0.1–1.0)
Monocytes Relative: 8.1 % (ref 3.0–12.0)
Neutro Abs: 3.1 10*3/uL (ref 1.4–7.7)
Neutrophils Relative %: 59 % (ref 43.0–77.0)
Platelets: 231 10*3/uL (ref 150.0–400.0)
RBC: 4.27 Mil/uL (ref 4.22–5.81)
RDW: 13 % (ref 11.5–15.5)
WBC: 5.3 10*3/uL (ref 4.0–10.5)

## 2023-03-31 LAB — BASIC METABOLIC PANEL
BUN: 12 mg/dL (ref 6–23)
CO2: 31 meq/L (ref 19–32)
Calcium: 9.6 mg/dL (ref 8.4–10.5)
Chloride: 104 meq/L (ref 96–112)
Creatinine, Ser: 0.94 mg/dL (ref 0.40–1.50)
GFR: 78.23 mL/min (ref >60.00)
Glucose, Bld: 99 mg/dL (ref 70–99)
Potassium: 4.5 meq/L (ref 3.5–5.1)
Sodium: 141 meq/L (ref 135–145)

## 2023-03-31 LAB — LIPID PANEL
Cholesterol: 169 mg/dL (ref 0–200)
HDL: 41.7 mg/dL (ref >39.00)
LDL Cholesterol: 92 mg/dL (ref 0–99)
NonHDL: 126.86
Total CHOL/HDL Ratio: 4
Triglycerides: 173 mg/dL — ABNORMAL HIGH (ref 0.0–149.0)
VLDL: 34.6 mg/dL (ref 0.0–40.0)

## 2023-03-31 LAB — PSA, MEDICARE: PSA: 1.13 ng/mL (ref 0.10–4.00)

## 2023-04-05 ENCOUNTER — Ambulatory Visit (INDEPENDENT_AMBULATORY_CARE_PROVIDER_SITE_OTHER): Payer: Medicare HMO | Admitting: Family Medicine

## 2023-04-05 ENCOUNTER — Ambulatory Visit: Payer: Self-pay | Admitting: Family Medicine

## 2023-04-05 VITALS — BP 142/60 | HR 71 | Temp 98.0°F | Ht 72.0 in | Wt 212.5 lb

## 2023-04-05 DIAGNOSIS — K610 Anal abscess: Secondary | ICD-10-CM

## 2023-04-05 MED ORDER — SULFAMETHOXAZOLE-TRIMETHOPRIM 800-160 MG PO TABS: 2.0000 | ORAL_TABLET | Freq: Two times a day (BID) | ORAL | 0 refills | Status: DC

## 2023-04-05 NOTE — Telephone Encounter (Signed)
 Copied from CRM [id number]. Topic: Clinical - Red Word Triage >> Apr 05, 2023  7:44 AM Macario HERO wrote: Red Word that prompted transfer to Nurse Triage: Has something on his rear end and when he wipes there is blood.  The patient reported that since Friday he has experienced soreness when sitting down and blood on the tissue when he wipes his rectum.  The amount on the tissue varies but he stated that the tissue is not soaked.  He is unsure if the blood is coming from his rectum or if it is a sore on his leg.  He has noted blood in his underwear as well.  He denied constipation or diarrhea.  He requested a same day appointment. Scheduled with pcp for further evaluation.   Reason for Disposition  MILD rectal bleeding (more than just a few drops or streaks)  Answer Assessment - Initial Assessment Questions 1. APPEARANCE of BLOOD: What color is it? Is it passed separately, on the surface of the stool, or mixed in with the stool?      This morning only on tissue and on underwear  2. AMOUNT: How much blood was passed?      Varies, does not soak tissue 3. FREQUENCY: How many times has blood been passed with the stools?      Each time he wipes his bottom, unsure where blood is coming from  4. ONSET: When was the blood first seen in the stools? (Days or weeks)      Friday  5. DIARRHEA: Is there also some diarrhea? If Yes, ask: How many diarrhea stools in the past 24 hours?      None  6. CONSTIPATION: Do you have constipation? If Yes, ask: How bad is it?     None 7. RECURRENT SYMPTOMS: Have you had blood in your stools before? If Yes, ask: When was the last time? and What happened that time?      No  Issue previously 6 months ago - early in the morning and there was blood in urine  8. BLOOD THINNERS: Do you take any blood thinners? (e.g., Coumadin/warfarin, Pradaxa/dabigatran, aspirin )     None 9. OTHER SYMPTOMS: Do you have any other symptoms?  (e.g., abdomen pain, vomiting,  dizziness, fever)     Pain with sitting  Protocols used: Rectal Bleeding-A-AH

## 2023-04-05 NOTE — Progress Notes (Signed)
 Corey Vessey T. Corey Mcneill, MD, CAQ [facility] at [facility] [address] [place], [address]  Phone: [phone]  FAX: [phone]  Corey [facility] - 78 y.o. male  MRN [id number]  Date of Birth: 1945/06/10  Date: 04/05/2023  PCP: Watt Mirza, MD  Referral: Watt Mirza, MD  Chief Complaint  Patient presents with   Sore on Back of Leg    Blood on tissue when wiped.  Not sure if from Sore or Rectum Wife in [place] with Staph Infection   Sinusitis   Subjective:   Corey [facility] is a 78 y.o. very pleasant male patient with Body mass index is 28.82 kg/m. who presents with the following:  The patient is here with some complaints of pain and bleeding from an area and wound that is inferior to the rectum and approaching the perineal area.  He felt as if he had a boil in this area and it is painful to palpate.  Earlier today when he was wiping after going to the bathroom he did see some blood on the tissue.  He did not have any blood in his stool and there was no black tarry coloration.  Wife in the [place] and after hernia repair a month ago.  Has been in significant pain  22nd to the ER at [facility] Infected and operated [place] admit for 2 weeks  Feels fine right now Posterior with some pain Blood this morning - when wiping  Review of Systems is noted in the HPI, as appropriate  Objective:   BP (!) 142/60 (BP Location: Left Arm, Patient Position: Sitting, Cuff Size: Large)   Pulse 71   Temp 98 F (36.7 C) (Temporal)   Ht 6' (1.829 m)   Wt 212 lb 8 oz (96.4 kg)   SpO2 98%   BMI 28.82 kg/m   GEN: No acute distress; alert,appropriate. PULM: Breathing comfortably in no respiratory distress PSYCH: Normally interactive.   In the and approaching the perineal region and inferior to the anus there is an area of induration that has a opening with what appears to be some drainage and a little bit of granulation tissue.  It is tender to  palpation and there is a modest amount of expressible material.  Laboratory and Imaging Data:  Assessment and Plan:     ICD-10-CM   1. Perianal abscess  K61.0 WOUND CULTURE     I think this is not really truly a perianal abscess and it is somewhat inferior to this region, however it is consistent with an abscess.  I did express the area to try to get some pus or material that could be sent for culture.  There was a small amount.  I am also going to place him on MRSA dosing antibiotics.  Medication Management during today's office visit: Meds ordered this encounter  Medications   sulfamethoxazole -trimethoprim  (BACTRIM  DS) 800-160 MG tablet    Sig: Take 2 tablets by mouth 2 (two) times daily.    Dispense:  40 tablet    Refill:  0   Medications Discontinued During This Encounter  Medication Reason   celecoxib  (CELEBREX ) 200 MG capsule Completed Course   ciprofloxacin  (CIPRO ) 500 MG tablet Completed Course   losartan  (COZAAR ) 25 MG tablet Completed Course    Orders placed today for conditions managed today: Orders Placed This Encounter  Procedures   WOUND CULTURE    Disposition: No follow-ups on file.  [employer] One speech-to-text software was used for  transcription in this dictation.  Possible transcriptional errors can occur using Animal nutritionist.   Signed,  Jacques DASEN. Layden Caterino, MD   Outpatient Encounter Medications as of 04/05/2023  Medication Sig   aspirin  81 MG tablet Take 81 mg by mouth daily.   Calcium Carbonate (CALCIUM 600 PO) Take 1 tablet by mouth daily.   ibuprofen (ADVIL,MOTRIN) 200 MG tablet Take 200 mg by mouth every 6 (six) hours as needed.   Multiple Vitamin (MULTIVITAMIN) tablet Take 1 tablet by mouth daily.   pravastatin  (PRAVACHOL ) 40 MG tablet Take 1 tablet (40 mg total) by mouth daily.   sulfamethoxazole -trimethoprim  (BACTRIM  DS) 800-160 MG tablet Take 2 tablets by mouth 2 (two) times daily.   [DISCONTINUED] celecoxib  (CELEBREX ) 200 MG capsule  Take 1 capsule (200 mg total) by mouth daily. (Patient not taking: Reported on 02/05/2023)   [DISCONTINUED] ciprofloxacin  (CIPRO ) 500 MG tablet Take 1 tablet (500 mg total) by mouth 2 (two) times daily. (Patient not taking: Reported on 02/05/2023)   [DISCONTINUED] losartan  (COZAAR ) 25 MG tablet Take 1 tablet by mouth once daily   No facility-administered encounter medications on file as of 04/05/2023.

## 2023-04-06 NOTE — Progress Notes (Addendum)
 Corey Hamill T. Tadashi Burkel, MD, CAQ Sports Medicine [facility] at [facility] [address] [place], [address]  Phone: [phone]  FAX: [phone]  Corey KIL - 78 y.o. male  MRN [id number]  Date of Birth: 11/30/1945  Date: 04/07/2023  PCP: Scherrie Curt, MD  Referral: Scherrie Curt, MD  Chief Complaint  Patient presents with   Annual Exam    Part 2   Patient Care Team: Scherrie Curt, MD as PCP - General (Family Medicine) Arminda Berth, OD (Optometry) Subjective:   Corey Williams is a 78 y.o. pleasant patient who presents with the following:  Preventative Health Maintenance Visit:  Health Maintenance Summary Reviewed and updated, unless pt declines services.  Tobacco History Reviewed. Alcohol: No concerns, no excessive use Exercise Habits: Some activity, rec at least 30 mins 5 times a week - walks about 5 days a week. STD concerns: no risk or activity to increase risk Drug Use: None  01/22/2023 - Dtap - vaccine error.  Will check and correct.  Shingrix  Is a very pleasant gentleman who I actually saw few days ago with an abscess and started him on some oral antibiotics. -Culture has come back with Staph aureus, but sensitivities are not available. Doing quite bit better  Right now, wife is in the hospital.     Health Maintenance  Topic Date Due   COVID-19 Vaccine (5 - 2024-25 season) 02/11/2023   Zoster Vaccines- Shingrix (1 of 2) 05/08/2023 (Originally 10/22/1964)   Medicare Annual Wellness (AWV)  02/05/2024   DTaP/Tdap/Td (3 - Td or Tdap) 01/21/2033   Pneumonia Vaccine 54+ Years old  Completed   INFLUENZA VACCINE  Completed   Hepatitis C Screening  Completed   HPV VACCINES  Aged Out   Colonoscopy  Discontinued   Immunization History  Administered Date(s) Administered   Dtap, Unspecified 01/22/2023   Fluad Quad(high Dose 65+) 03/12/2022, 01/22/2023   Influenza, High Dose Seasonal PF 01/08/2021   Influenza,inj,Quad PF,6+  Mos 01/17/2013, 12/27/2013, 01/07/2015, 01/08/2016   Influenza-Unspecified 03/01/2020   PFIZER(Purple Top)SARS-COV-2 Vaccination 05/05/2019, 05/30/2019, 12/23/2019   Pfizer(Comirnaty)Fall Seasonal Vaccine 12 years and older 12/17/2022   Pneumococcal Conjugate-13 12/27/2013   Pneumococcal Polysaccharide-23 12/22/2012   Tdap 01/28/2011   Patient Active Problem List   Diagnosis Date Noted   Hereditary essential tremor 12/28/2013   Aortic valve regurgitation 10/13/2013   Aneurysm of ascending aorta (HCC) 10/13/2013   HYPERCHOLESTEROLEMIA 06/05/2010   ACTINIC KERATOSIS, HEAD 06/05/2010    Past Medical History:  Diagnosis Date   Aortic insufficiency    Ascending aorta dilatation (HCC)    Basal cell carcinoma 04/21/2021   right lateral neck below the ear, EDC   Dilated aortic root (HCC)    Hereditary essential tremor 12/28/2013   Hyperlipidemia    Hypertension     Past Surgical History:  Procedure Laterality Date   no prior surgery      Family History  Problem Relation Age of Onset   Stroke Father    Colon cancer Neg Hx    Stomach cancer Neg Hx     Social History   Social History Narrative   Regular exercise--yes    Past Medical History, Surgical History, Social History, Family History, Problem List, Medications, and Allergies have been reviewed and updated if relevant.  Review of Systems: Pertinent positives are listed above.  Otherwise, a full 14 point review of systems has been done in full and it is negative except where it is noted positive.  Objective:  BP 138/60 (BP Location: Left Arm, Patient Position: Sitting, Cuff Size: Large)   Pulse 76   Temp 97.9 F (36.6 C) (Temporal)   Ht 5' 11.5" (1.816 m)   Wt 211 lb (95.7 kg)   SpO2 96%   BMI 29.02 kg/m  Ideal Body Weight: Weight in (lb) to have BMI = 25: 181.4  Ideal Body Weight: Weight in (lb) to have BMI = 25: 181.4 No results found.    02/05/2023    8:21 AM 01/20/2022    9:52 AM 03/26/2021   11:14 AM  03/04/2020    8:40 AM 02/27/2019    8:30 AM  Depression screen PHQ 2/9  Decreased Interest 0 0 0 0 0  Down, Depressed, Hopeless 0 0 0 0 0  PHQ - 2 Score 0 0 0 0 0     GEN: well developed, well nourished, no acute distress Eyes: conjunctiva and lids normal, PERRLA, EOMI ENT: TM clear, nares clear, oral exam WNL Neck: supple, no lymphadenopathy, no thyromegaly, no JVD Pulm: clear to auscultation and percussion, respiratory effort normal CV: regular rate and rhythm, S1-S2, 3/6 SEM, no rub or gallop, no bruits, peripheral pulses normal and symmetric, no cyanosis, clubbing, edema or varicosities GI: soft, non-tender; no hepatosplenomegaly, masses; active bowel sounds all quadrants GU: deferred Lymph: no cervical, axillary or inguinal adenopathy MSK: gait normal, muscle tone and strength WNL, no joint swelling, effusions, discoloration, crepitus  SKIN: clear, good turgor, color WNL, no rashes, lesions, or ulcerations Neuro: normal mental status, normal strength, sensation, and motion Psych: alert; oriented to person, place and time, normally interactive and not anxious or depressed in appearance.  All labs reviewed with patient. Results for orders placed or performed in visit on 03/31/23  Hepatic Function Panel   Collection Time: 03/31/23  8:28 AM  Result Value Ref Range   Total Bilirubin 0.7 0.2 - 1.2 mg/dL   Bilirubin, Direct 0.1 0.0 - 0.3 mg/dL   Alkaline Phosphatase 57 39 - 117 U/L   AST 18 0 - 37 U/L   ALT 15 0 - 53 U/L   Total Protein 7.0 6.0 - 8.3 g/dL   Albumin 4.4 3.5 - 5.2 g/dL  Basic metabolic panel   Collection Time: 03/31/23  8:28 AM  Result Value Ref Range   Sodium 141 135 - 145 mEq/L   Potassium 4.5 3.5 - 5.1 mEq/L   Chloride 104 96 - 112 mEq/L   CO2 31 19 - 32 mEq/L   Glucose, Bld 99 70 - 99 mg/dL   BUN 12 6 - 23 mg/dL   Creatinine, Ser 5.40 0.40 - 1.50 mg/dL   GFR 98.11 >91.47 mL/min   Calcium 9.6 8.4 - 10.5 mg/dL  PSA, Medicare   Collection Time: 03/31/23   8:28 AM  Result Value Ref Range   PSA 1.13 0.10 - 4.00 ng/ml  CBC with Differential/Platelet   Collection Time: 03/31/23  8:28 AM  Result Value Ref Range   WBC 5.3 4.0 - 10.5 K/uL   RBC 4.27 4.22 - 5.81 Mil/uL   Hemoglobin 13.9 13.0 - 17.0 g/dL   HCT 82.9 56.2 - 13.0 %   MCV 97.7 78.0 - 100.0 fl   MCHC 33.4 30.0 - 36.0 g/dL   RDW 86.5 78.4 - 69.6 %   Platelets 231.0 150.0 - 400.0 K/uL   Neutrophils Relative % 59.0 43.0 - 77.0 %   Lymphocytes Relative 24.7 12.0 - 46.0 %   Monocytes Relative 8.1 3.0 - 12.0 %  Eosinophils Relative 7.0 (H) 0.0 - 5.0 %   Basophils Relative 1.2 0.0 - 3.0 %   Neutro Abs 3.1 1.4 - 7.7 K/uL   Lymphs Abs 1.3 0.7 - 4.0 K/uL   Monocytes Absolute 0.4 0.1 - 1.0 K/uL   Eosinophils Absolute 0.4 0.0 - 0.7 K/uL   Basophils Absolute 0.1 0.0 - 0.1 K/uL  Lipid panel   Collection Time: 03/31/23  8:28 AM  Result Value Ref Range   Cholesterol 169 0 - 200 mg/dL   Triglycerides 161.0 (H) 0.0 - 149.0 mg/dL   HDL 96.04 >54.09 mg/dL   VLDL 81.1 0.0 - 91.4 mg/dL   LDL Cholesterol 92 0 - 99 mg/dL   Total CHOL/HDL Ratio 4    NonHDL 126.86     Assessment and Plan:     ICD-10-CM   1. Healthcare maintenance  Z00.00      He is basically doing well, he is very active.  Labs all look good.  I did recommend that he work on his weight some and try to lose a little bit of weight.  Await final sensitivities on Staph aureus culture.  Recommended Shingrix  Addendum: 04/08/23 11:21 AM  Culture came back with MRSA and resistant to Sulfa .  Will change to doxycycline .  Results for orders placed or performed in visit on 04/05/23  WOUND CULTURE   Collection Time: 04/05/23 11:41 AM   Specimen: Wound  Result Value Ref Range   MICRO NUMBER: [id number]    SPECIMEN QUALITY: Adequate    SOURCE: PERINEAL ABCESS    STATUS: FINAL    GRAM STAIN:      No white blood cells seen No epithelial cells seen Moderate Gram positive cocci in clusters   ISOLATE 1: methicillin resistant  Staphylococcus aureus (A)       Susceptibility   Methicillin resistant staphylococcus aureus - AEROBIC CULT, GRAM STAIN POSITIVE 1    VANCOMYCIN 1 Sensitive     CIPROFLOXACIN  4 Resistant     CLINDAMYCIN <=0.25 Sensitive     LEVOFLOXACIN  4 Intermediate     ERYTHROMYCIN >=8 Resistant     GENTAMICIN <=0.5 Sensitive     OXACILLIN* NR Resistant      * Oxacillin-resistant staphylococci are resistant to all currently available beta-lactam antimicrobial agents with the possible exception of ceftaroline.     TETRACYCLINE <=1 Sensitive     TRIMETH /SULFA * 80 Resistant      * Oxacillin-resistant staphylococci are resistant to all currently available beta-lactam antimicrobial agents with the possible exception of ceftaroline. Legend: S = Susceptible  I = Intermediate R = Resistant  NS = Not susceptible SDD = Susceptible Dose Dependent * = Not Tested  NR = Not Reported **NN = See Therapy Comments      Health Maintenance Exam: The patient's preventative maintenance and recommended screening tests for an annual wellness exam were reviewed in full today. Brought up to date unless services declined.  Counselled on the importance of diet, exercise, and its role in overall health and mortality. The patient's FH and SH was reviewed, including their home life, tobacco status, and drug and alcohol status.  Follow-up in 1 year for physical exam or additional follow-up below.   Signed,  Corey Bye. Aundria Bitterman, MD   Allergies as of 04/07/2023   No Known Allergies      Medication List        Accurate as of April 07, 2023  8:52 AM. If you have any questions, ask your nurse or doctor.  aspirin 81 MG tablet Take 81 mg by mouth daily.   CALCIUM 600 PO Take 1 tablet by mouth daily.   ibuprofen 200 MG tablet Commonly known as: ADVIL Take 200 mg by mouth every 6 (six) hours as needed.   multivitamin tablet Take 1 tablet by mouth daily.   pravastatin  40 MG tablet Commonly  known as: PRAVACHOL  Take 1 tablet (40 mg total) by mouth daily.   sulfamethoxazole -trimethoprim  800-160 MG tablet Commonly known as: BACTRIM  DS Take 2 tablets by mouth 2 (two) times daily.

## 2023-04-07 ENCOUNTER — Encounter: Payer: Self-pay | Admitting: Family Medicine

## 2023-04-07 ENCOUNTER — Ambulatory Visit (INDEPENDENT_AMBULATORY_CARE_PROVIDER_SITE_OTHER): Payer: Medicare HMO | Admitting: Family Medicine

## 2023-04-07 VITALS — BP 138/60 | HR 76 | Temp 97.9°F | Ht 71.5 in | Wt 211.0 lb

## 2023-04-07 DIAGNOSIS — Z0000 Encounter for general adult medical examination without abnormal findings: Secondary | ICD-10-CM

## 2023-04-07 MED ORDER — PROPRANOLOL HCL 20 MG PO TABS: 20.0000 mg | ORAL_TABLET | Freq: Three times a day (TID) | ORAL | 1 refills | Status: DC

## 2023-04-07 NOTE — Patient Instructions (Signed)
Shingrix vaccine at your pharmacy

## 2023-04-08 ENCOUNTER — Telehealth: Payer: Self-pay | Admitting: Family Medicine

## 2023-04-08 LAB — WOUND CULTURE
MICRO NUMBER:: 15946944
SPECIMEN QUALITY:: ADEQUATE

## 2023-04-08 MED ORDER — DOXYCYCLINE HYCLATE 100 MG PO TABS: 100.0000 mg | ORAL_TABLET | Freq: Two times a day (BID) | ORAL | 0 refills | Status: DC

## 2023-04-08 NOTE — Telephone Encounter (Signed)
Copied from CRM [id number]. Topic: Clinical - Medication Question >> Apr 08, 2023  1:37 PM Elizebeth Brooking wrote: Reason for CRM: Patient called in asking to speak with the nurse, about the medication that was just recently prescribed for him is requesting a callback at [phone]

## 2023-04-08 NOTE — Addendum Note (Signed)
Addended by: Hannah Beat on: 04/08/2023 11:21 AM   Modules accepted: Orders

## 2023-04-08 NOTE — Telephone Encounter (Signed)
Returned Mr. Lordan call.  He states he already has some Doxycyline 100 mg at home so he will take that instead of picking up the Rx sent in to [facility].  I advised as long as it is 100 mg and he has 20 tablets to take 1 twice a day for 10 days, then that would be fine.

## 2023-05-01 ENCOUNTER — Other Ambulatory Visit: Payer: Self-pay | Admitting: Family Medicine

## 2023-05-24 ENCOUNTER — Ambulatory Visit: Payer: Medicare HMO | Attending: Cardiovascular Disease

## 2023-05-24 DIAGNOSIS — I351 Nonrheumatic aortic (valve) insufficiency: Secondary | ICD-10-CM | POA: Diagnosis not present

## 2023-05-24 LAB — ECHOCARDIOGRAM COMPLETE
AV Mean grad: 6 mmHg
AV Peak grad: 12.1 mmHg
Ao pk vel: 1.74 m/s
Area-P 1/2: 3.65 cm2
Calc EF: 52.7 %
P 1/2 time: 358 ms
S' Lateral: 5.3 cm
Single Plane A2C EF: 50.9 %
Single Plane A4C EF: 52.5 %

## 2023-05-24 NOTE — Progress Notes (Unsigned)
 Cardiology Office Note  Date:  05/25/2023   ID:  Corey Williams, DOB May 01, 1945, MRN [id number]  PCP:  Hannah Beat, MD   Chief Complaint  Patient presents with   12 month follow up      "Doing well."     HPI:  Corey Williams is a  78 year old gentleman with history of  hyperlipidemia  Aortic valve moderate to severe regurgitation. borderline dilated Ascending aorta on echocardiogram in 2015 , 3.8 cm in June 29, 2017 who presents for follow-up of his aortic valve regurgitation.  Last seen by myself in clinic December 2023  Echocardiogram performed May 24, 2023 Images pulled up and reviewed Moderate to severe aortic valve stenosis Dilation of left ventricle over 6 cm Normal strain Ejection fraction 50 to 55%, apical images concerning for EF 45-50  Exercising walks 3 miles , denies SOB on exertion Wife with medical issues, recent surgery, staph complication Mild dementia, he is having to take care of her as she recovers  On propranolol for tremor, "just started" Does not feel it has helped very much with the tremor  Recent office visits blood pressure running 130s up to 150 systolic, diastolic typically in the 60 range On propranolol 3 times daily and losartan 25 daily  Denies any PND orthopnea, no leg swelling Feels that his breathing on exertion is stable  EKG personally reviewed by myself on todays visit EKG Interpretation Date/Time:  Tuesday May 25 2023 08:21:01 EST Ventricular Rate:  58 PR Interval:  338 QRS Duration:  72 QT Interval:  354 QTC Calculation: 347 R Axis:   29  Text Interpretation: Sinus bradycardia with 1st degree A-V block Left ventricular hypertrophy with repolarization abnormality ( Sokolow-Lyon ) No previous ECGs available Confirmed by Julien Nordmann [phone]) on 05/25/2023 8:24:41 AM   Stress  in 06/29/2020, daughter-in-law died suddenly  2 children, age 67 and 73   PMH:   has a past medical history of Aortic insufficiency, Ascending aorta dilatation (HCC),  Basal cell carcinoma (04/21/2021), Dilated aortic root (HCC), Hereditary essential tremor (12/28/2013), Hyperlipidemia, and Hypertension.  PSH:    Past Surgical History:  Procedure Laterality Date   no prior surgery      Current Outpatient Medications  Medication Sig Dispense Refill   aspirin 81 MG tablet Take 81 mg by mouth daily.     Calcium Carbonate (CALCIUM 600 PO) Take 1 tablet by mouth daily.     ibuprofen (ADVIL,MOTRIN) 200 MG tablet Take 200 mg by mouth every 6 (six) hours as needed.     losartan (COZAAR) 25 MG tablet Take 25 mg by mouth daily.     Multiple Vitamin (MULTIVITAMIN) tablet Take 1 tablet by mouth daily.     pravastatin (PRAVACHOL) 40 MG tablet Take 1 tablet by mouth once daily 90 tablet 3   propranolol (INDERAL) 20 MG tablet Take 1 tablet (20 mg total) by mouth 3 (three) times daily. 90 tablet 1   No current facility-administered medications for this visit.    Allergies:   Patient has no known allergies.   Social History:  The patient  reports that he quit smoking about 49 years ago. His smoking use included cigarettes. He has never used smokeless tobacco. He reports current alcohol use of about 5.0 standard drinks of alcohol per week. He reports that he does not use drugs.   Family History:   family history includes Stroke in his father.    Review of Systems: Review of Systems  Constitutional: Negative.   HENT: Negative.  Respiratory: Negative.    Cardiovascular: Negative.   Gastrointestinal: Negative.   Musculoskeletal: Negative.   Neurological: Negative.   Psychiatric/Behavioral: Negative.    All other systems reviewed and are negative.   PHYSICAL EXAM: VS:  BP (!) 150/60 (BP Location: Left Arm, Patient Position: Sitting, Cuff Size: Normal)   Pulse (!) 58   Ht 6' (1.829 m)   Wt 214 lb 8 oz (97.3 kg)   SpO2 98%   BMI 29.09 kg/m  , BMI Body mass index is 29.09 kg/m. Constitutional:  oriented to person, place, and time. No distress.  HENT:   Head: Grossly normal Eyes:  no discharge. No scleral icterus.  Neck: No JVD, no carotid bruits  Cardiovascular: Regular rate and rhythm, 2/6 diastolic murmur right sternal border  Pulmonary/Chest: Clear to auscultation bilaterally, no wheezes or rails Abdominal: Soft.  no distension.  no tenderness.  Musculoskeletal: Normal range of motion Neurological:  normal muscle tone. Coordination normal. No atrophy Skin: Skin warm and dry Psychiatric: normal affect, pleasant   Recent Labs: 03/31/2023: ALT 15; BUN 12; Creatinine, Ser 0.94; Hemoglobin 13.9; Platelets 231.0; Potassium 4.5; Sodium 141    Lipid Panel Lab Results  Component Value Date   CHOL 169 03/31/2023   HDL 41.70 03/31/2023   LDLCALC 92 03/31/2023   TRIG 173.0 (H) 03/31/2023      Wt Readings from Last 3 Encounters:  05/25/23 214 lb 8 oz (97.3 kg)  04/07/23 211 lb (95.7 kg)  04/05/23 212 lb 8 oz (96.4 kg)     ASSESSMENT AND PLAN:  Aortic valve regurgitation  Images pulled up and reviewed from echo performed yesterday LV dilation noted, mild decrease in left ventricular function again noted He reports that he is asymptomatic Continues to walk 3 miles a day, does not have to stop for shortness of breath No PND orthopnea, no leg swelling He certainly meets indications for aortic valve surgery given severity of regurgitation, likely severe on review of images, dilation of left ventricle, mild decrease in left ventricular function He reports that given recent stressors at home, timing is poor He will call us for any worsening shortness of breath We have recommended we work on identifying the appropriate cardiothoracic surgeon for his valve surgery, including minimally invasive approaches  HYPERCHOLESTEROLEMIA  CT scan chest reviewed, no significant calcification in aorta, minimal if any coronary calcification Continue pravastatin 40 daily  Aneurysm of ascending aorta (HCC) Mild dilation aortic root and ascending  aorta Stressed importance of aggressive blood pressure control  Essential hypertension Blood pressure mildly elevated on today's visit, recommended he closely monitor blood pressure at home Currently taking propranolol 25 mg 3 times daily with losartan 25 daily For high blood pressure could increase losartan     Orders Placed This Encounter  Procedures   EKG 12-Lead     Signed, Dossie Arbour, M.D., Ph.D. 05/25/2023  [facility] [place], [place] [phone]

## 2023-05-25 ENCOUNTER — Ambulatory Visit: Payer: Medicare HMO | Attending: Cardiovascular Disease | Admitting: Cardiovascular Disease

## 2023-05-25 ENCOUNTER — Encounter: Payer: Self-pay | Admitting: Cardiovascular Disease

## 2023-05-25 VITALS — BP 150/60 | HR 58 | Ht 72.0 in | Wt 214.5 lb

## 2023-05-25 DIAGNOSIS — I10 Essential (primary) hypertension: Secondary | ICD-10-CM

## 2023-05-25 DIAGNOSIS — E7800 Pure hypercholesterolemia, unspecified: Secondary | ICD-10-CM | POA: Diagnosis not present

## 2023-05-25 DIAGNOSIS — I351 Nonrheumatic aortic (valve) insufficiency: Secondary | ICD-10-CM

## 2023-05-25 DIAGNOSIS — I7121 Aneurysm of the ascending aorta, without rupture: Secondary | ICD-10-CM

## 2023-05-25 NOTE — Patient Instructions (Addendum)
 Monitor blood pressure at home, mychart with numbers   Medication Instructions:  No changes  If you need a refill on your cardiac medications before your next appointment, please call your pharmacy.   Lab work: No new labs needed  Testing/Procedures: Echo in one year for aortic valve regurgitation  Your physician has requested that you have an echocardiogram. Echocardiography is a painless test that uses sound waves to create images of your heart. It provides your doctor with information about the size and shape of your heart and how well your heart's chambers and valves are working.   You may receive an ultrasound enhancing agent through an IV if needed to better visualize your heart during the echo. This procedure takes approximately one hour.  There are no restrictions for this procedure.  This will take place at [address] [facility]) [address], [place] [address]  Please note: We ask at that you not bring children with you during ultrasound (echo/ vascular) testing. Due to room size and safety concerns, children are not allowed in the ultrasound rooms during exams. Our front office staff cannot provide observation of children in our lobby area while testing is being conducted. An adult accompanying a patient to their appointment will only be allowed in the ultrasound room at the discretion of the ultrasound technician under special circumstances. We apologize for any inconvenience.   Follow-Up: At [facility], you and your health needs are our priority.  As part of our continuing mission to provide you with exceptional heart care, we have created designated Provider Care Teams.  These Care Teams include your primary Cardiologist (physician) and Advanced Practice Providers (APPs -  Physician Assistants and Nurse Practitioners) who all work together to provide you with the care you need, when you need it.  You will need a follow up appointment in 12 months after  echocardiogram  Providers on your designated Care Team:   Nicolasa Ducking, NP Eula Listen, PA-C Cadence Fransico [place], [place]  COVID-19 Vaccine Information can be found at: [email or web address] For questions related to vaccine distribution or appointments, please email [email or web address] or call [phone].

## 2023-10-06 DIAGNOSIS — Z1331 Encounter for screening for depression: Secondary | ICD-10-CM | POA: Diagnosis not present

## 2023-10-06 DIAGNOSIS — R310 Gross hematuria: Secondary | ICD-10-CM | POA: Diagnosis not present

## 2023-10-06 DIAGNOSIS — N3001 Acute cystitis with hematuria: Secondary | ICD-10-CM | POA: Diagnosis not present

## 2023-11-09 ENCOUNTER — Encounter: Payer: Self-pay | Admitting: Urology

## 2023-11-09 ENCOUNTER — Ambulatory Visit: Admitting: Urology

## 2023-11-09 VITALS — BP 148/70 | HR 64 | Ht 72.0 in | Wt 212.0 lb

## 2023-11-09 DIAGNOSIS — R310 Gross hematuria: Secondary | ICD-10-CM | POA: Diagnosis not present

## 2023-11-09 LAB — MICROSCOPIC EXAMINATION: RBC, Urine: >30 /HPF — AB (ref 0–2)

## 2023-11-09 LAB — URINALYSIS, COMPLETE
Bilirubin, UA: NEGATIVE
Glucose, UA: NEGATIVE
Ketones, UA: NEGATIVE
Leukocytes,UA: NEGATIVE
Nitrite, UA: NEGATIVE
Protein,UA: NEGATIVE
Specific Gravity, UA: <1.005 — ABNORMAL LOW (ref 1.005–1.030)
Urobilinogen, Ur: 0.2 mg/dL (ref 0.2–1.0)
pH, UA: 7 (ref 5.0–7.5)

## 2023-11-09 NOTE — Progress Notes (Signed)
 11/09/2023 9:30 AM   Corey Williams [birth year] [id number]  Referring provider: Delaine Corey Bring, MD [address] [place],  [place] [address]  Chief Complaint  Patient presents with   Hematuria    HPI: Corey Williams is a 78 y.o. male referred for evaluation and management of hematuria.  Urgent care visit 10/06/2023 for gross hematuria.  Urinalysis: 10/06/2023; >64 RBC; light red in appearance; UCx negative Gross hematuria: Prior episode total gross painless hematuria 10/17/2022.  Seen in ED.  UA with 21-50 RBC.  Negative urine culture.  CT renal stone study performed which showed bilateral renal cyst and a 2 x 1.6 x 1.4 cm ill-defined area of increased attenuation posterior bladder.  Urology follow-up was recommended.  Was treated empirically with Cipro . UCx negative Associated lower urinary tract symptoms: None Baseline lower urinary tract symptoms: Mild urinary frequency, nocturia x 2 Pain: Denied flank, abdominal or pelvic pain Prior urologic history: Negative Blood thinners/antiplatelet meds: Negative Tobacco history: Smoked x 4 years when in the [employer]   PMH: Past Medical History:  Diagnosis Date   Aortic insufficiency    Ascending aorta dilatation (HCC)    Basal cell carcinoma 04/21/2021   right lateral neck below the ear, EDC   Dilated aortic root (HCC)    Hereditary essential tremor 12/28/2013   Hyperlipidemia    Hypertension     Surgical History: Past Surgical History:  Procedure Laterality Date   no prior surgery      Home Medications:  Allergies as of 11/09/2023   No Known Allergies      Medication List        Accurate as of November 09, 2023  9:30 AM. If you have any questions, ask your nurse or doctor.          STOP taking these medications    propranolol  20 MG tablet Commonly known as: INDERAL  Stopped by: Corey Williams       TAKE these medications    aspirin 81 MG tablet Take 81 mg by mouth daily.   CALCIUM 600 PO Take 1  tablet by mouth daily.   ibuprofen 200 MG tablet Commonly known as: ADVIL Take 200 mg by mouth every 6 (six) hours as needed.   losartan  25 MG tablet Commonly known as: COZAAR  Take 25 mg by mouth daily.   multivitamin tablet Take 1 tablet by mouth daily.   pravastatin  40 MG tablet Commonly known as: PRAVACHOL  Take 1 tablet by mouth once daily        Allergies: No Known Allergies  Family History: Family History  Problem Relation Age of Onset   Stroke Father    Colon cancer Neg Hx    Stomach cancer Neg Hx     Social History:  reports that he quit smoking about 49 years ago. His smoking use included cigarettes. He has never used smokeless tobacco. He reports current alcohol use of about 5.0 standard drinks of alcohol per week. He reports that he does not use drugs.   Physical Exam: BP (!) 148/70   Pulse 64   Ht 6' (1.829 m)   Wt 212 lb (96.2 kg)   BMI 28.75 kg/m   Constitutional:  Alert and oriented, No acute distress. HEENT: [place] AT. Respiratory: Normal respiratory effort, no increased work of breathing. Psychiatric: Normal mood and affect.  Laboratory Data:  Lab Results  Component Value Date   PSA 1.13 03/31/2023   PSA 1.05 03/24/2022   PSA 1.14 03/26/2021  Urinalysis Dipstick 3+ blood Microscopy >30 RBC   Pertinent Imaging: CT images were personally reviewed and interpreted  CT Renal Stone Study  Narrative CLINICAL DATA:  Gross hematuria.  EXAM: CT ABDOMEN AND PELVIS WITHOUT CONTRAST  TECHNIQUE: Multidetector CT imaging of the abdomen and pelvis was performed following the standard protocol without IV contrast.  RADIATION DOSE REDUCTION: This exam was performed according to the departmental dose-optimization program which includes automated exposure control, adjustment of the mA and/or kV according to patient size and/or use of iterative reconstruction technique.  COMPARISON:  None Available.  FINDINGS: Lower chest: A 6 mm, partially  imaged, pleural based lung nodule versus focal scar seen within the posterolateral aspect of the right lower lobe (axial CT image 1, CT series 4).  Hepatobiliary: A 1.4 cm x 0.7 cm focus of parenchymal low attenuation is seen within the medial aspect of the right lobe of the liver (axial CT image 18, CT series 2). No gallstones, gallbladder wall thickening, or biliary dilatation.  Pancreas: Unremarkable. No pancreatic ductal dilatation or surrounding inflammatory changes.  Spleen: Normal in size without focal abnormality.  Adrenals/Urinary Tract: Adrenal glands are unremarkable. Kidneys are normal in size, without obstructing renal calculi or hydronephrosis. Numerous bilateral renal cysts of various sizes are seen. The urinary bladder is poorly distended and subsequently limited in evaluation. A 2.0 cm x 1.6 cm x 1.4 cm ill-defined area of mildly increased attenuation (approximately 33.92 Hounsfield units) is seen within the posterior aspect of the urinary bladder lumen on the right (axial CT image 87, CT series 2/coronal re-formatted image 61, CT series 5).  Stomach/Bowel: Stomach is within normal limits. Appendix appears normal. No evidence of bowel wall thickening, distention, or inflammatory changes. Noninflamed diverticula are seen throughout the descending and sigmoid colon.  Vascular/Lymphatic: Aortic atherosclerosis. No enlarged abdominal or pelvic lymph nodes.  Reproductive: The prostate gland is moderately enlarged. Mass effect is seen along the base of the urinary bladder.  Other: There is a 2.9 cm x 2.4 cm x 3.4 cm fat containing umbilical hernia. A 6.1 cm x 3.2 cm fat containing right inguinal hernia is seen. An additional 4.5 cm x 2.4 cm fat containing left inguinal hernia is also noted.  No abdominopelvic ascites.  Musculoskeletal: Marked severity multilevel degenerative changes are seen throughout the lumbar spine.  IMPRESSION: 1. Area within the urinary  bladder lumen, as described above, which may represent a small amount of blood products. Correlation with cystoscopy is recommended, as a small urinary bladder mass cannot be excluded. 2. Numerous bilateral renal cysts. No follow-up imaging is recommended. This recommendation follows ACR consensus guidelines: Management of the Incidental Renal Mass on CT: A White Paper of the ACR Incidental Findings Committee. J Am Coll Radiol [phone]. 3. Colonic diverticulosis. 4. Fat containing umbilical and bilateral inguinal hernias. 5. 6 mm, partially imaged, right lower lobe pleural based lung nodule versus focal scar. Further evaluation with dedicated, nonemergent chest CT is recommended. 6. Aortic atherosclerosis.  Aortic Atherosclerosis (ICD10-I70.0).   Electronically Signed By: Suzen Dials M.D. On: 10/17/2022 04:25   Assessment & Plan:    1.  Gross hematuria AUA risk stratification: High We discussed the recommended evaluation of high risk hematuria which consist of CT urogram and cystoscopy.  Renal stone CT July 2024 showed no upper tract abnormalities and abnormality posterior bladder wall. Will initially schedule cystoscopy and if no lower tract findings will schedule CT urogram    Corey [name], MD  [facility] [place] [phone]  [address], [address] [place], [place] [address] [phone]

## 2023-11-19 DIAGNOSIS — R319 Hematuria, unspecified: Secondary | ICD-10-CM | POA: Diagnosis not present

## 2023-11-24 NOTE — Telephone Encounter (Signed)
 This encounter was created in error - please disregard.

## 2023-12-07 ENCOUNTER — Encounter: Payer: Self-pay | Admitting: Internal Medicine

## 2023-12-07 ENCOUNTER — Inpatient Hospital Stay
Admission: EM | Admit: 2023-12-07 | Discharge: 2023-12-10 | DRG: 872 | Disposition: A | Discharge status: Home / Self Care | Attending: Student | Admitting: Student

## 2023-12-07 ENCOUNTER — Ambulatory Visit: Admitting: Urology

## 2023-12-07 ENCOUNTER — Other Ambulatory Visit: Payer: Self-pay

## 2023-12-07 ENCOUNTER — Encounter: Payer: Self-pay | Admitting: Urology

## 2023-12-07 VITALS — BP 177/74 | HR 97 | Ht 72.0 in | Wt 212.0 lb

## 2023-12-07 DIAGNOSIS — L03317 Cellulitis of buttock: Secondary | ICD-10-CM | POA: Diagnosis not present

## 2023-12-07 DIAGNOSIS — K5730 Diverticulosis of large intestine without perforation or abscess without bleeding: Secondary | ICD-10-CM | POA: Diagnosis not present

## 2023-12-07 DIAGNOSIS — I10 Essential (primary) hypertension: Secondary | ICD-10-CM | POA: Diagnosis present

## 2023-12-07 DIAGNOSIS — E861 Hypovolemia: Secondary | ICD-10-CM | POA: Diagnosis present

## 2023-12-07 DIAGNOSIS — A419 Sepsis, unspecified organism: Principal | ICD-10-CM

## 2023-12-07 DIAGNOSIS — L0390 Cellulitis, unspecified: Secondary | ICD-10-CM | POA: Diagnosis present

## 2023-12-07 DIAGNOSIS — E785 Hyperlipidemia, unspecified: Secondary | ICD-10-CM | POA: Diagnosis not present

## 2023-12-07 DIAGNOSIS — I7121 Aneurysm of the ascending aorta, without rupture: Secondary | ICD-10-CM | POA: Diagnosis present

## 2023-12-07 DIAGNOSIS — Z7982 Long term (current) use of aspirin: Secondary | ICD-10-CM | POA: Diagnosis not present

## 2023-12-07 DIAGNOSIS — K5900 Constipation, unspecified: Secondary | ICD-10-CM | POA: Diagnosis present

## 2023-12-07 DIAGNOSIS — N329 Bladder disorder, unspecified: Secondary | ICD-10-CM | POA: Diagnosis present

## 2023-12-07 DIAGNOSIS — R54 Age-related physical debility: Secondary | ICD-10-CM | POA: Diagnosis not present

## 2023-12-07 DIAGNOSIS — E871 Hypo-osmolality and hyponatremia: Secondary | ICD-10-CM | POA: Diagnosis not present

## 2023-12-07 DIAGNOSIS — B9562 Methicillin resistant Staphylococcus aureus infection as the cause of diseases classified elsewhere: Secondary | ICD-10-CM | POA: Diagnosis present

## 2023-12-07 DIAGNOSIS — Z79899 Other long term (current) drug therapy: Secondary | ICD-10-CM | POA: Diagnosis not present

## 2023-12-07 DIAGNOSIS — R319 Hematuria, unspecified: Secondary | ICD-10-CM | POA: Diagnosis present

## 2023-12-07 DIAGNOSIS — N281 Cyst of kidney, acquired: Secondary | ICD-10-CM | POA: Diagnosis not present

## 2023-12-07 DIAGNOSIS — D72829 Elevated white blood cell count, unspecified: Secondary | ICD-10-CM

## 2023-12-07 DIAGNOSIS — N3289 Other specified disorders of bladder: Secondary | ICD-10-CM

## 2023-12-07 DIAGNOSIS — K7689 Other specified diseases of liver: Secondary | ICD-10-CM | POA: Diagnosis not present

## 2023-12-07 DIAGNOSIS — G250 Essential tremor: Secondary | ICD-10-CM | POA: Diagnosis not present

## 2023-12-07 DIAGNOSIS — L0231 Cutaneous abscess of buttock: Secondary | ICD-10-CM | POA: Diagnosis not present

## 2023-12-07 DIAGNOSIS — Z87891 Personal history of nicotine dependence: Secondary | ICD-10-CM | POA: Diagnosis not present

## 2023-12-07 DIAGNOSIS — Z823 Family history of stroke: Secondary | ICD-10-CM | POA: Diagnosis not present

## 2023-12-07 DIAGNOSIS — Z85828 Personal history of other malignant neoplasm of skin: Secondary | ICD-10-CM

## 2023-12-07 DIAGNOSIS — R310 Gross hematuria: Secondary | ICD-10-CM

## 2023-12-07 LAB — CBC WITH DIFFERENTIAL/PLATELET
Abs Immature Granulocytes: 0.05 K/uL (ref 0.00–0.07)
Basophils Absolute: 0.1 K/uL (ref 0.0–0.1)
Basophils Relative: 1 %
Eosinophils Absolute: 0.2 K/uL (ref 0.0–0.5)
Eosinophils Relative: 2 %
HCT: 41.2 % (ref 39.0–52.0)
Hemoglobin: 13.7 g/dL (ref 13.0–17.0)
Immature Granulocytes: 1 %
Lymphocytes Relative: 7 %
Lymphs Abs: 0.8 K/uL (ref 0.7–4.0)
MCH: 32.5 pg (ref 26.0–34.0)
MCHC: 33.3 g/dL (ref 30.0–36.0)
MCV: 97.6 fL (ref 80.0–100.0)
Monocytes Absolute: 1 K/uL (ref 0.1–1.0)
Monocytes Relative: 10 %
Neutro Abs: 8.7 K/uL — ABNORMAL HIGH (ref 1.7–7.7)
Neutrophils Relative %: 79 %
Platelets: 260 K/uL (ref 150–400)
RBC: 4.22 MIL/uL (ref 4.22–5.81)
RDW: 11.8 % (ref 11.5–15.5)
WBC: 10.7 K/uL — ABNORMAL HIGH (ref 4.0–10.5)
nRBC: 0 % (ref 0.0–0.2)

## 2023-12-07 LAB — COMPREHENSIVE METABOLIC PANEL WITH GFR
ALT: 27 U/L (ref 0–44)
AST: 31 U/L (ref 15–41)
Albumin: 3.6 g/dL (ref 3.5–5.0)
Alkaline Phosphatase: 55 U/L (ref 38–126)
Anion gap: 13 (ref 5–15)
BUN: 17 mg/dL (ref 8–23)
CO2: 25 mmol/L (ref 22–32)
Calcium: 9.1 mg/dL (ref 8.9–10.3)
Chloride: 94 mmol/L — ABNORMAL LOW (ref 98–111)
Creatinine, Ser: 0.95 mg/dL (ref 0.61–1.24)
GFR, Estimated: >60 mL/min (ref >60)
Glucose, Bld: 97 mg/dL (ref 70–99)
Potassium: 4.2 mmol/L (ref 3.5–5.1)
Sodium: 132 mmol/L — ABNORMAL LOW (ref 135–145)
Total Bilirubin: 1.8 mg/dL — ABNORMAL HIGH (ref 0.0–1.2)
Total Protein: 8 g/dL (ref 6.5–8.1)

## 2023-12-07 LAB — LACTIC ACID, PLASMA: Lactic Acid, Venous: 1.2 mmol/L (ref 0.5–1.9)

## 2023-12-07 LAB — PROTIME-INR
INR: 1.1 (ref 0.8–1.2)
Prothrombin Time: 14.8 s (ref 11.4–15.2)

## 2023-12-07 MED ORDER — VANCOMYCIN HCL IN DEXTROSE 1-5 GM/200ML-% IV SOLN
1000.0000 mg | Freq: Two times a day (BID) | INTRAVENOUS | Status: DC
  Administered 2023-12-08 – 2023-12-09 (×3): 1000 mg via INTRAVENOUS
  Filled 2023-12-07 (×4): qty 200

## 2023-12-07 MED ORDER — ACETAMINOPHEN 325 MG PO TABS: 650.0000 mg | ORAL_TABLET | Freq: Four times a day (QID) | ORAL | Status: DC | PRN

## 2023-12-07 MED ORDER — SODIUM CHLORIDE 0.9 % IV SOLN
2.0000 g | INTRAVENOUS | Status: DC
  Administered 2023-12-08: 2 g via INTRAVENOUS
  Filled 2023-12-07 (×2): qty 20

## 2023-12-07 MED ORDER — ACETAMINOPHEN 325 MG PO TABS
650.0000 mg | ORAL_TABLET | Freq: Once | ORAL | Status: AC
  Administered 2023-12-07: 650 mg via ORAL
  Filled 2023-12-07: qty 2

## 2023-12-07 MED ORDER — HYDRALAZINE HCL 20 MG/ML IJ SOLN: 5.0000 mg | Freq: Four times a day (QID) | INTRAMUSCULAR | Status: DC | PRN

## 2023-12-07 MED ORDER — HEPARIN SODIUM (PORCINE) 5000 UNIT/ML IJ SOLN
5000.0000 [IU] | Freq: Three times a day (TID) | INTRAMUSCULAR | Status: DC
  Administered 2023-12-07 – 2023-12-10 (×8): 5000 [IU] via SUBCUTANEOUS
  Filled 2023-12-07 (×8): qty 1

## 2023-12-07 MED ORDER — ONDANSETRON HCL 4 MG/2ML IJ SOLN: 4.0000 mg | Freq: Four times a day (QID) | INTRAMUSCULAR | Status: DC | PRN

## 2023-12-07 MED ORDER — MORPHINE SULFATE (PF) 2 MG/ML IV SOLN: 2.0000 mg | INTRAVENOUS | Status: AC | PRN

## 2023-12-07 MED ORDER — LACTATED RINGERS IV BOLUS
1000.0000 mL | Freq: Once | INTRAVENOUS | Status: AC
  Administered 2023-12-07: 1000 mL via INTRAVENOUS

## 2023-12-07 MED ORDER — SODIUM CHLORIDE 0.9 % IV SOLN
2.0000 g | Freq: Once | INTRAVENOUS | Status: AC
  Administered 2023-12-07: 2 g via INTRAVENOUS
  Filled 2023-12-07: qty 20

## 2023-12-07 MED ORDER — LIDOCAINE-EPINEPHRINE 2 %-1:100000 IJ SOLN
20.0000 mL | Freq: Once | INTRAMUSCULAR | Status: AC
  Administered 2023-12-07: 20 mL
  Filled 2023-12-07: qty 1

## 2023-12-07 MED ORDER — TRAMADOL HCL 50 MG PO TABS
50.0000 mg | ORAL_TABLET | Freq: Four times a day (QID) | ORAL | Status: AC | PRN
  Administered 2023-12-07 – 2023-12-08 (×2): 50 mg via ORAL
  Filled 2023-12-07 (×2): qty 1

## 2023-12-07 MED ORDER — ACETAMINOPHEN 650 MG RE SUPP: 650.0000 mg | Freq: Four times a day (QID) | RECTAL | Status: DC | PRN

## 2023-12-07 MED ORDER — VANCOMYCIN HCL 2000 MG/400ML IV SOLN
2000.0000 mg | Freq: Once | INTRAVENOUS | Status: AC
  Administered 2023-12-07: 2000 mg via INTRAVENOUS
  Filled 2023-12-07: qty 400

## 2023-12-07 MED ORDER — VANCOMYCIN HCL IN DEXTROSE 1-5 GM/200ML-% IV SOLN
1000.0000 mg | Freq: Once | INTRAVENOUS | Status: DC
  Filled 2023-12-07: qty 200

## 2023-12-07 MED ORDER — ONDANSETRON HCL 4 MG PO TABS: 4.0000 mg | ORAL_TABLET | Freq: Four times a day (QID) | ORAL | Status: DC | PRN

## 2023-12-07 NOTE — Progress Notes (Signed)
 CODE SEPSIS - PHARMACY COMMUNICATION  **Broad Spectrum Antibiotics should be administered within 1 hour of Sepsis diagnosis**  Time Code Sepsis Called/Page Received: 1452  Antibiotics Ordered: ceftriaxone  and vancomycin   Time of 1st antibiotic administration: 1454  Additional action taken by pharmacy: N/A  If necessary, Name of Provider/Nurse Contacted: N/A    Lum VEAR Mania ,PharmD Clinical Pharmacist  12/07/2023  2:52 PM

## 2023-12-07 NOTE — H&P (Signed)
 History and Physical   ENOC GETTER FMW:[id number] DOB: 06/30/1945 DOA: 12/07/2023  PCP: Watt Mirza, MD  Patient coming from: Outpatient procedure  I have personally briefly reviewed patient's old medical records in [facility] EMR.  Chief Concern: Fever, abscess  HPI: [name] is a 78 year old male with history of hypertension, hyperlipidemia, presents emergency department from outpatient cystoscopy procedure for chief concerns of fever and right buttock abscess.  Vitals in the ED showed t of 101.3, rr 16, heart rate 93, blood pressure 127/67, SpO2 98% on room air.  Serum sodium is 132, potassium 4.2, chloride 94, bicarb 25, BUN of 17, serum creatinine 0.95, eGFR greater than 60, nonfasting blood glucose 97, WBC 10.7, hemoglobin 13.7, platelets of 260.  ED treatment: Acetaminophen  650 mg p.o. one-time dose, incision and drainage with lidocaine , ceftriaxone  2 g IV one-time dose, LR 1 L bolus, vancomycin  per pharmacy. ----------------------------------- At bedside, patient is able to tell me his first and last name, age, location, current calendar year.  Reports he does not know what happened that he noticed right sided tenderness and discomfort since last Wednesday.  Reports that on Saturday it started to hurt.  He states that he mows his lawn every Monday or Tuesday so he could have been stung by a wasp he does not know.  He denies fever, chills, nausea, vomiting, chest pain, abdominal pain, dysuria, diarrhea at home.  He reports occasional constipation.  He reports hematuria that has been ongoing since August.  Social history: He lives at home with his wife.  He denies tobacco, EtOH, recreational drug use.  ROS: Constitutional: no weight change, + fever ENT/Mouth: no sore throat, no rhinorrhea Eyes: no eye pain, no vision changes Cardiovascular: no chest pain, no dyspnea,  no edema, no palpitations Respiratory: no cough, no sputum, no wheezing Gastrointestinal: no  nausea, no vomiting, no diarrhea, no constipation Genitourinary: no urinary incontinence, no dysuria, no hematuria Musculoskeletal: no arthralgias, no myalgias Skin: + skin lesions, no pruritus, Neuro: + weakness, no loss of consciousness, no syncope Psych: no anxiety, no depression, + decrease appetite Heme/Lymph: no bruising, no bleeding  ED Course: Discussed with EDP, patient requiring hospitalization for chief concerns of cellulitis with abscess and fever concerning for sepsis.  Assessment/Plan  Principal Problem:   Sepsis due to cellulitis [facility]) Active Problems:   Cellulitis   Essential hypertension   Leukocytosis   Hereditary essential tremor   Aneurysm of ascending aorta (HCC)   Assessment and Plan:  * Sepsis due to cellulitis [facility]) Patient had increased heart rate, fever, source is cellulitis/abscess Blood cultures x 2 are in process Aerobic culture in process Continue with ceftriaxone  2 g IV daily and vancomycin  per pharmacy  Cellulitis With abscess and fever  Blood cultures x 2 and aerobic culture with Gram stain are in process Continue with ceftriaxone  2 g IV daily to complete a 5-day course and vancomycin  per pharmacy  Leukocytosis Suspect secondary to cellulitis with abscess, treatment per above Recheck CBC in the a.m.  Essential hypertension Hydralazine  5 mg IV every 6 hours as needed for SBP > 175, 5 days ordered  Chart reviewed.   DVT prophylaxis: Heparin  5000 units subcutaneous every 8 hours Code Status: Full code.  ACP reviewed and patient desires a natural death however at bedside, patient desires full code at this time Diet: Heart healthy Family Communication: Updated spouse and daughter Isaiah at bedside with patient's permission Disposition Plan: Pending clinical course Consults called: Pharmacy Admission status: Telemetry medical, inpatient  Past Medical History:  Diagnosis Date   Aortic insufficiency    Ascending aorta dilatation (HCC)     Basal cell carcinoma 04/21/2021   right lateral neck below the ear, EDC   Dilated aortic root (HCC)    Hereditary essential tremor 12/28/2013   Hyperlipidemia    Hypertension    Past Surgical History:  Procedure Laterality Date   no prior surgery     Social History:  reports that he quit smoking about 49 years ago. His smoking use included cigarettes. He has never used smokeless tobacco. He reports current alcohol use of about 5.0 standard drinks of alcohol per week. He reports that he does not use drugs.  No Known Allergies Family History  Problem Relation Age of Onset   Stroke Father    Colon cancer Neg Hx    Stomach cancer Neg Hx    Family history: Family history reviewed and not pertinent.  Prior to Admission medications   Medication Sig Start Date End Date Taking? Authorizing Provider  aspirin  81 MG tablet Take 81 mg by mouth daily.    [provider]  Calcium Carbonate (CALCIUM 600 PO) Take 1 tablet by mouth daily.    [provider]  ibuprofen (ADVIL,MOTRIN) 200 MG tablet Take 200 mg by mouth every 6 (six) hours as needed.    [provider]  losartan  (COZAAR ) 25 MG tablet Take 25 mg by mouth daily. 05/17/23   [provider]  Multiple Vitamin (MULTIVITAMIN) tablet Take 1 tablet by mouth daily.    [provider]  pravastatin  (PRAVACHOL ) 40 MG tablet Take 1 tablet by mouth once daily 05/03/23   Watt Mirza, MD   Physical Exam: Vitals:   12/07/23 1418 12/07/23 1422 12/07/23 1425 12/07/23 1430  BP:    135/67  Pulse: 92 93 91 95  Resp: 14 16 16 20   Temp:      TempSrc:      SpO2: 98% 97% 98% 99%  Weight:       Constitutional: appears age-appropriate, frail Eyes: PERRL, lids and conjunctivae normal ENMT: Mucous membranes are dry. Posterior pharynx clear of any exudate or lesions. Age-appropriate dentition. Hearing appropriate Neck: normal, supple, no masses, no thyromegaly Respiratory: clear to auscultation  bilaterally, no wheezing, no crackles. Normal respiratory effort. No accessory muscle use.  Cardiovascular: Regular rate and rhythm, no murmurs / rubs / gallops. No extremity edema. 2+ pedal pulses. No carotid bruits.  Abdomen: no tenderness, no masses palpated, no hepatosplenomegaly. Bowel sounds positive.  Musculoskeletal: no clubbing / cyanosis. No joint deformity upper and lower extremities. Good ROM, no contractures, no atrophy. Normal muscle tone.  Skin: No induration.  Right buttock skin lesion Neurologic: Sensation intact. Strength 5/5 in all 4.  Psychiatric: Normal judgment and insight. Alert and oriented x 3. Normal mood.   EKG: Not indicated at this time  Chest x-ray on Admission: Not indicated at this time No results found.  Labs on Admission: I have personally reviewed following labs  CBC: Recent Labs  Lab 12/07/23 1406  WBC 10.7*  NEUTROABS 8.7*  HGB 13.7  HCT 41.2  MCV 97.6  PLT 260   Basic Metabolic Panel: Recent Labs  Lab 12/07/23 1406  NA 132*  K 4.2  CL 94*  CO2 25  GLUCOSE 97  BUN 17  CREATININE 0.95  CALCIUM 9.1   GFR: Estimated Creatinine Clearance: 77 mL/min (by C-G formula based on SCr of 0.95 mg/dL).  Liver Function Tests: Recent Labs  Lab 12/07/23  1406  AST 31  ALT 27  ALKPHOS 55  BILITOT 1.8*  PROT 8.0  ALBUMIN 3.6   Coagulation Profile: Recent Labs  Lab 12/07/23 1406  INR 1.1   Urine analysis:    Component Value Date/Time   COLORURINE AMBER (A) 10/17/2022 0331   APPEARANCEUR Clear 11/09/2023 0859   LABSPEC 1.004 (L) 10/17/2022 0331   PHURINE 6.0 10/17/2022 0331   GLUCOSEU Negative 11/09/2023 0859   HGBUR LARGE (A) 10/17/2022 0331   BILIRUBINUR Negative 11/09/2023 0859   KETONESUR NEGATIVE 10/17/2022 0331   PROTEINUR Negative 11/09/2023 0859   PROTEINUR 100 (A) 10/17/2022 0331   NITRITE Negative 11/09/2023 0859   NITRITE NEGATIVE 10/17/2022 0331   LEUKOCYTESUR Negative 11/09/2023 0859   LEUKOCYTESUR NEGATIVE  10/17/2022 0331   This document was prepared using Dragon Voice Recognition software and may include unintentional dictation errors.  Dr. Sherre [facility]  If 7PM-7AM, please contact overnight-coverage provider If 7AM-7PM, please contact day attending provider [email or web address]  12/07/2023, 4:38 PM

## 2023-12-07 NOTE — Hospital Course (Signed)
 [name] is a 78 year old male with history of hypertension, hyperlipidemia, presents emergency department from outpatient cystoscopy procedure for chief concerns of fever and right buttock abscess.  Vitals in the ED showed t of 101.3, rr 16, heart rate 93, blood pressure 127/67, SpO2 98% on room air.  Serum sodium is 132, potassium 4.2, chloride 94, bicarb 25, BUN of 17, serum creatinine 0.95, eGFR greater than 60, nonfasting blood glucose 97, WBC 10.7, hemoglobin 13.7, platelets of 260.  ED treatment: Acetaminophen  650 mg p.o. one-time dose, incision and drainage with lidocaine , ceftriaxone  2 g IV one-time dose, LR 1 L bolus, vancomycin  per pharmacy.

## 2023-12-07 NOTE — ED Notes (Signed)
 Pt buttocks cleaned of blood, new 4x4 placed, chuck pad replaced.

## 2023-12-07 NOTE — Assessment & Plan Note (Signed)
 Suspect secondary to cellulitis with abscess, treatment per above Recheck CBC in the a.m.

## 2023-12-07 NOTE — ED Triage Notes (Addendum)
 To hospital for cysto today, sent to ED for C/O right buttock abscess.  Patient states he noticed some discomfort to right buttock Wednesday, symptoms worsened over the weekend.

## 2023-12-07 NOTE — Progress Notes (Signed)
 Pharmacy Antibiotic Note  Corey Williams is a 78 y.o. male admitted on 12/07/2023 with cellulitis.  Pharmacy has been consulted for vancomycin  dosing.  Plan: Give vancomycin  2000 mg IV x1 followed by 1000 mg IV Q12H. Goal AUC 400-550. Expected AUC: 460.9 Expected Css min: 13.7 SCr used: 0.95  Weight used: IBW, Vd used: 0.72 (BMI 28.7) Patient is also on ceftriaxone  2 g IV Q24H Continue to monitor renal function and follow culture results   Weight: 96 kg (211 lb 10.3 oz)  Temp (24hrs), Avg:101.3 F (38.5 C), Min:101.3 F (38.5 C), Max:101.3 F (38.5 C)  Recent Labs  Lab 12/07/23 1406  WBC 10.7*  CREATININE 0.95  LATICACIDVEN 1.2    Estimated Creatinine Clearance: 77 mL/min (by C-G formula based on SCr of 0.95 mg/dL).    No Known Allergies  Antimicrobials this admission: 9/16 Vanc >>  9/16 Ceftriaxone  >>   Microbiology results: 9/16 BCx: IP 9/16 Abscess cx: IP  Thank you for allowing pharmacy to be a part of this patient's care.  Lum VEAR Mania, PharmD, BCPS 12/07/2023 3:52 PM

## 2023-12-07 NOTE — Sepsis Progress Note (Signed)
 eLink is following this Code Sepsis.

## 2023-12-07 NOTE — ED Notes (Signed)
 EDP at bedside

## 2023-12-07 NOTE — Assessment & Plan Note (Addendum)
 With abscess and fever  Blood cultures x 2 and aerobic culture with Gram stain are in process Continue with ceftriaxone  2 g IV daily to complete a 5-day course and vancomycin  per pharmacy

## 2023-12-07 NOTE — ED Provider Notes (Signed)
 [facility] Provider Note    Event Date/Time   First MD Initiated Contact with Patient 12/07/23 1425     (approximate)   History   Chief Complaint Abscess   HPI  Corey Williams is a 78 y.o. male with past medical history of hypertension, hyperlipidemia, and ascending aortic aneurysm who presents to the ED complaining of abscess.  Patient reports that he has had a painful and swollen area to his right buttock increasing over the past 6 days.  He was scheduled for cystoscopy today for gross hematuria, but found to have fever and abscess to the buttock, subsequently sent to the ED for evaluation.  Patient denies any dysuria, abdominal pain, nausea, vomiting, diarrhea, cough, chest pain, or shortness of breath.  He has not noticed any drainage from his buttock, denies history of abscesses.     Physical Exam   Triage Vital Signs: ED Triage Vitals  Encounter Vitals Group     BP 12/07/23 1353 127/67     Girls Systolic BP Percentile --      Girls Diastolic BP Percentile --      Boys Systolic BP Percentile --      Boys Diastolic BP Percentile --      Pulse Rate 12/07/23 1353 93     Resp 12/07/23 1353 16     Temp 12/07/23 1353 (!) 101.3 F (38.5 C)     Temp Source 12/07/23 1353 Oral     SpO2 12/07/23 1353 98 %     Weight 12/07/23 1354 211 lb 10.3 oz (96 kg)     Height --      Head Circumference --      Peak Flow --      Pain Score 12/07/23 1354 5     Pain Loc --      Pain Education --      Exclude from Growth Chart --     Most recent vital signs: Vitals:   12/07/23 1425 12/07/23 1430  BP:  135/67  Pulse: 91 95  Resp: 16 20  Temp:    SpO2: 98% 99%    Constitutional: Alert and oriented. Eyes: Conjunctivae are normal. Head: Atraumatic. Nose: No congestion/rhinnorhea. Mouth/Throat: Mucous membranes are moist.  Cardiovascular: Normal rate, regular rhythm. Grossly normal heart sounds.  2+ radial pulses bilaterally. Respiratory: Normal respiratory  effort.  No retractions. Lungs CTAB. Gastrointestinal: Soft and nontender. No distention. Genitourinary: Erythema and edema noted to right buttock and extending towards the posterior perineum with induration and fluctuance around the buttock. Musculoskeletal: No lower extremity tenderness nor edema.  Neurologic:  Normal speech and language. No gross focal neurologic deficits are appreciated.    ED Results / Procedures / Treatments   Labs (all labs ordered are listed, but only abnormal results are displayed) Labs Reviewed  COMPREHENSIVE METABOLIC PANEL WITH GFR - Abnormal; Notable for the following components:      Result Value   Sodium 132 (*)    Chloride 94 (*)    Total Bilirubin 1.8 (*)    All other components within normal limits  CBC WITH DIFFERENTIAL/PLATELET - Abnormal; Notable for the following components:   WBC 10.7 (*)    Neutro Abs 8.7 (*)    All other components within normal limits  CULTURE, BLOOD (ROUTINE X 2)  CULTURE, BLOOD (ROUTINE X 2)  AEROBIC CULTURE W GRAM STAIN (SUPERFICIAL SPECIMEN)  PROTIME-INR  LACTIC ACID, PLASMA  LACTIC ACID, PLASMA  URINALYSIS, W/ REFLEX TO CULTURE (INFECTION SUSPECTED)  PROCEDURES:  Critical Care performed: No  .Incision and Drainage  Date/Time: 12/07/2023 2:53 PM  Performed by: Willo Dunnings, MD Authorized by: Willo Dunnings, MD   Consent:    Consent obtained:  Verbal   Consent given by:  Patient   Risks, benefits, and alternatives were discussed: yes     Risks discussed:  Bleeding, incomplete drainage, pain, infection and damage to other organs Universal protocol:    Patient identity confirmed:  Verbally with patient and arm band Location:    Type:  Abscess   Location:  Lower extremity   Lower extremity location:  Buttock   Buttock location:  R buttock Sedation:    Sedation type:  None Anesthesia:    Anesthesia method:  Local infiltration   Local anesthetic:  Lidocaine  2% WITH epi Procedure type:     Complexity:  Simple Procedure details:    Ultrasound guidance: no     Needle aspiration: no     Incision types:  Elliptical   Wound management:  Probed and deloculated and irrigated with saline   Drainage:  Bloody and purulent   Drainage amount:  Moderate   Wound treatment:  Wound left open   Packing materials:  None Post-procedure details:    Procedure completion:  Tolerated well, no immediate complications    MEDICATIONS ORDERED IN ED: Medications  cefTRIAXone  (ROCEPHIN ) 2 g in sodium chloride  0.9 % 100 mL IVPB (2 g Intravenous New Bag/Given 12/07/23 1454)  vancomycin  (VANCOCIN ) IVPB 1000 mg/200 mL premix (has no administration in time range)  acetaminophen  (TYLENOL ) tablet 650 mg (650 mg Oral Given 12/07/23 1429)  lidocaine -EPINEPHrine  (XYLOCAINE  W/EPI) 2 %-1:100000 (with pres) injection 20 mL (20 mLs Infiltration Given by Other 12/07/23 1439)  lactated ringers  bolus 1,000 mL (1,000 mLs Intravenous New Bag/Given 12/07/23 1455)     IMPRESSION / MDM / ASSESSMENT AND PLAN / ED COURSE  I reviewed the triage vital signs and the nursing notes.                              78 y.o. male with past medical history of hypertension, hyperlipidemia, and ascending aortic aneurysm who presents to the ED complaining of increasing pain and swelling around his right buttock for the past 6 days.  Patient's presentation is most consistent with acute presentation with potential threat to life or bodily function.  Differential diagnosis includes, but is not limited to, sepsis, abscess, cellulitis, Fournier's gangrene, perirectal abscess.  Patient nontoxic-appearing and in no acute distress, vital signs remarkable for fever and mildly elevated heart rate, but otherwise reassuring.  Sepsis workup initiated and patient does appear to have an abscess to his right buttock with surrounding cellulitis extending towards his posterior perineum.  No soft tissue gas concerning for Fournier's gangrene and abscess  does not appear to extend into his anus.  Bedside incision and drainage was performed with significant purulent drainage, will also start on IV antibiotics.  Labs show mild leukocytosis without significant anemia, electrolyte abnormality, or AKI.  LFTs are unremarkable, lactic acid pending.  Case discussed with hospitalist for admission.      FINAL CLINICAL IMPRESSION(S) / ED DIAGNOSES   Final diagnoses:  Sepsis without acute organ dysfunction, due to unspecified organism (HCC)  Abscess of buttock, right     Rx / DC Orders   ED Discharge Orders     None        Note:  This document was prepared using Dragon  voice recognition software and may include unintentional dictation errors.   Willo Dunnings, MD 12/07/23 1500

## 2023-12-07 NOTE — Assessment & Plan Note (Signed)
 Patient had increased heart rate, fever, source is cellulitis/abscess Blood cultures x 2 are in process Aerobic culture in process Continue with ceftriaxone  2 g IV daily and vancomycin  per pharmacy

## 2023-12-07 NOTE — ED Notes (Signed)
Wound culture sent

## 2023-12-07 NOTE — Assessment & Plan Note (Signed)
 Hydralazine 5 mg IV every 6 hours as needed for SBP > 175, 5 days ordered

## 2023-12-07 NOTE — ED Notes (Signed)
 Pt up to restroom, one person assist required

## 2023-12-08 ENCOUNTER — Inpatient Hospital Stay

## 2023-12-08 DIAGNOSIS — A419 Sepsis, unspecified organism: Secondary | ICD-10-CM | POA: Diagnosis not present

## 2023-12-08 DIAGNOSIS — L0390 Cellulitis, unspecified: Secondary | ICD-10-CM | POA: Diagnosis not present

## 2023-12-08 LAB — CBC
HCT: 35.6 % — ABNORMAL LOW (ref 39.0–52.0)
Hemoglobin: 11.8 g/dL — ABNORMAL LOW (ref 13.0–17.0)
MCH: 32.2 pg (ref 26.0–34.0)
MCHC: 33.1 g/dL (ref 30.0–36.0)
MCV: 97.3 fL (ref 80.0–100.0)
Platelets: 230 K/uL (ref 150–400)
RBC: 3.66 MIL/uL — ABNORMAL LOW (ref 4.22–5.81)
RDW: 11.9 % (ref 11.5–15.5)
WBC: 12.6 K/uL — ABNORMAL HIGH (ref 4.0–10.5)
nRBC: 0 % (ref 0.0–0.2)

## 2023-12-08 LAB — URINALYSIS, W/ REFLEX TO CULTURE (INFECTION SUSPECTED)
Bilirubin Urine: NEGATIVE
Glucose, UA: NEGATIVE mg/dL
Ketones, ur: NEGATIVE mg/dL
Leukocytes,Ua: NEGATIVE
Nitrite: NEGATIVE
Protein, ur: 30 mg/dL — AB
RBC / HPF: >50 RBC/hpf (ref 0–5)
Specific Gravity, Urine: 1.02 (ref 1.005–1.030)
Squamous Epithelial / HPF: 0 /HPF (ref 0–5)
pH: 5 (ref 5.0–8.0)

## 2023-12-08 LAB — BASIC METABOLIC PANEL WITH GFR
Anion gap: 8 (ref 5–15)
BUN: 15 mg/dL (ref 8–23)
CO2: 25 mmol/L (ref 22–32)
Calcium: 8.5 mg/dL — ABNORMAL LOW (ref 8.9–10.3)
Chloride: 100 mmol/L (ref 98–111)
Creatinine, Ser: 0.89 mg/dL (ref 0.61–1.24)
GFR, Estimated: >60 mL/min (ref >60)
Glucose, Bld: 107 mg/dL — ABNORMAL HIGH (ref 70–99)
Potassium: 4.9 mmol/L (ref 3.5–5.1)
Sodium: 133 mmol/L — ABNORMAL LOW (ref 135–145)

## 2023-12-08 MED ORDER — ASPIRIN 81 MG PO TBEC
81.0000 mg | DELAYED_RELEASE_TABLET | Freq: Every day | ORAL | Status: DC
  Administered 2023-12-08 – 2023-12-10 (×3): 81 mg via ORAL
  Filled 2023-12-08 (×3): qty 1

## 2023-12-08 MED ORDER — IOHEXOL 300 MG/ML  SOLN
100.0000 mL | Freq: Once | INTRAMUSCULAR | Status: AC | PRN
  Administered 2023-12-08: 100 mL via INTRAVENOUS

## 2023-12-08 MED ORDER — IOHEXOL 9 MG/ML PO SOLN
500.0000 mL | ORAL | Status: AC
  Administered 2023-12-08 (×2): 500 mL via ORAL

## 2023-12-08 MED ORDER — PRAVASTATIN SODIUM 20 MG PO TABS
40.0000 mg | ORAL_TABLET | Freq: Every day | ORAL | Status: DC
  Administered 2023-12-08 – 2023-12-09 (×2): 40 mg via ORAL
  Filled 2023-12-08 (×2): qty 2

## 2023-12-08 MED ORDER — LOSARTAN POTASSIUM 25 MG PO TABS
25.0000 mg | ORAL_TABLET | Freq: Every day | ORAL | Status: DC
  Administered 2023-12-08 – 2023-12-10 (×3): 25 mg via ORAL
  Filled 2023-12-08 (×3): qty 1

## 2023-12-08 MED ORDER — ADULT MULTIVITAMIN W/MINERALS CH
1.0000 | ORAL_TABLET | Freq: Every day | ORAL | Status: DC
  Administered 2023-12-08 – 2023-12-10 (×3): 1 via ORAL
  Filled 2023-12-08 (×3): qty 1

## 2023-12-08 NOTE — Plan of Care (Signed)

## 2023-12-08 NOTE — Progress Notes (Signed)
 Progress Note   Patient: Corey Williams FMW:[id number] DOB: Oct 05, 1945 DOA: 12/07/2023     1 DOS: the patient was seen and examined on 12/08/2023   Brief hospital course: [name] is a 78 year old male with history of hypertension, hyperlipidemia, presents emergency department from outpatient cystoscopy procedure for chief concerns of fever and right buttock abscess.  Vitals in the ED showed t of 101.3, rr 16, heart rate 93, blood pressure 127/67, SpO2 98% on room air.  Serum sodium is 132, potassium 4.2, chloride 94, bicarb 25, BUN of 17, serum creatinine 0.95, eGFR greater than 60, nonfasting blood glucose 97, WBC 10.7, hemoglobin 13.7, platelets of 260.  ED treatment: Acetaminophen  650 mg p.o. one-time dose, incision and drainage with lidocaine , ceftriaxone  2 g IV one-time dose, LR 1 L bolus, vancomycin  per pharmacy.  Assessment and Plan: * Sepsis due to right buttock abscess and cellulitis [facility]) Patient had increased heart rate, leukocytosis, fever, source is cellulitis/abscess Underwent I&D at bedside, cultures are pending CT of the abdomen pelvis with no drainable fluid collection Blood cultures x 2 are no growth Continue with ceftriaxone  2 g IV daily and vancomycin  per pharmacy   Leukocytosis Secondary to above  Essential hypertension Continue home losartan   Bladder lesion History of hematuria 1.1 x 2 cm lesion noted on CT abdomen pelvis.  Possible mass noted on previous CT scans and patient was scheduled to get a cystoscopy.  He will follow-up with urology.     Subjective: Continues to have some discomfort in right buttock.  Physical Exam: Vitals:   12/08/23 0412 12/08/23 0804 12/08/23 1358 12/08/23 1523  BP: (!) 119/57 126/88 (!) 124/51 (!) 130/57  Pulse: 78 82 81 77  Resp: 18 18 18 18   Temp: 99.7 F (37.6 C) 99 F (37.2 C) 97.6 F (36.4 C) 99.8 F (37.7 C)  TempSrc:   Oral Oral  SpO2: 94% 93% 95% 94%  Weight:      Height:       Physical Exam   Constitutional: In no distress.  Cardiovascular: Normal rate, regular rhythm. No lower extremity edema  Pulmonary: Non labored breathing on room air, no wheezing or rales.   Abdominal: Soft. Non distended and non tender Musculoskeletal: Normal range of motion.     Neurological: Alert and oriented to person, place, and time. Non focal  Skin: R buttock induration no purulent drainage, surrounding erythema, increased warmth  Data Reviewed:     Latest Ref Rng & Units 12/08/2023    3:57 AM 12/07/2023    2:06 PM 03/31/2023    8:28 AM  CBC  WBC 4.0 - 10.5 K/uL 12.6  10.7  5.3   Hemoglobin 13.0 - 17.0 g/dL 88.1  86.2  86.0   Hematocrit 39.0 - 52.0 % 35.6  41.2  41.7   Platelets 150 - 400 K/uL 230  260  231.0       Latest Ref Rng & Units 12/08/2023    3:57 AM 12/07/2023    2:06 PM 03/31/2023    8:28 AM  BMP  Glucose 70 - 99 mg/dL 892  97  99   BUN 8 - 23 mg/dL 15  17  12    Creatinine 0.61 - 1.24 mg/dL 9.10  9.04  9.05   Sodium 135 - 145 mmol/L 133  132  141   Potassium 3.5 - 5.1 mmol/L 4.9  4.2  4.5   Chloride 98 - 111 mmol/L 100  94  104   CO2 22 - 32  mmol/L 25  25  31    Calcium 8.9 - 10.3 mg/dL 8.5  9.1  9.6      Family Communication: Spouse at bedside  Disposition: Status is: Inpatient Remains inpatient appropriate because: IV antibiotics  Planned Discharge Destination: Home    Time spent: 35 minutes  Author: Alban Pepper, MD 12/08/2023 6:31 PM  For on call review [email or web address].

## 2023-12-08 NOTE — Plan of Care (Signed)
  Problem: Education: Goal: Knowledge of General Education information will improve Description: Including pain rating scale, medication(s)/side effects and non-pharmacologic comfort measures Outcome: Progressing   Problem: Activity: Goal: Risk for activity intolerance will decrease Outcome: Progressing   Problem: Pain Managment: Goal: General experience of comfort will improve and/or be controlled Outcome: Progressing

## 2023-12-09 ENCOUNTER — Other Ambulatory Visit (HOSPITAL_COMMUNITY): Payer: Self-pay

## 2023-12-09 ENCOUNTER — Telehealth (HOSPITAL_COMMUNITY): Payer: Self-pay | Admitting: Pharmacy Technician

## 2023-12-09 DIAGNOSIS — L0390 Cellulitis, unspecified: Secondary | ICD-10-CM | POA: Diagnosis not present

## 2023-12-09 DIAGNOSIS — A419 Sepsis, unspecified organism: Secondary | ICD-10-CM | POA: Diagnosis not present

## 2023-12-09 LAB — CBC WITH DIFFERENTIAL/PLATELET
Abs Immature Granulocytes: 0.04 K/uL (ref 0.00–0.07)
Basophils Absolute: 0 K/uL (ref 0.0–0.1)
Basophils Relative: 1 %
Eosinophils Absolute: 0.5 K/uL (ref 0.0–0.5)
Eosinophils Relative: 6 %
HCT: 35.3 % — ABNORMAL LOW (ref 39.0–52.0)
Hemoglobin: 11.8 g/dL — ABNORMAL LOW (ref 13.0–17.0)
Immature Granulocytes: 1 %
Lymphocytes Relative: 10 %
Lymphs Abs: 0.8 K/uL (ref 0.7–4.0)
MCH: 32.4 pg (ref 26.0–34.0)
MCHC: 33.4 g/dL (ref 30.0–36.0)
MCV: 97 fL (ref 80.0–100.0)
Monocytes Absolute: 0.6 K/uL (ref 0.1–1.0)
Monocytes Relative: 7 %
Neutro Abs: 6.6 K/uL (ref 1.7–7.7)
Neutrophils Relative %: 75 %
Platelets: 254 K/uL (ref 150–400)
RBC: 3.64 MIL/uL — ABNORMAL LOW (ref 4.22–5.81)
RDW: 11.7 % (ref 11.5–15.5)
WBC: 8.6 K/uL (ref 4.0–10.5)
nRBC: 0 % (ref 0.0–0.2)

## 2023-12-09 LAB — URINE CULTURE: Culture: NO GROWTH

## 2023-12-09 LAB — AEROBIC CULTURE W GRAM STAIN (SUPERFICIAL SPECIMEN): Gram Stain: NONE SEEN

## 2023-12-09 LAB — CREATININE, SERUM
Creatinine, Ser: 0.9 mg/dL (ref 0.61–1.24)
GFR, Estimated: >60 mL/min (ref >60)

## 2023-12-09 MED ORDER — LINEZOLID 600 MG PO TABS
600.0000 mg | ORAL_TABLET | Freq: Two times a day (BID) | ORAL | Status: DC
  Administered 2023-12-09 – 2023-12-10 (×2): 600 mg via ORAL
  Filled 2023-12-09 (×2): qty 1

## 2023-12-09 NOTE — Progress Notes (Addendum)
 [name]

## 2023-12-09 NOTE — Telephone Encounter (Signed)
 Pharmacy Patient Advocate Encounter  Insurance verification completed.    The patient is insured through [employer]. Patient has Medicare and is not eligible for a copay card, but may be able to apply for patient assistance or Medicare RX Payment Plan (Patient Must reach out to their plan, if eligible for payment plan), if available.    Ran test claim for Linezolid  600mg  tablets and the current 7 day co-pay is $17.84.   This test claim was processed through  [facility]- copay amounts may vary at other pharmacies due to pharmacy/plan contracts, or as the patient moves through the different stages of their insurance plan.

## 2023-12-09 NOTE — Progress Notes (Addendum)
 Progress Note   Patient: Corey Williams FMW:[id number] DOB: Aug 23, 1945 DOA: 12/07/2023     2 DOS: the patient was seen and examined on 12/09/2023   Brief hospital course: Mr. Damione Robideau is a 78 year old male with history of hypertension, hyperlipidemia, presents emergency department from outpatient cystoscopy procedure for chief concerns of fever and right buttock abscess.  Vitals in the ED showed t of 101.3, rr 16, heart rate 93, blood pressure 127/67, SpO2 98% on room air.  Serum sodium is 132, potassium 4.2, chloride 94, bicarb 25, BUN of 17, serum creatinine 0.95, eGFR greater than 60, nonfasting blood glucose 97, WBC 10.7, hemoglobin 13.7, platelets of 260.  ED treatment: Acetaminophen  650 mg p.o. one-time dose, incision and drainage with lidocaine , ceftriaxone  2 g IV one-time dose, LR 1 L bolus, vancomycin  per pharmacy.  Assessment and Plan: * Sepsis due to right buttock abscess and cellulitis (HCC) Resolved  Patient had increased heart rate, leukocytosis, fever, source is cellulitis/abscess Underwent I&D at bedside, cultures with MRSA  CT of the abdomen pelvis with no drainable fluid collection Blood cultures x 2 are no growth Transition to linezolid    Mild Hyponatremia  POA Likely due to hypovolemia in the setting of sepsis.  This resolved by the time of discharge.    Latest Ref Rng & Units 12/10/2023    7:15 AM 12/09/2023    8:40 AM 12/08/2023    3:57 AM  BMP  Glucose 70 - 99 mg/dL 96   892   BUN 8 - 23 mg/dL 13   15   Creatinine 9.38 - 1.24 mg/dL 9.17  9.09  9.10   Sodium 135 - 145 mmol/L 138   133   Potassium 3.5 - 5.1 mmol/L 4.0   4.9   Chloride 98 - 111 mmol/L 102   100   CO2 22 - 32 mmol/L 26   25   Calcium 8.9 - 10.3 mg/dL 8.4   8.5     Leukocytosis Resolved   Essential hypertension Continue home losartan   Bladder lesion History of hematuria 1.1 x 2 cm lesion noted on CT abdomen pelvis, concerning for urothelial malignancy per radiology possible mass noted  on previous CT scans and patient was scheduled to get a cystoscopy.  He will follow-up with urology.     Subjective: No issues overnight  Physical Exam: Vitals:   12/08/23 1949 12/09/23 0408 12/09/23 0801 12/09/23 1651  BP: (!) 110/54 (!) 138/56 117/69 (!) 122/56  Pulse: 81 72 70 74  Resp: 18 18 16 17   Temp: 99.9 F (37.7 C) 99.8 F (37.7 C) 98.7 F (37.1 C) 99.8 F (37.7 C)  TempSrc:  Oral    SpO2: 94% 100% 96% 96%  Weight:      Height:       Physical Exam  Physical Exam  Constitutional: In no distress.  Cardiovascular: Normal rate, regular rhythm. No lower extremity edema  Pulmonary: Non labored breathing on room air, no wheezing or rales.   Abdominal: Soft. Non distended and non tender Musculoskeletal: Normal range of motion.     Neurological: Alert and oriented to person, place, and time. Non focal  Skin: R buttock wound with sanguinous drainage, mild erythema around incision site, indurated, mild increased WTT     Data Reviewed:     Latest Ref Rng & Units 12/09/2023    8:40 AM 12/08/2023    3:57 AM 12/07/2023    2:06 PM  CBC  WBC 4.0 - 10.5 K/uL 8.6  12.6  10.7   Hemoglobin 13.0 - 17.0 g/dL 88.1  88.1  86.2   Hematocrit 39.0 - 52.0 % 35.3  35.6  41.2   Platelets 150 - 400 K/uL 254  230  260       Latest Ref Rng & Units 12/09/2023    8:40 AM 12/08/2023    3:57 AM 12/07/2023    2:06 PM  BMP  Glucose 70 - 99 mg/dL  892  97   BUN 8 - 23 mg/dL  15  17   Creatinine 9.38 - 1.24 mg/dL 9.09  9.10  9.04   Sodium 135 - 145 mmol/L  133  132   Potassium 3.5 - 5.1 mmol/L  4.9  4.2   Chloride 98 - 111 mmol/L  100  94   CO2 22 - 32 mmol/L  25  25   Calcium 8.9 - 10.3 mg/dL  8.5  9.1      Family Communication: Spouse at bedside  Disposition: Status is: Inpatient Remains inpatient appropriate because:Monitor off IV antibiotics   Planned Discharge Destination: Home    Time spent: 35 minutes  Author: Alban Pepper, MD 12/09/2023 6:35 PM  For on call review  [email or web address].

## 2023-12-09 NOTE — Consult Note (Signed)
 WOC Nurse Consult Note: Reason for Consult: requested to assess a wound on R buttock. Wound type: Abscess. Full thickness. Pressure Injury POA: NA Measurement: opening aprox. 1.5 cm x 0.2 cm. Wound bed: not able to see.  Addendum: fever according to Dr Willo notes. Drainage (amount, consistency, odor) None according to notes. Periwound: local erythema, edema. Dressing procedure/placement/frequency: Cleanse the area with saline, pat dry the peri-wound skin. Apply foam dressing to absorb any drainage and protect the area. Change every 3 days or PRN.  WOC team will not plan to follow further. Please reconsult if further assistance is needed. Thank-you,  Lela Holm RN, CNS, [employer], MSN.  (Phone [phone])

## 2023-12-10 ENCOUNTER — Other Ambulatory Visit: Payer: Self-pay

## 2023-12-10 DIAGNOSIS — L0390 Cellulitis, unspecified: Secondary | ICD-10-CM | POA: Diagnosis not present

## 2023-12-10 DIAGNOSIS — A419 Sepsis, unspecified organism: Secondary | ICD-10-CM | POA: Diagnosis not present

## 2023-12-10 DIAGNOSIS — L0231 Cutaneous abscess of buttock: Secondary | ICD-10-CM | POA: Diagnosis not present

## 2023-12-10 LAB — BASIC METABOLIC PANEL WITH GFR
Anion gap: 10 (ref 5–15)
BUN: 13 mg/dL (ref 8–23)
CO2: 26 mmol/L (ref 22–32)
Calcium: 8.4 mg/dL — ABNORMAL LOW (ref 8.9–10.3)
Chloride: 102 mmol/L (ref 98–111)
Creatinine, Ser: 0.82 mg/dL (ref 0.61–1.24)
GFR, Estimated: >60 mL/min (ref >60)
Glucose, Bld: 96 mg/dL (ref 70–99)
Potassium: 4 mmol/L (ref 3.5–5.1)
Sodium: 138 mmol/L (ref 135–145)

## 2023-12-10 LAB — CBC
HCT: 32.2 % — ABNORMAL LOW (ref 39.0–52.0)
Hemoglobin: 10.8 g/dL — ABNORMAL LOW (ref 13.0–17.0)
MCH: 32.5 pg (ref 26.0–34.0)
MCHC: 33.5 g/dL (ref 30.0–36.0)
MCV: 97 fL (ref 80.0–100.0)
Platelets: 274 K/uL (ref 150–400)
RBC: 3.32 MIL/uL — ABNORMAL LOW (ref 4.22–5.81)
RDW: 11.7 % (ref 11.5–15.5)
WBC: 7.8 K/uL (ref 4.0–10.5)
nRBC: 0 % (ref 0.0–0.2)

## 2023-12-10 MED ORDER — ACETAMINOPHEN 325 MG PO TABS: 650.0000 mg | ORAL_TABLET | Freq: Four times a day (QID) | ORAL | Status: AC | PRN

## 2023-12-10 MED ORDER — LINEZOLID 600 MG PO TABS
600.0000 mg | ORAL_TABLET | Freq: Two times a day (BID) | ORAL | 0 refills | Status: AC
  Filled 2023-12-10: qty 9, 5d supply, fill #0

## 2023-12-10 NOTE — Plan of Care (Signed)

## 2023-12-10 NOTE — Discharge Instructions (Addendum)
 Please call [facility] and schedule an appointment [phone] for the bladder mass that was seen on the imaging obtained here at the hospital.   Please take all of your antibiotics. Please follow up with your PCP in 1 week to ensure your infection is improving. If you develop fever or chills please call your primary care doctor, you may need to come back to the hospital.      [facility] your hands with soap and water for at least 20 seconds before and after you change your bandage (dressing). If soap and water are not available, use hand sanitizer. Change your dressing and any packing as told by your provider. If the dressing is dry or stuck when you try to remove it, moisten or wet it with saline or water. This will help you remove it without harming your skin or tissues. If your wound is packed, leave it in place until your provider tells you to remove it. To remove it, moisten or wet the packing with saline or water. Leave stitches (sutures), skin glue, or tape strips in place. These skin closures may need to stay in place for 2 weeks or longer. If tape strip edges start to loosen and curl up, you may trim the loose edges. Do not remove tape strips completely unless your provider tells you to do that. Check your wound every day for signs of infection. Check for: More redness, swelling, or pain. More fluid or blood. Warmth. Pus or a bad smell. If you were sent home with a drain tube in place, follow instructions from your provider about: How to empty it. How to care for it at home. Be careful when you get rid of used dressings, wound packing, or drainage. Activity Rest the affected area. Return to your normal activities as told by your provider. Ask your provider what activities are safe for you. General instructions Do not use any products that contain nicotine or tobacco. These products include cigarettes, chewing tobacco, and vaping devices, such as e-cigarettes. These can delay  incision healing after surgery. If you need help quitting, ask your provider. Do not take baths, swim, or use a hot tub until your provider approves. Ask your provider if you may take showers. You may only be allowed to take sponge baths. The incision will keep draining. It is normal to have some clear or slightly bloody drainage. The amount of drainage should go down each day. Keep all follow-up visits. Your provider will need to make sure that your incision is healing well and that there are no problems. Your health care provider may give you more instructions. Make sure you know what you can and cannot do Contact a health care provider if: Your cyst or abscess comes back. You have any signs of infection. You notice red streaks that spread away from the incision site. You have a fever or chills. Get help right away if: You have severe pain or bleeding. You become short of breath. You have chest pain. You have signs of a severe infection. You may notice changes in your incision area, such as: Swelling that makes the skin feel hard. Numbness or tingling. Sudden increase in redness. Your skin color may change from red to purple, and then to dark spots. Blisters, ulcers, or splitting of the skin.

## 2023-12-10 NOTE — Plan of Care (Signed)

## 2023-12-10 NOTE — Progress Notes (Signed)
   12/10/23  CC:  Chief Complaint  Patient presents with   Cysto    HPI: Refer to my prior note 11/09/2023.  Complaining of significant pain right buttock.  On exam large area of erythema and fluctuance right buttock without drainage  Blood pressure (!) 177/74, pulse 97, height 6' (1.829 m), weight 212 lb (96.2 kg). NED. A&Ox3.   No respiratory distress   Abd soft, NT, ND Subcoronal hypospadias with a pinpoint urethral meatus; unable to perform cystoscopy   Assessment/ Plan: Unable to perform cystoscopy secondary to urethral stricture Large buttock abscess in did not recommend dilation in office. He was transported to the ED for further evaluation of his abscess Will need follow-up dilation of urethral stricture and cystoscopy either in office or in OR depending on his preference    Corey [name], MD

## 2023-12-10 NOTE — Progress Notes (Signed)
 Reviewed discharge instructions with patient. Patient acknowledged understanding. Patient received meds to bedside. Patient discharged with personal belongings. Patient wheeled out by staff. Patient transported home via family vehicle. No distress noted in patient.

## 2023-12-12 LAB — CULTURE, BLOOD (ROUTINE X 2)
Culture: NO GROWTH
Culture: NO GROWTH
Special Requests: ADEQUATE

## 2023-12-13 ENCOUNTER — Telehealth: Payer: Self-pay

## 2023-12-13 NOTE — Transitions of Care (Post Inpatient/ED Visit) (Signed)
   12/13/2023  Name: Corey Williams MRN: [id number] DOB: 27-Feb-1946  Today's TOC FU Call Status: Today's TOC FU Call Status:: Unsuccessful Call (1st Attempt) Unsuccessful Call (1st Attempt) Date: 12/13/23  Attempted to reach the patient regarding the most recent Inpatient/ED visit.  Follow Up Plan: Additional outreach attempts will be made to reach the patient to complete the Transitions of Care (Post Inpatient/ED visit) call.   Arvin Seip RN, BSN, CCM [employer], Population Health Case Manager Phone: [phone]

## 2023-12-14 ENCOUNTER — Telehealth: Payer: Self-pay

## 2023-12-14 NOTE — Patient Instructions (Signed)
 Visit Information  Thank you for taking time to visit with me today. Please don't hesitate to contact me if I can be of assistance to you   Patient instructions: Complete antibiotic as prescribed Notify your provider for any ongoing / new concerns.  Change dressing as recommended by provider   Patient verbalizes understanding of instructions and care plan provided today and agrees to view in MyChart. Active MyChart status and patient understanding of how to access instructions and care plan via MyChart confirmed with patient.     The patient has been provided with contact information for the care management team and has been advised to call with any health related questions or concerns.   Please call the care guide team at [phone] if you need to cancel or reschedule your appointment.   Please call the Suicide and Crisis Lifeline: 988 call the [place]  National Suicide Prevention Lifeline: 332-741-7074 or TTY: 681 020 8876 TTY 606-744-6668) to talk to a trained counselor call 1-800-273-TALK (toll free, 24 hour hotline) if you are experiencing a Mental Health or Behavioral Health Crisis or need someone to talk to.  Arvin Seip RN, BSN, CCM [employer], Population Health Case Manager Phone: [phone]

## 2023-12-14 NOTE — Transitions of Care (Post Inpatient/ED Visit) (Signed)
 12/14/2023  Name: Corey Williams MRN: [id number] DOB: 01/01/1946  Today's TOC FU Call Status: Today's TOC FU Call Status:: Successful TOC FU Call Completed TOC FU Call Complete Date: 12/14/23 Patient's Name and Date of Birth confirmed.  Transition Care Management Follow-up Telephone Call Date of Discharge: 12/10/23 Discharge Facility: [facility]) Type of Discharge: Inpatient Admission Primary Inpatient Discharge Diagnosis:: sepsis due to right buttock abscess and cellulitis How have you been since you were released from the hospital?: Better Any questions or concerns?: No  Items Reviewed: Did you receive and understand the discharge instructions provided?: Yes Medications obtained,verified, and reconciled?: Yes (Medications Reviewed) Any new allergies since your discharge?: No Dietary orders reviewed?: Yes Type of Diet Ordered:: regular diet per patient. Do you have support at home?: Yes People in Home [RPT]: spouse Name of Support/Comfort Primary Source: Rickie Gange  Medications Reviewed Today: Medications Reviewed Today     Reviewed by Colbi Staubs E, RN (Registered Nurse) on 12/14/23 at 1516  Med List Status: <None>   Medication Order Taking? Sig Documenting Provider Last Dose Status Informant  acetaminophen  (TYLENOL ) 325 MG tablet [id number] Yes Take 2 tablets (650 mg total) by mouth every 6 (six) hours as needed for mild pain (pain score 1-3) or fever (or Fever >/= 101). Franchot Novel, MD  Active   aspirin  81 MG tablet [id number] Yes Take 81 mg by mouth daily. [provider]  Active Self  Calcium Carbonate (CALCIUM 600 PO) [id number]  Take 1 tablet by mouth daily. [provider]  Active Self  ibuprofen (ADVIL,MOTRIN) 200 MG tablet [id number] Yes Take 200 mg by mouth every 6 (six) hours as needed. [provider]  Active Self           Med Note DELPHIA, BRENDA L   Fri Feb 05, 2023  8:19 AM) prn  linezolid  (ZYVOX ) 600  MG tablet [id number] Yes Take 1 tablet (600 mg total) by mouth every 12 (twelve) hours for 9 doses. Franchot Novel, MD  Active   losartan  (COZAAR ) 25 MG tablet [id number] Yes Take 25 mg by mouth daily. [provider]  Active Self  Multiple Vitamin (MULTIVITAMIN) tablet [id number] Yes Take 1 tablet by mouth daily. [provider]  Active Self  pravastatin  (PRAVACHOL ) 40 MG tablet [id number] Yes Take 1 tablet by mouth once daily Copland, Spencer, MD  Active Self            Home Care and Equipment/Supplies: Were Home Health Services Ordered?: No Any new equipment or medical supplies ordered?: No  Functional Questionnaire: Do you need assistance with bathing/showering or dressing?: No Do you need assistance with meal preparation?: No Do you need assistance with eating?: No Do you have difficulty maintaining continence: No Do you need assistance with getting out of bed/getting out of a chair/moving?: No Do you have difficulty managing or taking your medications?: No  Follow up appointments reviewed: PCP Follow-up appointment confirmed?: Yes Date of PCP follow-up appointment?: 12/15/23 Follow-up Provider: Dr. Jacques copeland Specialist [facility] Follow-up appointment confirmed?: NA Do you need transportation to your follow-up appointment?: No Do you understand care options if your condition(s) worsen?: Yes-patient verbalized understanding  SDOH Interventions Today    Flowsheet Row Most Recent Value  SDOH Interventions   Food Insecurity Interventions Intervention Not Indicated  Housing Interventions Intervention Not Indicated  Transportation Interventions Intervention Not Indicated  Utilities Interventions Intervention Not Indicated   Discussed and offered 30 day TOC program.  Patient declines.  The patient  has been provided with contact information for the care management team and has been advised to call with any health -related questions or concerns.  The patient  verbalized understanding with current plan of care.  The patient is directed to their insurance card regarding availability of benefits coverage.    Arvin Seip RN, BSN, CCM [employer], Population Health Case Manager Phone: [phone]

## 2023-12-14 NOTE — Progress Notes (Unsigned)
     Corey Williams T. Corey Darty, MD, CAQ Sports Medicine [facility] at [facility] [address] [place], [address]  Phone: [phone]  FAX: [phone]  Corey Williams - 78 y.o. male  MRN [id number]  Date of Birth: 16-Sep-1945  Date: 12/15/2023  PCP: Watt Mirza, MD  Referral: Watt Mirza, MD  No chief complaint on file.  Subjective:   Corey Williams is a 78 y.o. very pleasant male patient with There is no height or weight on file to calculate BMI. who presents with the following:  Discussed the use of AI scribe software for clinical note transcription with the patient, who gave verbal consent to proceed.  Admit date:     12/07/2023  Discharge date: 12/10/2023   Patient was admitted on the above dates with sepsis due to cellulitis.  There also was concern about a possible bladder mass seen on imaging. 1 x 2 cm enhancing lesion in the right posterior bladder wall next to the right UVJ concerning for urothelial neoplasm.  He came to the hospital after his cystoscopy with concerns of fever and right buttock abscess he was admitted with code sepsis.  He had an I&D on bedside, cultures were positive for MRSA. Linezolid  after initial treatment with vancomycin  in the hospital.  He also got Rocephin  2 g   History of Present Illness     Review of Systems is noted in the HPI, as appropriate  Objective:   There were no vitals taken for this visit.  GEN: No acute distress; alert,appropriate. PULM: Breathing comfortably in no respiratory distress PSYCH: Normally interactive.   Physical Exam   Laboratory and Imaging Data:  Assessment and Plan:   No diagnosis found. Assessment & Plan   Medication Management during today's office visit: No orders of the defined types were placed in this encounter.  There are no discontinued medications.  Orders placed today for conditions managed today: No orders of the defined types were placed in this  encounter.   Disposition: No follow-ups on file.  [employer] speech-to-text software was used for transcription in this dictation.  Possible transcriptional errors can occur using Animal nutritionist.   Signed,  Corey Williams. Corey Tancredi, MD   Outpatient Encounter Medications as of 12/15/2023  Medication Sig   acetaminophen  (TYLENOL ) 325 MG tablet Take 2 tablets (650 mg total) by mouth every 6 (six) hours as needed for mild pain (pain score 1-3) or fever (or Fever >/= 101).   aspirin  81 MG tablet Take 81 mg by mouth daily.   Calcium Carbonate (CALCIUM 600 PO) Take 1 tablet by mouth daily.   ibuprofen (ADVIL,MOTRIN) 200 MG tablet Take 200 mg by mouth every 6 (six) hours as needed. (Patient not taking: Reported on 12/07/2023)   linezolid  (ZYVOX ) 600 MG tablet Take 1 tablet (600 mg total) by mouth every 12 (twelve) hours for 9 doses.   losartan  (COZAAR ) 25 MG tablet Take 25 mg by mouth daily.   Multiple Vitamin (MULTIVITAMIN) tablet Take 1 tablet by mouth daily.   pravastatin  (PRAVACHOL ) 40 MG tablet Take 1 tablet by mouth once daily   No facility-administered encounter medications on file as of 12/15/2023.

## 2023-12-14 NOTE — Discharge Summary (Signed)
 Physician Discharge Summary   Patient: Corey Williams MRN: [id number] DOB: 1946/01/07  Admit date:     12/07/2023  Discharge date: 12/10/2023  Discharge Physician: Alban Pepper   PCP: Watt Mirza, MD   Recommendations at discharge:    F/u with urology about possible bladder mass seeen on imaging.  Discharge Diagnoses: Principal Problem:   Sepsis due to cellulitis [facility]) Active Problems:   Cellulitis   Essential hypertension   Leukocytosis   Hereditary essential tremor   Aneurysm of ascending aorta  Resolved Problems:   * No resolved hospital problems. [facility] Course: Corey Williams is a 78 year old male with history of hypertension, hyperlipidemia, presents emergency department from outpatient cystoscopy procedure for chief concerns of fever and right buttock abscess.  Vitals in the ED showed t of 101.3, rr 16, heart rate 93, blood pressure 127/67, SpO2 98% on room air.  Serum sodium is 132, potassium 4.2, chloride 94, bicarb 25, BUN of 17, serum creatinine 0.95, eGFR greater than 60, nonfasting blood glucose 97, WBC 10.7, hemoglobin 13.7, platelets of 260.  ED treatment: Acetaminophen  650 mg p.o. one-time dose, incision and drainage with lidocaine , ceftriaxone  2 g IV one-time dose, LR 1 L bolus, vancomycin  per pharmacy.  Assessment and Plan: * Sepsis due to right buttock abscess and cellulitis [facility]) Resolved  Patient had increased heart rate, leukocytosis, fever, source is cellulitis/abscess Underwent I&D at bedside, cultures with MRSA  CT of the abdomen pelvis with no drainable fluid collection Blood cultures x 2 are no growth Transition to linezolid  on discharge   Mild hyponatremia POA    Latest Ref Rng & Units 12/10/2023    7:15 AM 12/09/2023    8:40 AM 12/08/2023    3:57 AM  BMP  Glucose 70 - 99 mg/dL 96   892   BUN 8 - 23 mg/dL 13   15   Creatinine 9.38 - 1.24 mg/dL 9.17  9.09  9.10   Sodium 135 - 145 mmol/L 138   133   Potassium 3.5 - 5.1 mmol/L  4.0   4.9   Chloride 98 - 111 mmol/L 102   100   CO2 22 - 32 mmol/L 26   25   Calcium 8.9 - 10.3 mg/dL 8.4   8.5    Likely due to hypovolemia in setting of sepsis.  Resolved by discharge. .  Essential hypertension Hydralazine  5 mg IV every 6 hours as needed for SBP > 175, 5 days ordered  Essential hypertension Continue home losartan    Bladder lesion History of hematuria 1.1 x 2 cm lesion noted on CT abdomen pelvis, concerning for urothelial malignancy per radiology possible mass noted on previous CT scans and patient was scheduled to get a cystoscopy.  He will follow-up with urology.      Consultants: Surgery  Procedures performed: I&D  Disposition: Home Diet recommendation:  Discharge Diet Orders (From admission, onward)     Start     Ordered   12/10/23 0000  Diet - low sodium heart healthy        12/10/23 1242           Regular diet DISCHARGE MEDICATION: Allergies as of 12/10/2023   No Known Allergies      Medication List     TAKE these medications    acetaminophen  325 MG tablet Commonly known as: TYLENOL  Take 2 tablets (650 mg total) by mouth every 6 (six) hours as needed for mild pain (pain score 1-3) or fever (or Fever >/=  101).   aspirin  81 MG tablet Take 81 mg by mouth daily.   CALCIUM 600 PO Take 1 tablet by mouth daily.   ibuprofen 200 MG tablet Commonly known as: ADVIL Take 200 mg by mouth every 6 (six) hours as needed.   linezolid  600 MG tablet Commonly known as: ZYVOX  Take 1 tablet (600 mg total) by mouth every 12 (twelve) hours for 9 doses.   losartan  25 MG tablet Commonly known as: COZAAR  Take 25 mg by mouth daily.   multivitamin tablet Take 1 tablet by mouth daily.   pravastatin  40 MG tablet Commonly known as: PRAVACHOL  Take 1 tablet by mouth once daily               Discharge Care Instructions  (From admission, onward)           Start     Ordered   12/10/23 0000  Discharge wound care:       Comments: R  buttock: Cleanse the area with warm water and soap in the shower, pat dry the skin dry around the wound. Apply foam dressing to absorb drainage. Change daily or more frequently if needed.   12/10/23 1242            Follow-up Information     Copland, Spencer, MD. Schedule an appointment as soon as possible for a visit in 1 week(s).   Specialties: Family Medicine, Sports Medicine Contact information: [address] [place] [address] [phone]         Carolee Sherwood JONETTA DOUGLAS, MD. Schedule an appointment as soon as possible for a visit on 12/30/2023.   Specialty: Urology Why: Appt @ 9:15 Contact information: [address] [place] [address] [phone]                Discharge Exam: Corey Williams   12/07/23 1354  Weight: 96 kg   Physical Exam  Constitutional: In no distress.  Cardiovascular: Normal rate, regular rhythm. No lower extremity edema  Pulmonary: Non labored breathing on room air, no wheezing or rales.   Abdominal: Soft. Non distended and non tender Musculoskeletal: Normal range of motion.     Neurological: Alert and oriented to person, place, and time. Non focal  Skin: R buttock, continues to have small amount of purulent drainage, induration and erythema, improved   Condition at discharge: improving  The results of significant diagnostics from this hospitalization (including imaging, microbiology, ancillary and laboratory) are listed below for reference.   Imaging Studies: CT ABDOMEN PELVIS W CONTRAST Result Date: 12/08/2023 CLINICAL DATA:  Right buttock abscess. EXAM: CT ABDOMEN AND PELVIS WITH CONTRAST TECHNIQUE: Multidetector CT imaging of the abdomen and pelvis was performed using the standard protocol following bolus administration of intravenous contrast. RADIATION DOSE REDUCTION: This exam was performed according to the departmental dose-optimization program which includes automated exposure control, adjustment of the mA and/or  kV according to patient size and/or use of iterative reconstruction technique. CONTRAST:  OMNIPAQUE  IOHEXOL  300 MG/ML  SOLN COMPARISON:  CT abdomen pelvis dated 10/17/2022. FINDINGS: Lower chest: Left lung base linear atelectasis/scarring. The visualized lung bases are otherwise clear. No intra-abdominal free air or free fluid. Hepatobiliary: Small liver cyst. No biliary dilatation. The gallbladder is unremarkable Pancreas: Unremarkable. No pancreatic ductal dilatation or surrounding inflammatory changes. Spleen: Normal in size without focal abnormality. Adrenals/Urinary Tract: The adrenal glands unremarkable. Small bilateral renal cysts as seen previously. There is no hydronephrosis on either side. There is symmetric enhancement and excretion  of contrast by both kidneys. The visualized ureters appear unremarkable. There is a 1.1 x 2.0 cm enhancing lesion arising from the right posterior bladder wall adjacent to the right UVJ most consistent with a urothelial neoplasm. Further evaluation with cystoscopy is recommended. Stomach/Bowel: There is sigmoid diverticulosis without active inflammatory changes. There is no bowel obstruction or active inflammation. The appendix is normal. Vascular/Lymphatic: Mild aortoiliac atherosclerotic disease. Apparent low attenuating content in the right common femoral vein, likely artifactual and related to mixing of blood and contrast. DVT is less likely but not excluded. Clinical correlation is recommended. Duplex ultrasound may provide better evaluation if there is clinical concern for DVT. The IVC is unremarkable. No portal venous gas. No adenopathy. Reproductive: The prostate and seminal vesicles are grossly unremarkable. Other: Small fat containing bilateral inguinal and umbilical hernias. Musculoskeletal: Osteopenia with degenerative changes of the spine. No acute osseous pathology. There is diffuse edema and stranding of the subcutaneous soft tissues of the medial right  buttock and perianal region. No drainable fluid collection or abscess. No soft tissue gas. IMPRESSION: 1. Right buttock cellulitis. No drainable fluid collection or abscess. 2. A 1.1 x 2.0 cm enhancing lesion arising from the right posterior bladder wall adjacent to the right UVJ most consistent with a urothelial neoplasm. Further evaluation with cystoscopy is recommended. 3. Sigmoid diverticulosis. No bowel obstruction. Normal appendix. 4.  Aortic Atherosclerosis (ICD10-I70.0). Electronically Signed   By: Vanetta Chou M.D.   On: 12/08/2023 17:21    Microbiology: Results for orders placed or performed during the hospital encounter of 12/07/23  Culture, blood (Routine x 2)     Status: None   Collection Time: 12/07/23  2:06 PM   Specimen: BLOOD  Result Value Ref Range Status   Specimen Description BLOOD RIGHT ARM  Final   Special Requests   Final    BOTTLES DRAWN AEROBIC AND ANAEROBIC Blood Culture results may not be optimal due to an inadequate volume of blood received in culture bottles   Culture   Final    NO GROWTH 5 DAYS Performed at [facility], [address], [place], [place] [address]    Report Status 12/12/2023 FINAL  Final  Culture, blood (Routine x 2)     Status: None   Collection Time: 12/07/23  2:06 PM   Specimen: BLOOD  Result Value Ref Range Status   Specimen Description BLOOD LEFT ARM  Final   Special Requests   Final    BOTTLES DRAWN AEROBIC AND ANAEROBIC Blood Culture adequate volume   Culture   Final    NO GROWTH 5 DAYS Performed at [facility], [address] [place], [place] [address]    Report Status 12/12/2023 FINAL  Final  Aerobic Culture w Gram Stain (superficial specimen)     Status: None   Collection Time: 12/07/23  2:06 PM   Specimen: Abscess  Result Value Ref Range Status   Specimen Description   Final    ABSCESS Performed at [facility], [address], [place], [place] [address]    Special Requests   Final     NONE Performed at [facility], [address] [place], [place] [address]    Gram Stain   Final    NO WBC SEEN RARE GRAM POSITIVE COCCI Performed at [facility], [address], [place], [place] [address]    Culture   Final    ABUNDANT METHICILLIN RESISTANT STAPHYLOCOCCUS AUREUS   Report Status 12/09/2023 FINAL  Final   Organism ID, Bacteria METHICILLIN RESISTANT STAPHYLOCOCCUS AUREUS  Final      Susceptibility   Methicillin resistant staphylococcus aureus - MIC*    CIPROFLOXACIN  4 RESISTANT Resistant     ERYTHROMYCIN >=8 RESISTANT Resistant     GENTAMICIN <=0.5 SENSITIVE Sensitive     OXACILLIN >=4 RESISTANT Resistant     TETRACYCLINE <=1 SENSITIVE Sensitive     VANCOMYCIN  1 SENSITIVE Sensitive     TRIMETH /SULFA  160 RESISTANT Resistant     CLINDAMYCIN <=0.25 SENSITIVE Sensitive     RIFAMPIN <=0.5 SENSITIVE Sensitive     Inducible Clindamycin NEGATIVE Sensitive     LINEZOLID  2 SENSITIVE Sensitive     * ABUNDANT METHICILLIN RESISTANT STAPHYLOCOCCUS AUREUS  Urine Culture     Status: None   Collection Time: 12/08/23  6:25 AM   Specimen: Urine, Random  Result Value Ref Range Status   Specimen Description   Final    URINE, RANDOM Performed at [facility], [address], [place], [place] [address]    Special Requests   Final    NONE Reflexed from [id number] Performed at [facility], [address], [place], [place] [address]    Culture   Final    NO GROWTH Performed at [facility], [address] [place], [place] [address]    Report Status 12/09/2023 FINAL  Final    Labs: CBC: Recent Labs  Lab 12/07/23 1406 12/08/23 0357 12/09/23 0840 12/10/23 0715  WBC 10.7* 12.6* 8.6 7.8  NEUTROABS 8.7*  --  6.6  --   HGB 13.7 11.8* 11.8* 10.8*  HCT 41.2 35.6* 35.3* 32.2*  MCV 97.6 97.3 97.0 97.0  PLT 260 230 254 274   Basic Metabolic Panel: Recent Labs  Lab 12/07/23 1406 12/08/23 0357 12/09/23 0840 12/10/23 0715  NA 132* 133*   --  138  K 4.2 4.9  --  4.0  CL 94* 100  --  102  CO2 25 25  --  26  GLUCOSE 97 107*  --  96  BUN 17 15  --  13  CREATININE 0.95 0.89 0.90 0.82  CALCIUM 9.1 8.5*  --  8.4*   Liver Function Tests: Recent Labs  Lab 12/07/23 1406  AST 31  ALT 27  ALKPHOS 55  BILITOT 1.8*  PROT 8.0  ALBUMIN 3.6   CBG: No results for input(s): GLUCAP in the last 168 hours.  Discharge time spent: greater than 30 minutes.  Signed: Alban Pepper, MD [facility] 12/14/2023

## 2023-12-15 ENCOUNTER — Ambulatory Visit: Admitting: Family Medicine

## 2023-12-15 ENCOUNTER — Encounter: Payer: Self-pay | Admitting: Family Medicine

## 2023-12-15 VITALS — BP 140/60 | HR 76 | Temp 97.8°F | Ht 71.5 in | Wt 209.5 lb

## 2023-12-15 DIAGNOSIS — Z8619 Personal history of other infectious and parasitic diseases: Secondary | ICD-10-CM

## 2023-12-15 DIAGNOSIS — Z23 Encounter for immunization: Secondary | ICD-10-CM | POA: Diagnosis not present

## 2023-12-15 DIAGNOSIS — N3289 Other specified disorders of bladder: Secondary | ICD-10-CM

## 2023-12-15 DIAGNOSIS — L03317 Cellulitis of buttock: Secondary | ICD-10-CM

## 2023-12-15 DIAGNOSIS — L0231 Cutaneous abscess of buttock: Secondary | ICD-10-CM

## 2023-12-15 DIAGNOSIS — A419 Sepsis, unspecified organism: Secondary | ICD-10-CM

## 2024-01-06 DIAGNOSIS — R399 Unspecified symptoms and signs involving the genitourinary system: Secondary | ICD-10-CM | POA: Diagnosis not present

## 2024-01-06 DIAGNOSIS — D494 Neoplasm of unspecified behavior of bladder: Secondary | ICD-10-CM | POA: Diagnosis not present

## 2024-01-07 ENCOUNTER — Other Ambulatory Visit: Payer: Self-pay | Admitting: Urology

## 2024-01-12 MED ORDER — GEMCITABINE CHEMO FOR BLADDER INSTILLATION 2000 MG: 2000.0000 mg | Freq: Once | INTRAVENOUS | Status: AC

## 2024-01-18 NOTE — Progress Notes (Signed)
 COVID Vaccine Completed: yes  Date of COVID positive in last 90 days:  PCP - Jacques Schroeder, MD Cardiologist -  Evalene Lunger, MD LOV 05/25/23 Epic   Chest x-ray - N/A EKG - 05/25/23 Epic  Stress Test - N/A ECHO - 05/24/23 Epic Cardiac Cath - n/a Pacemaker/ICD device last checked:N/A Spinal Cord Stimulator:N/A  Bowel Prep - N/A  Sleep Study - N/A CPAP -   Fasting Blood Sugar - N/A Checks Blood Sugar _____ times a day  Last dose of GLP1 agonist-  N/A GLP1 instructions:  Do not take after     Last dose of SGLT-2 inhibitors-  N/A SGLT-2 instructions:  Do not take after     Blood Thinner Instructions: N/A Last dose:   Time: Aspirin  Instructions: ASA 81, hold 7 days Last Dose:  Activity level: Can go up a flight of stairs and perform activities of daily living without stopping and without symptoms of chest pain or shortness of breath.   Anesthesia review: HTN, aortic vavle regurgitation, sepsis 12/07/23, has an open wound to right 5th finger. States he noticed a rash about a week ago that kept getting worse. A few days ago a white head appeared that he popped. Wound is now red with minimal blood and fluid coming out. Burnard, PA checked out wound. Instructed patient to see his PCP to assess.  Patient denies shortness of breath, fever, cough and chest pain at PAT appointment  Patient verbalized understanding of instructions that were given to them at the PAT appointment. Patient was also instructed that they will need to review over the PAT instructions again at home before surgery.

## 2024-01-19 ENCOUNTER — Encounter (HOSPITAL_COMMUNITY): Payer: Self-pay

## 2024-01-19 ENCOUNTER — Other Ambulatory Visit: Payer: Self-pay

## 2024-01-19 ENCOUNTER — Ambulatory Visit: Payer: Self-pay

## 2024-01-19 ENCOUNTER — Encounter: Payer: Self-pay | Admitting: Family Medicine

## 2024-01-19 ENCOUNTER — Ambulatory Visit: Admitting: Family Medicine

## 2024-01-19 ENCOUNTER — Encounter (HOSPITAL_COMMUNITY)
Admission: RE | Admit: 2024-01-19 | Discharge: 2024-01-19 | Disposition: A | Discharge status: Home / Self Care | Source: Ambulatory Visit | Attending: Urology | Admitting: Urology

## 2024-01-19 VITALS — BP 143/65 | HR 79 | Temp 98.0°F | Resp 16 | Wt 213.1 lb

## 2024-01-19 VITALS — BP 151/76 | HR 70 | Temp 97.4°F | Resp 16 | Ht 71.5 in | Wt 209.0 lb

## 2024-01-19 DIAGNOSIS — Z01812 Encounter for preprocedural laboratory examination: Secondary | ICD-10-CM | POA: Insufficient documentation

## 2024-01-19 DIAGNOSIS — Z87891 Personal history of nicotine dependence: Secondary | ICD-10-CM | POA: Insufficient documentation

## 2024-01-19 DIAGNOSIS — D494 Neoplasm of unspecified behavior of bladder: Secondary | ICD-10-CM | POA: Diagnosis not present

## 2024-01-19 DIAGNOSIS — G250 Essential tremor: Secondary | ICD-10-CM | POA: Diagnosis not present

## 2024-01-19 DIAGNOSIS — L03011 Cellulitis of right finger: Secondary | ICD-10-CM | POA: Diagnosis not present

## 2024-01-19 DIAGNOSIS — I77810 Thoracic aortic ectasia: Secondary | ICD-10-CM | POA: Diagnosis not present

## 2024-01-19 DIAGNOSIS — I352 Nonrheumatic aortic (valve) stenosis with insufficiency: Secondary | ICD-10-CM | POA: Insufficient documentation

## 2024-01-19 DIAGNOSIS — I10 Essential (primary) hypertension: Secondary | ICD-10-CM | POA: Diagnosis not present

## 2024-01-19 LAB — CBC
HCT: 42.7 % (ref 39.0–52.0)
Hemoglobin: 13.5 g/dL (ref 13.0–17.0)
MCH: 31.9 pg (ref 26.0–34.0)
MCHC: 31.6 g/dL (ref 30.0–36.0)
MCV: 100.9 fL — ABNORMAL HIGH (ref 80.0–100.0)
Platelets: 275 K/uL (ref 150–400)
RBC: 4.23 MIL/uL (ref 4.22–5.81)
RDW: 12.4 % (ref 11.5–15.5)
WBC: 7.8 K/uL (ref 4.0–10.5)
nRBC: 0 % (ref 0.0–0.2)

## 2024-01-19 LAB — BASIC METABOLIC PANEL WITH GFR
Anion gap: 9 (ref 5–15)
BUN: 12 mg/dL (ref 8–23)
CO2: 27 mmol/L (ref 22–32)
Calcium: 10.1 mg/dL (ref 8.9–10.3)
Chloride: 104 mmol/L (ref 98–111)
Creatinine, Ser: 0.9 mg/dL (ref 0.61–1.24)
GFR, Estimated: >60 mL/min (ref >60)
Glucose, Bld: 95 mg/dL (ref 70–99)
Potassium: 4.6 mmol/L (ref 3.5–5.1)
Sodium: 141 mmol/L (ref 135–145)

## 2024-01-19 MED ORDER — AMOXICILLIN-POT CLAVULANATE 875-125 MG PO TABS: 1.0000 | ORAL_TABLET | Freq: Two times a day (BID) | ORAL | 0 refills | Status: DC

## 2024-01-19 NOTE — Progress Notes (Signed)
      Established patient visit   Patient: Corey Williams   DOB: October 06, 1945   78 y.o. Male  MRN: [id number] Visit Date: 01/19/2024  Today's healthcare provider: Nancyann Perry, MD   Chief Complaint  Patient presents with   Hand Pain    Right pinky pain. Has some redness, swelling. Frequency:x 4 days   Subjective    Discussed the use of AI scribe software for clinical note transcription with the patient, who gave verbal consent to proceed.  History of Present Illness   Corey Williams is a 78 year old male who presents with a painful finger lesion.  He noticed a lesion on his finger approximately five days ago. Initially asymptomatic, it began to hurt two to three days ago, especially when pressure was applied. He attempted to squeeze the lesion, resulting in some bleeding. No recent scratches or bites to the area.  Three weeks ago, he was hospitalized for cellulitis/abcess of buttocks. He was treated with Cipro  during that time.        Medications: Outpatient Medications Prior to Visit  Medication Sig   acetaminophen  (TYLENOL ) 325 MG tablet Take 2 tablets (650 mg total) by mouth every 6 (six) hours as needed for mild pain (pain score 1-3) or fever (or Fever >/= 101).   aspirin  81 MG tablet Take 81 mg by mouth daily.   cholecalciferol (VITAMIN D3) 25 MCG (1000 UNIT) tablet Take 1,000 Units by mouth daily.   ibuprofen (ADVIL,MOTRIN) 200 MG tablet Take 200 mg by mouth every 6 (six) hours as needed.   losartan  (COZAAR ) 25 MG tablet Take 25 mg by mouth daily.   Multiple Vitamin (MULTIVITAMIN) tablet Take 1 tablet by mouth daily.   pravastatin  (PRAVACHOL ) 40 MG tablet Take 1 tablet by mouth once daily   Facility-Administered Medications Prior to Visit  Medication Dose Route Frequency Provider   gemcitabine (GEMZAR) chemo syringe for bladder instillation 2,000 mg  2,000 mg Bladder Instillation Once Carolee Sherwood JONETTA DOUGLAS, MD   Review of Systems  Respiratory: Negative.  Negative for  cough, shortness of breath and wheezing.   Cardiovascular:  Negative for chest pain, palpitations and leg swelling.  Neurological:  Negative for weakness and headaches.       Objective    BP (!) 143/65 (BP Location: Left Arm, Patient Position: Sitting, Cuff Size: Normal)   Pulse 79   Temp 98 F (36.7 C) (Oral)   Resp 16   Wt 213 lb 1.6 oz (96.7 kg)   SpO2 97%   BMI 29.31 kg/m   Physical Exam    Very tender with blanching erythema.     Assessment & Plan    1. Cellulitis of finger of right hand (Primary) Prepped with isopropyl alcohol and ethylene chloride.  Made small incision with #11 scalpel with mild bleeding but no purulent material expressed.  Start amoxicillin /clavulanate (AUGMENTIN) 875-125 MG tablet; Take 1 tablet by mouth 2 (two) times daily.  Dispense: 20 tablet; Refill: 0  Call if symptoms change or if not rapidly improving.         Nancyann Perry, MD  [facility] [phone] (phone) [phone] (fax)  [facility]

## 2024-01-19 NOTE — Telephone Encounter (Signed)
 FYI Only or Action Required?: FYI only for provider: appointment scheduled on 01/19/24 at [facility] with Dr Gasper.  Patient was last seen in primary care on 12/15/2023 by Watt Mirza, MD.  Called Nurse Triage reporting hand pain and abscess.  Symptoms began several days ago.  Interventions attempted: Other: cleaned with alcohol.  Symptoms are: red, swollen possible abscess on right pinky finger with white head center gradually worsening.  Triage Disposition: See Physician Within 24 Hours  Patient/caregiver understands and will follow disposition?:  Yes          Copied from CRM #[id number]. Topic: Clinical - Red Word Triage >> Jan 19, 2024 10:18 AM Deaijah H wrote: Red Word that prompted transfer to Nurse Triage: Sore on finger , swollen and looks infected. Reason for Disposition  Looks infected (spreading redness, pus)  Answer Assessment - Initial Assessment Questions 1. ONSET: When did the pain start?      4 days ago.  2. LOCATION and RADIATION: Where is the pain located?  (e.g., fingertip, around nail, joint, entire      Right pinky finger just below the nail at knuckle.  3. SEVERITY: How bad is the pain? What does it keep you from doing?   (Scale 1-10; or mild, moderate, severe)     5/10.  4. APPEARANCE: What does the finger look like? (e.g., redness, swelling, bruising, pallor)     Swelling (mild; states he is able to bend and straighten the finger) with a pus/white center and redness.  5. WORK OR EXERCISE: Has there been any recent work or exercise that involved this part (i.e., fingers or hand) of the body?     No insect bites or injuries.  6. CAUSE: What do you think is causing the pain?     Infection. He states he thought it was a rash at first and he used alcohol on it.  7. AGGRAVATING FACTORS: What makes the pain worse? (e.g., using computer)     Pushing down on the wound/abscess.  8. OTHER SYMPTOMS: Do you have any other symptoms? (e.g.,  fever, neck pain, numbness)     Denies fever.  Protocols used: Finger Pain-A-AH

## 2024-01-19 NOTE — Patient Instructions (Signed)
 [name]  Please review the attached list of medications and notify my office if there are any errors.   . Please bring all of your medications to every appointment so we can make sure that our medication list is the same as yours.

## 2024-01-19 NOTE — Patient Instructions (Addendum)
 SURGICAL WAITING ROOM VISITATION  Patients having surgery or a procedure may have no more than 2 support people in the waiting area - these visitors may rotate.    Children under the age of 80 must have an adult with them who is not the patient.  Visitors with respiratory illnesses are discouraged from visiting and should remain at home.  If the patient needs to stay at the hospital during part of their recovery, the visitor guidelines for inpatient rooms apply. Pre-op nurse will coordinate an appropriate time for 1 support person to accompany patient in pre-op.  This support person may not rotate.    Please refer to the [facility] website for the visitor guidelines for Inpatients (after your surgery is over and you are in a regular room).    Your procedure is scheduled on: 01/24/24   Report to [facility] Main Entrance    Report to admitting at 8:45 AM   Call this number if you have problems the morning of surgery [phone]   Do not eat food or drink liquids :After Midnight.          If you have questions, please contact your surgeon's office.   FOLLOW BOWEL PREP AND ANY ADDITIONAL PRE OP INSTRUCTIONS YOU RECEIVED FROM YOUR SURGEON'S OFFICE!!!     Oral Hygiene is also important to reduce your risk of infection.                                    Remember - BRUSH YOUR TEETH THE MORNING OF SURGERY WITH YOUR REGULAR TOOTHPASTE  DENTURES WILL BE REMOVED PRIOR TO SURGERY PLEASE DO NOT APPLY Poly grip OR ADHESIVES!!!   Stop all vitamins and herbal supplements 7 days before surgery.   Take these medicines the morning of surgery with A SIP OF WATER: None             You may not have any metal on your body including jewelry, and body piercing             Do not wear lotions, powders, cologne, or deodorant              Men may shave face and neck.   Do not bring valuables to the hospital. [facility] IS NOT             RESPONSIBLE   FOR VALUABLES.   Contacts,  glasses, dentures or bridgework may not be worn into surgery.  DO NOT BRING YOUR HOME MEDICATIONS TO THE HOSPITAL. PHARMACY WILL DISPENSE MEDICATIONS LISTED ON YOUR MEDICATION LIST TO YOU DURING YOUR ADMISSION IN THE HOSPITAL!    Patients discharged on the day of surgery will not be allowed to drive home.  Someone NEEDS to stay with you for the first 24 hours after anesthesia.              Please read over the following fact sheets you were given: IF YOU HAVE QUESTIONS ABOUT YOUR PRE-OP INSTRUCTIONS PLEASE CALL [phone] Millman.   If you received a COVID test during your pre-op visit  it is requested that you wear a mask when out in public, stay away from anyone that may not be feeling well and notify your surgeon if you develop symptoms. If you test positive for Covid or have been in contact with anyone that has tested positive in the last 10 days please notify you surgeon.  [place] - Preparing for Surgery Before surgery, you can play an important role.  Because skin is not sterile, your skin needs to be as free of germs as possible.  You can reduce the number of germs on your skin by washing with CHG (chlorahexidine gluconate) soap before surgery.  CHG is an antiseptic cleaner which kills germs and bonds with the skin to continue killing germs even after washing. Please DO NOT use if you have an allergy to CHG or antibacterial soaps.  If your skin becomes reddened/irritated stop using the CHG and inform your nurse when you arrive at Short Stay. Do not shave (including legs and underarms) for at least 48 hours prior to the first CHG shower.  You may shave your face/neck.  Please follow these instructions carefully:  1.  Shower with CHG Soap the night before surgery and the morning of surgery.  2.  If you choose to wash your hair, wash your hair first as usual with your normal  shampoo.  3.  After you shampoo, rinse your hair and body thoroughly to remove the shampoo.                              4.  Use CHG as you would any other liquid soap.  You can apply chg directly to the skin and wash.  Gently with a scrungie or clean washcloth.  5.  Apply the CHG Soap to your body ONLY FROM THE NECK DOWN.   Do   not use on face/ open                           Wound or open sores. Avoid contact with eyes, ears mouth and   genitals (private parts).                       Wash face,  Genitals (private parts) with your normal soap.             6.  Wash thoroughly, paying special attention to the area where your    surgery  will be performed.  7.  Thoroughly rinse your body with warm water from the neck down.  8.  DO NOT shower/wash with your normal soap after using and rinsing off the CHG Soap.                9.  Pat yourself dry with a clean towel.            10.  Wear clean pajamas.            11.  Place clean sheets on your bed the night of your first shower and do not  sleep with pets. Day of Surgery : Do not apply any CHG, lotions/deodorants the morning of surgery.  Please wear clean clothes to the hospital/surgery center.  FAILURE TO FOLLOW THESE INSTRUCTIONS MAY RESULT IN THE CANCELLATION OF YOUR SURGERY  PATIENT SIGNATURE_________________________________  NURSE SIGNATURE__________________________________  ________________________________________________________________________

## 2024-01-20 ENCOUNTER — Encounter (HOSPITAL_COMMUNITY): Payer: Self-pay

## 2024-01-20 NOTE — Anesthesia Preprocedure Evaluation (Addendum)
 Anesthesia Evaluation  Patient identified by MRN, date of birth, ID band Patient awake    Reviewed: Allergy & Precautions, NPO status , Patient's Chart, lab work & pertinent test results  Airway Mallampati: II  TM Distance: >3 FB Neck ROM: Full    Dental  (+) Teeth Intact, Dental Advisory Given   Pulmonary former smoker   breath sounds clear to auscultation       Cardiovascular hypertension,  Rhythm:Regular Rate:Normal + Diastolic murmurs TTE (05/2023):  1. Left ventricular ejection fraction, by estimation, is 50 to 55%. Left  ventricular ejection fraction by 2D MOD biplane is 52.7 %. The left  ventricle has low normal function. The left ventricle has no regional wall  motion abnormalities. Left ventricular diastolic parameters were normal. The average left ventricular global longitudinal strain is -17.7 %. The global longitudinal strain is  normal.   2. Right ventricular systolic function is normal. The right ventricular  size is normal.   3. Left atrial size was mildly dilated.   4. The mitral valve is normal in structure. Mild mitral valve  regurgitation.   5. The aortic valve is tricuspid. Aortic valve regurgitation is moderate  to severe. Aortic valve sclerosis/calcification is present, without any  evidence of aortic stenosis.   6. Aortic dilatation noted. There is mild dilatation of the aortic root,  measuring 42 mm. There is mild dilatation of the ascending aorta,  measuring 40 mm.   7. The inferior vena cava is normal in size with greater than 50%  respiratory variability, suggesting right atrial pressure of 3 mmHg.     Neuro/Psych    GI/Hepatic   Endo/Other    Renal/GU      Musculoskeletal   Abdominal   Peds  Hematology   Anesthesia Other Findings   Reproductive/Obstetrics                              Anesthesia Physical Anesthesia Plan  ASA: 4  Anesthesia Plan: General    Post-op Pain Management:    Induction: Intravenous  PONV Risk Score and Plan: 2 and Ondansetron , Dexamethasone and Treatment may vary due to age or medical condition  Airway Management Planned: Oral ETT  Additional Equipment:   Intra-op Plan:   Post-operative Plan: Extubation in OR  Informed Consent:      Dental advisory given  Plan Discussed with: CRNA and Surgeon  Anesthesia Plan Comments: (See PAT note from 10/29)         Anesthesia Quick Evaluation

## 2024-01-20 NOTE — Progress Notes (Signed)
 Case: [id number] Date/Time: 01/24/24 1045   Procedures:      TURBT, WITH CHEMOTHERAPEUTIC AGENT INSTILLATION INTO BLADDER     INSTILLATION, BLADDER - INSTILL INTRAVESICAL GEMCITABINE     CYSTOSCOPY, WITH RETROGRADE PYELOGRAM (Bilateral)   Anesthesia type: General   Diagnosis: Bladder neoplasm [D49.4]   Pre-op diagnosis: BLADDER CANCER   Location: [facility] PROCEDURE [facility] / [facility]   Surgeons: Carolee Sherwood JONETTA DOUGLAS, MD       DISCUSSION: Corey Williams is a 78 yo male with PMH of former smoking, HTN, TAA/aortic root dilation, severe aortic insufficiency, essential tremor, bladder cancer.  Patient follows with Cardiology for hx of moderate to severe aortic insufficiency. Last seen by Dr. Gollan on 05/25/23. Last Echo in 05/2023 showed LVEF 50-55%, mild MR, mod-severe AI, mild dilation of aortic root (42mm). Per Dr. Gollan: He certainly meets indications for aortic valve surgery given severity of regurgitation, likely severe on review of images, dilation of left ventricle, mild decrease in left ventricular function. He reports that given recent stressors at home, timing is poor. He will call us  for any worsening shortness of breath. We have recommended we work on identifying the appropriate cardiothoracic surgeon for his valve surgery, including minimally invasive approaches  Discussed with E Fitzgerald. Ok to proceed since pt is asymptomatic and EF is normal.  Pt seen in PAT for concerns of right pinky finger infection. He was just hospitalized for sepsis and buttock abscess which is healing and now he developed a lesion on right pinky finger which he squeezed and is now red, swollen, painful, draining. Advised to f/u with PCP for antibiotics. He was seen 10/29 by PCP and started on Augmentin.  VS: BP (!) 151/76   Pulse 70   Temp (!) 36.3 C (Oral)   Resp 16   Ht 5' 11.5 (1.816 m)   Wt 94.8 kg   SpO2 98%   BMI 28.74 kg/m   PROVIDERS: Watt Mirza, MD   LABS: Labs reviewed: Acceptable for  surgery. (all labs ordered are listed, but only abnormal results are displayed)  Labs Reviewed  CBC - Abnormal; Notable for the following components:      Result Value   MCV 100.9 (*)    All other components within normal limits  BASIC METABOLIC PANEL WITH GFR    EKG 05/25/23:  Sinus bradycardia with 1st degree A-V block Left ventricular hypertrophy with repolarization abnormality ( Sokolow-Lyon ) No previous ECGs available  Echo 05/24/2023:  IMPRESSIONS    1. Left ventricular ejection fraction, by estimation, is 50 to 55%. Left ventricular ejection fraction by 2D MOD biplane is 52.7 %. The left ventricle has low normal function. The left ventricle has no regional wall motion abnormalities. Left ventricular diastolic parameters were normal. The average left ventricular global longitudinal strain is -17.7 %. The global longitudinal strain is normal.  2. Right ventricular systolic function is normal. The right ventricular size is normal.  3. Left atrial size was mildly dilated.  4. The mitral valve is normal in structure. Mild mitral valve regurgitation.  5. The aortic valve is tricuspid. Aortic valve regurgitation is moderate to severe. Aortic valve sclerosis/calcification is present, without any evidence of aortic stenosis.  6. Aortic dilatation noted. There is mild dilatation of the aortic root, measuring 42 mm. There is mild dilatation of the ascending aorta, measuring 40 mm.  7. The inferior vena cava is normal in size with greater than 50% respiratory variability, suggesting right atrial pressure of 3 mmHg.  Past  Medical History:  Diagnosis Date   Aortic insufficiency    Ascending aorta dilatation    Basal cell carcinoma 04/21/2021   right lateral neck below the ear, EDC   Dilated aortic root    Heart murmur    Hereditary essential tremor 12/28/2013   Hyperlipidemia    Hypertension     Past Surgical History:  Procedure Laterality Date   COLONOSCOPY     no  prior surgery     TUBAL LIGATION  1986    MEDICATIONS:  acetaminophen  (TYLENOL ) 325 MG tablet   amoxicillin -clavulanate (AUGMENTIN) 875-125 MG tablet   aspirin  81 MG tablet   cholecalciferol (VITAMIN D3) 25 MCG (1000 UNIT) tablet   ibuprofen (ADVIL,MOTRIN) 200 MG tablet   losartan  (COZAAR ) 25 MG tablet   Multiple Vitamin (MULTIVITAMIN) tablet   pravastatin  (PRAVACHOL ) 40 MG tablet   No current facility-administered medications for this encounter.    gemcitabine (GEMZAR) chemo syringe for bladder instillation 2,000 mg   Armoni Kludt M Merion Grimaldo, PA-C [facility] Short Stay/Anesthesiology [facility] Phone [phone] 01/20/2024 10:29 AM

## 2024-01-24 ENCOUNTER — Other Ambulatory Visit: Payer: Self-pay

## 2024-01-24 ENCOUNTER — Encounter (HOSPITAL_COMMUNITY): Payer: Self-pay | Admitting: Urology

## 2024-01-24 ENCOUNTER — Ambulatory Visit (HOSPITAL_BASED_OUTPATIENT_CLINIC_OR_DEPARTMENT_OTHER): Payer: Self-pay

## 2024-01-24 ENCOUNTER — Ambulatory Visit (HOSPITAL_COMMUNITY)
Admission: RE | Admit: 2024-01-24 | Discharge: 2024-01-24 | Disposition: A | Discharge status: Home / Self Care | Source: Ambulatory Visit | Attending: Urology | Admitting: Urology

## 2024-01-24 ENCOUNTER — Encounter (HOSPITAL_COMMUNITY)
Admission: RE | Disposition: A | Discharge status: Home / Self Care | Payer: Self-pay | Source: Ambulatory Visit | Attending: Urology

## 2024-01-24 ENCOUNTER — Ambulatory Visit (HOSPITAL_COMMUNITY): Payer: Self-pay | Admitting: Medical

## 2024-01-24 ENCOUNTER — Ambulatory Visit (HOSPITAL_COMMUNITY)

## 2024-01-24 DIAGNOSIS — Z87891 Personal history of nicotine dependence: Secondary | ICD-10-CM | POA: Diagnosis not present

## 2024-01-24 DIAGNOSIS — N35911 Unspecified urethral stricture, male, meatal: Secondary | ICD-10-CM | POA: Insufficient documentation

## 2024-01-24 DIAGNOSIS — D494 Neoplasm of unspecified behavior of bladder: Secondary | ICD-10-CM | POA: Diagnosis not present

## 2024-01-24 DIAGNOSIS — Q540 Hypospadias, balanic: Secondary | ICD-10-CM | POA: Insufficient documentation

## 2024-01-24 DIAGNOSIS — I10 Essential (primary) hypertension: Secondary | ICD-10-CM | POA: Diagnosis not present

## 2024-01-24 DIAGNOSIS — C672 Malignant neoplasm of lateral wall of bladder: Secondary | ICD-10-CM | POA: Insufficient documentation

## 2024-01-24 DIAGNOSIS — D090 Carcinoma in situ of bladder: Secondary | ICD-10-CM | POA: Diagnosis not present

## 2024-01-24 HISTORY — PX: BLADDER INSTILLATION: SHX6893

## 2024-01-24 HISTORY — PX: CYSTOSCOPY W/ RETROGRADES: SHX1426

## 2024-01-24 SURGERY — TURBT, WITH CHEMOTHERAPEUTIC AGENT INSTILLATION INTO BLADDER
Anesthesia: General

## 2024-01-24 MED ORDER — ACETAMINOPHEN 10 MG/ML IV SOLN
INTRAVENOUS | Status: AC
  Filled 2024-01-24: qty 100

## 2024-01-24 MED ORDER — PHENYLEPHRINE 80 MCG/ML (10ML) SYRINGE FOR IV PUSH (FOR BLOOD PRESSURE SUPPORT)
PREFILLED_SYRINGE | INTRAVENOUS | Status: AC
  Filled 2024-01-24: qty 10

## 2024-01-24 MED ORDER — LACTATED RINGERS IV SOLN: INTRAVENOUS | Status: DC

## 2024-01-24 MED ORDER — GLYCOPYRROLATE 0.2 MG/ML IJ SOLN
INTRAMUSCULAR | Status: AC
  Filled 2024-01-24: qty 1

## 2024-01-24 MED ORDER — DEXAMETHASONE SOD PHOSPHATE PF 10 MG/ML IJ SOLN
INTRAMUSCULAR | Status: DC | PRN
  Administered 2024-01-24: 5 mg via INTRAVENOUS

## 2024-01-24 MED ORDER — CEFAZOLIN SODIUM-DEXTROSE 2-4 GM/100ML-% IV SOLN
2.0000 g | INTRAVENOUS | Status: AC
  Administered 2024-01-24: 2 g via INTRAVENOUS
  Filled 2024-01-24: qty 100

## 2024-01-24 MED ORDER — LIDOCAINE HCL (PF) 2 % IJ SOLN
INTRAMUSCULAR | Status: AC
  Filled 2024-01-24: qty 5

## 2024-01-24 MED ORDER — SODIUM CHLORIDE 0.9 % IR SOLN
Status: DC | PRN
  Administered 2024-01-24: 9000 mL via INTRAVESICAL

## 2024-01-24 MED ORDER — IOHEXOL 300 MG/ML  SOLN
INTRAMUSCULAR | Status: DC | PRN
  Administered 2024-01-24: 10 mL

## 2024-01-24 MED ORDER — SUGAMMADEX SODIUM 200 MG/2ML IV SOLN
INTRAVENOUS | Status: AC
Start: 2024-01-24 — End: 2024-01-24
  Filled 2024-01-24: qty 2

## 2024-01-24 MED ORDER — ROCURONIUM BROMIDE 10 MG/ML (PF) SYRINGE
PREFILLED_SYRINGE | INTRAVENOUS | Status: AC
Start: 2024-01-24 — End: 2024-01-24
  Filled 2024-01-24: qty 10

## 2024-01-24 MED ORDER — GEMCITABINE CHEMO FOR BLADDER INSTILLATION 2000 MG
2000.0000 mg | Freq: Once | INTRAVENOUS | Status: AC
  Administered 2024-01-24: 2000 mg via INTRAVESICAL
  Filled 2024-01-24: qty 2000

## 2024-01-24 MED ORDER — PHENYLEPHRINE HCL-NACL 20-0.9 MG/250ML-% IV SOLN
INTRAVENOUS | Status: DC | PRN
  Administered 2024-01-24: 35 ug/min via INTRAVENOUS

## 2024-01-24 MED ORDER — PHENYLEPHRINE HCL (PRESSORS) 10 MG/ML IV SOLN
INTRAVENOUS | Status: DC | PRN
  Administered 2024-01-24: 80 ug via INTRAVENOUS
  Administered 2024-01-24 (×2): 40 ug via INTRAVENOUS
  Administered 2024-01-24: 80 ug via INTRAVENOUS

## 2024-01-24 MED ORDER — SUGAMMADEX SODIUM 200 MG/2ML IV SOLN
INTRAVENOUS | Status: AC
  Filled 2024-01-24: qty 2

## 2024-01-24 MED ORDER — PROPOFOL 10 MG/ML IV BOLUS
INTRAVENOUS | Status: DC | PRN
  Administered 2024-01-24: 50 mg via INTRAVENOUS
  Administered 2024-01-24 (×2): 20 mg via INTRAVENOUS
  Administered 2024-01-24: 100 ug/kg/min via INTRAVENOUS
  Administered 2024-01-24 (×2): 20 mg via INTRAVENOUS

## 2024-01-24 MED ORDER — LIDOCAINE HCL (CARDIAC) PF 100 MG/5ML IV SOSY
PREFILLED_SYRINGE | INTRAVENOUS | Status: DC | PRN
  Administered 2024-01-24: 100 mg via INTRAVENOUS

## 2024-01-24 MED ORDER — ACETAMINOPHEN 10 MG/ML IV SOLN
INTRAVENOUS | Status: DC | PRN
  Administered 2024-01-24: 1000 mg via INTRAVENOUS

## 2024-01-24 MED ORDER — ROCURONIUM BROMIDE 100 MG/10ML IV SOLN
INTRAVENOUS | Status: DC | PRN
  Administered 2024-01-24: 60 mg via INTRAVENOUS

## 2024-01-24 MED ORDER — FENTANYL CITRATE (PF) 100 MCG/2ML IJ SOLN
INTRAMUSCULAR | Status: AC
  Filled 2024-01-24: qty 2

## 2024-01-24 MED ORDER — GLYCOPYRROLATE 0.2 MG/ML IJ SOLN
INTRAMUSCULAR | Status: DC | PRN
  Administered 2024-01-24 (×2): .1 mg via INTRAVENOUS

## 2024-01-24 MED ORDER — SUGAMMADEX SODIUM 200 MG/2ML IV SOLN
INTRAVENOUS | Status: DC | PRN
  Administered 2024-01-24: 400 mg via INTRAVENOUS

## 2024-01-24 MED ORDER — CHLORHEXIDINE GLUCONATE 0.12 % MT SOLN
15.0000 mL | Freq: Once | OROMUCOSAL | Status: AC
  Administered 2024-01-24: 15 mL via OROMUCOSAL

## 2024-01-24 MED ORDER — ORAL CARE MOUTH RINSE: 15.0000 mL | Freq: Once | OROMUCOSAL | Status: AC

## 2024-01-24 MED ORDER — PROPOFOL 10 MG/ML IV BOLUS
INTRAVENOUS | Status: AC
Start: 2024-01-24 — End: 2024-01-24
  Filled 2024-01-24: qty 20

## 2024-01-24 MED ORDER — FENTANYL CITRATE (PF) 100 MCG/2ML IJ SOLN
INTRAMUSCULAR | Status: DC | PRN
  Administered 2024-01-24: 50 ug via INTRAVENOUS

## 2024-01-24 MED ORDER — ONDANSETRON HCL 4 MG/2ML IJ SOLN
INTRAMUSCULAR | Status: AC
  Filled 2024-01-24: qty 2

## 2024-01-24 MED ORDER — ONDANSETRON HCL 4 MG/2ML IJ SOLN
INTRAMUSCULAR | Status: DC | PRN
  Administered 2024-01-24: 4 mg via INTRAVENOUS

## 2024-01-24 MED ORDER — HYDROCODONE-ACETAMINOPHEN 5-325 MG PO TABS: 1.0000 | ORAL_TABLET | Freq: Four times a day (QID) | ORAL | 0 refills | Status: DC | PRN

## 2024-01-24 MED ORDER — PROPOFOL 1000 MG/100ML IV EMUL
INTRAVENOUS | Status: AC
  Filled 2024-01-24: qty 100

## 2024-01-24 SURGICAL SUPPLY — 20 items
BAG URINE DRAIN 2000ML AR STRL (UROLOGICAL SUPPLIES) IMPLANT
BAG URO CATCHER STRL LF (MISCELLANEOUS) ×2 IMPLANT
CATH FOLEY 2WAY SLVR 5CC 18FR (CATHETERS) IMPLANT
CATH URETL OPEN END 6FR 70 (CATHETERS) ×2 IMPLANT
CLOTH BEACON ORANGE TIMEOUT ST (SAFETY) ×2 IMPLANT
DRAPE FOOT SWITCH (DRAPES) ×2 IMPLANT
ELECT REM PT RETURN 15FT ADLT (MISCELLANEOUS) IMPLANT
GLOVE BIO SURGEON STRL SZ7.5 (GLOVE) ×2 IMPLANT
GOWN STRL REUS W/ TWL XL LVL3 (GOWN DISPOSABLE) ×2 IMPLANT
GUIDEWIRE STR DUAL SENSOR (WIRE) IMPLANT
KIT TURNOVER KIT A (KITS) ×2 IMPLANT
LOOP CUT BIPOLAR 24F LRG (ELECTROSURGICAL) IMPLANT
MANIFOLD NEPTUNE II (INSTRUMENTS) ×2 IMPLANT
NS IRRIG 1000ML POUR BTL (IV SOLUTION) IMPLANT
PACK CYSTO (CUSTOM PROCEDURE TRAY) ×2 IMPLANT
PAD TELFA 2X3 NADH STRL (GAUZE/BANDAGES/DRESSINGS) IMPLANT
PLUG CATH AND CAP STRL 200 (CATHETERS) IMPLANT
SYRINGE TOOMEY IRRIG 70ML (MISCELLANEOUS) IMPLANT
TUBING CONNECTING 10 (TUBING) ×2 IMPLANT
TUBING UROLOGY SET (TUBING) IMPLANT

## 2024-01-24 NOTE — Anesthesia Procedure Notes (Signed)
 Procedure Name: Intubation Date/Time: 01/24/2024 9:43 AM  Performed by: Belvie Valri NOVAK, CRNAPre-anesthesia Checklist: Patient identified, Emergency Drugs available, Suction available and Patient being monitored Patient Re-evaluated:Patient Re-evaluated prior to induction Oxygen Delivery Method: Circle System Utilized Preoxygenation: Pre-oxygenation with 100% oxygen Induction Type: IV induction Ventilation: Mask ventilation without difficulty and Oral airway inserted - appropriate to patient size Laryngoscope Size: Mac and 4 Grade View: Grade II Tube type: Oral Tube size: 7.5 mm Number of attempts: 1 Airway Equipment and Method: Stylet and Oral airway Placement Confirmation: ETT inserted through vocal cords under direct vision, positive ETCO2 and breath sounds checked- equal and bilateral Secured at: 25 cm Tube secured with: Tape Dental Injury: Teeth and Oropharynx as per pre-operative assessment

## 2024-01-24 NOTE — Anesthesia Postprocedure Evaluation (Signed)
 Anesthesia Post Note  Patient: Corey Williams  Procedure(s) Performed: TURBT, WITH CHEMOTHERAPEUTIC AGENT INSTILLATION INTO BLADDER INSTILLATION, BLADDER CYSTOSCOPY, WITH RETROGRADE PYELOGRAM (Bilateral)     Patient location during evaluation: PACU Anesthesia Type: General Level of consciousness: awake Pain management: pain level controlled Vital Signs Assessment: post-procedure vital signs reviewed and stable Respiratory status: spontaneous breathing Cardiovascular status: blood pressure returned to baseline Postop Assessment: no apparent nausea or vomiting Anesthetic complications: no   No notable events documented.  Last Vitals:  Vitals:   01/24/24 1226 01/24/24 1230  BP: (!) 152/61 (!) 135/59  Pulse: (!) 59 (!) 57  Resp: 16 17  Temp: 36.4 C   SpO2: 95% 96%    Last Pain:  Vitals:   01/24/24 1230  TempSrc:   PainSc: 0-No pain                 Lauraine KATHEE [place]

## 2024-01-24 NOTE — Transfer of Care (Signed)
 Immediate Anesthesia Transfer of Care Note  Patient: Corey Williams  Procedure(s) Performed: TURBT, WITH CHEMOTHERAPEUTIC AGENT INSTILLATION INTO BLADDER INSTILLATION, BLADDER CYSTOSCOPY, WITH RETROGRADE PYELOGRAM (Bilateral)  Patient Location: PACU  Anesthesia Type:General  Level of Consciousness: drowsy  Airway & Oxygen Therapy: Patient Spontanous Breathing and Patient connected to nasal cannula oxygen  Post-op Assessment: Report given to RN and Post -op Vital signs reviewed and stable  Post vital signs: Reviewed and stable  Last Vitals:  Vitals Value Taken Time  BP 125/74 01/24/24 10:42  Temp    Pulse 65 01/24/24 10:44  Resp 13 01/24/24 10:44  SpO2 97 % 01/24/24 10:44  Vitals shown include unfiled device data.  Last Pain:  Vitals:   01/24/24 0903  TempSrc: Oral  PainSc:          Complications: No notable events documented.

## 2024-01-24 NOTE — H&P (Signed)
 CC: Possible bladder mass HPI: 01/06/2024 78 year old male patient of Dr. Twylla underwent a CT renal stone protocol on 10/17/2022 that revealed a 2 cm area of increased attenuation in the posterior bladder wall and numerous bilateral renal cysts that appeared simple. He had a follow-up CT of the abdomen and pelvis with contrast for right buttock abscess that again revealed a 2 cm enhancing lesion along the right posterior bladder wall adjacent to the right ureterovesicular junction. Dr. Twylla noted meatal stenosis and was unable to perform cystoscopy in office.  Vitals: Date Taken By B.P. Pulse Resp. O2 Sat. Temp. Ht. Wt. BMI BSA 01/06/24 15:06 TORRESGALLEGOS, JESSICA 153/63 SIT 63 98.0 F 72.0 in* 208.0 lbs 28.2 2.2 FiO2 * Patient Reported Exam: Exam Appearance: well developed and nourished, in no acute distress Neck Exam: neck is supple Respiratory Effort: normal respiratory effort without labored breathing  Skin Inspection: normal skin turgor Orientation: alert and oriented to person, place, time Mood: mood and affect well-adjusted, pleasant and cooperative, appropriate for clinical and encounter circumstances circumcised phallus with possible coronal hypospadias  Data Reviewed:  1 Ordering of each unique test (Urinalysis, Routine) and Independent interpretation of a test performed by another physician/other qualified health care professional (not separately reported) (I reviewed het ct and her prior notes)   Impression/Plan: A urinalysis was ordered prior to the patient's visit. The results are available in the patient's test result list. Will plan for transurethral resection of bladder tumor with instillation of gemcitabine and bilateral retrograde pyelogram. Risk benefits discussed including but not limited to bleeding, infection, injury to surrounding structures including bladder perforation, need for additional procedures. Disorder of urinary system,  unspecified (R39.9) Plan: Order Tests. Labs:  996961 - Urinalysis, Routine 1. 2. Bladder Tumor Visit Note - January 06, 2024 Azarias, Chiou MRN: [id number] Phone: [phone] DOB: 05-06-1945 Sex: Male PMS ID: [phone] [id number] [facility] (Primary Provider) (Bill Under) Page 2 [phone] Work [phone] Fax [facility] [address] [place] [address] [place], [place] [address] [phone])  Plan: Counseling for Bladder Mass. Please refer to the education handout for detailed counseling. After counseling, we decided on the following plan: Retrograde ureteropyelography and Transurethral resection of bladder tumor  Plan: Schedule Surgery.  SURGERY SCHEDULING ORDER Comments: Transurethral resection of bladder tumor with instillation of gemcitabine, bilateral retrograde pyelogram--1 hour Diagnosis codes: Bladder Tumor D49.4 Surgeon: Sherwood Edison. Facility: [facility]. Anesthesia: epidural. Other Testing Requested: Per anesthesia Preop Medications Required: Ancef 2 g - 2 g iv before surgery (prior to incision) Other Medications - 2 g of gemcitabine intravesical Instructions: Follow-up in about 5 days in the morning with MD Additional Comments: Stop the aspirin  7 days prior Provider: Mavis Fichera BELL Priority: normal  Staff: SHERWOOD EDISON (Primary Provider) (Bill Under) JESSICA TORRES-GALLEGOS  Electronically Signed By: SHERWOOD EDISON, 01/06/2024 04:42 PM EDT

## 2024-01-24 NOTE — Op Note (Signed)
 Operative Note  Preoperative diagnosis:  1.  Bladder tumor 2.  Coronal hypospadias with meatal stenosis  Postoperative diagnosis: 1.  Bladder tumor--large 2.  Coronal hypospadias with meatal stenosis  Procedure(s): 1.  Cystoscopy with urethral dilation 2.  Bilateral retrograde pyelogram 3.  Transurethral resection of bladder tumor--large 4.  Intravesical instillation of gemcitabine  Surgeon: Sherwood Edison, MD  Assistants: None  Anesthesia: General  Complications: None immediate  EBL: Minimal  Specimens: 1.  Bladder tumor  Drains/Catheters: 1.  18 French Foley catheter  Intraoperative findings: 1.  Patient had a coronal hypospadias with a thin meatus with meatal stenosis.  This was dilated to 30 French 2.  Remainder of urethra without any stricture.  Prostate was moderately obstructing with a small median lobe 3.  Bladder mucosa with a dominant approximately 3 cm papillary bladder tumor with contiguous flat superficial papillary tumor surrounding it with a total area measuring 5.5 to 6 cm.  Entire area was resected and fulgurated. 4.  Right retrograde pyelogram without any filling defect or hydronephrosis 5.  Left retrograde pyelogram without filling defect or hydronephrosis.  Indication: 78 year old male with a bladder tumor and coronal hypospadias with meatal stenosis presents for the previously mentioned operation.  Description of procedure:  The patient was identified and consent was obtained.  The patient was taken to the operating room and placed in the supine position.  The patient was placed under general anesthesia.  Perioperative antibiotics were administered.  The patient was placed in dorsal lithotomy.  Patient was prepped and draped in a standard sterile fashion and a timeout was performed.  The urethral meatus was dilated with sounds from 8 French up to 30 French.  I then advanced a 21 French rigid cystoscope into the urethra and into the bladder.  Complete  cystoscopy was performed with findings noted above.  I intubated the left ureteral orifice with an open-ended ureteral catheter and a retrograde pyelogram was performed with no abnormal findings.  Same was performed on the right again with no abnormal findings.  The right posterior lateral wall bladder tumor approached the right ureteral orifice so I intubated that with a sensor wire and advanced that up to the kidney followed by advancement of an open-ended ureteral catheter over the wire and up the ureter to the proximal ureter.  Wire was withdrawn.  Scope was withdrawn keeping the open-ended ureteral catheter in place to protect the ureteral orifice during resection.  I then advanced a 25 French resectoscope with the visual obturator in place into the urethra and into the bladder.  I exchanged for the bipolar working element and resected the tumor of interest.  Resection and fulguration was right up to the right ureteral orifice with the open-ended ureteral catheter in place.  Ureteral orifice was wide open at the conclusion of the case after removal of the open-ended ureteral catheter.  After resecting the tumor, I collected the bladder tumor for specimen.  I fulgurated the resection bed and surrounding area.  There is no evidence of any perforation.  There was no active bleeding noted.  All tumor had been resected.  I withdrew the scope and placed an 77 French Foley catheter.  This concluded the operation.  Patient tolerated the procedure well was stable postoperatively.    In the PACU, I instilled gemcitabine into the bladder where it remained for proxy 1 hour prior to proper disposal.  Plan: Follow-up in 1 week for pathology review

## 2024-01-24 NOTE — Discharge Instructions (Signed)

## 2024-01-25 ENCOUNTER — Encounter (HOSPITAL_COMMUNITY): Payer: Self-pay | Admitting: Urology

## 2024-01-25 LAB — SURGICAL PATHOLOGY

## 2024-01-28 DIAGNOSIS — R3914 Feeling of incomplete bladder emptying: Secondary | ICD-10-CM | POA: Diagnosis not present

## 2024-01-28 DIAGNOSIS — C674 Malignant neoplasm of posterior wall of bladder: Secondary | ICD-10-CM | POA: Diagnosis not present

## 2024-01-28 DIAGNOSIS — Z7689 Persons encountering health services in other specified circumstances: Secondary | ICD-10-CM | POA: Diagnosis not present

## 2024-01-28 DIAGNOSIS — R3916 Straining to void: Secondary | ICD-10-CM | POA: Diagnosis not present

## 2024-01-28 DIAGNOSIS — R3912 Poor urinary stream: Secondary | ICD-10-CM | POA: Diagnosis not present

## 2024-01-28 DIAGNOSIS — R351 Nocturia: Secondary | ICD-10-CM | POA: Diagnosis not present

## 2024-01-28 DIAGNOSIS — N401 Enlarged prostate with lower urinary tract symptoms: Secondary | ICD-10-CM | POA: Diagnosis not present

## 2024-02-01 ENCOUNTER — Other Ambulatory Visit: Payer: Self-pay | Admitting: Cardiovascular Disease

## 2024-02-02 DIAGNOSIS — I10 Essential (primary) hypertension: Secondary | ICD-10-CM | POA: Diagnosis not present

## 2024-02-02 DIAGNOSIS — D494 Neoplasm of unspecified behavior of bladder: Secondary | ICD-10-CM | POA: Diagnosis not present

## 2024-02-02 NOTE — Telephone Encounter (Signed)
 Please advise if ok to fill historical medication.

## 2024-02-08 ENCOUNTER — Ambulatory Visit: Payer: Medicare HMO

## 2024-02-08 VITALS — BP 122/72 | Ht 71.5 in | Wt 208.8 lb

## 2024-02-08 DIAGNOSIS — Z0000 Encounter for general adult medical examination without abnormal findings: Secondary | ICD-10-CM | POA: Diagnosis not present

## 2024-02-08 NOTE — Progress Notes (Signed)
 Please attest and cosign this visit due to patients primary care provider not being in the office at the time the visit was completed.    Chief Complaint  Patient presents with   Medicare Wellness     Subjective:   Corey Williams is a 78 y.o. male who presents for a Medicare Annual Wellness Visit.  Allergies (verified) Patient has no known allergies.   History: Past Medical History:  Diagnosis Date   Aortic insufficiency    Ascending aorta dilatation    Basal cell carcinoma 04/21/2021   right lateral neck below the ear, EDC   Dilated aortic root    Heart murmur    Hereditary essential tremor 12/28/2013   Hyperlipidemia    Hypertension    Past Surgical History:  Procedure Laterality Date   BLADDER INSTILLATION N/A 01/24/2024   Procedure: INSTILLATION, BLADDER;  Surgeon: Carolee Sherwood JONETTA DOUGLAS, MD;  Location: [facility];  Service: Urology;  Laterality: N/A;  INSTILL INTRAVESICAL GEMCITABINE   COLONOSCOPY     CYSTOSCOPY W/ RETROGRADES Bilateral 01/24/2024   Procedure: CYSTOSCOPY, WITH RETROGRADE PYELOGRAM;  Surgeon: Carolee Sherwood JONETTA DOUGLAS, MD;  Location: [facility];  Service: Urology;  Laterality: Bilateral;   no prior surgery     TUBAL LIGATION  1986   Family History  Problem Relation Age of Onset   Stroke Father    Alcohol abuse Father    Hearing loss Father    Colon cancer Neg Hx    Stomach cancer Neg Hx    Social History   Occupational History   Occupation: Investment Banker, [employer]: RETIRED  Tobacco Use   Smoking status: Former    Current packs/day: 0.00    Types: Cigarettes    Quit date: 02/01/1974    Years since quitting: 50.0   Smokeless tobacco: Never  Vaping Use   Vaping status: Never Used  Substance and Sexual Activity   Alcohol use: Yes    Alcohol/week: 5.0 standard drinks of alcohol    Types: 5 Cans of beer per week   Drug use: No   Sexual activity: Not Currently   Tobacco Counseling Counseling given: Not Answered  SDOH Screenings   Food Insecurity: No  Food Insecurity (02/08/2024)  Housing: Low Risk  (02/08/2024)  Transportation Needs: No Transportation Needs (02/08/2024)  Utilities: Not At Risk (02/08/2024)  Alcohol Screen: Low Risk  (04/05/2023)  Depression (PHQ2-9): Low Risk  (02/08/2024)  Financial Resource Strain: Low Risk  (10/06/2023)   Received from [facility]  Physical Activity: Sufficiently Active (02/08/2024)  Social Connections: Moderately Integrated (02/08/2024)  Stress: No Stress Concern Present (02/08/2024)  Tobacco Use: Medium Risk (02/08/2024)  Health Literacy: Adequate Health Literacy (02/08/2024)   See flowsheets for full screening details  Depression Screen PHQ 2 & 9 Depression Scale- Over the past 2 weeks, how often have you been bothered by any of the following problems? Little interest or pleasure in doing things: 0 Feeling down, depressed, or hopeless (PHQ Adolescent also includes...irritable): 0 PHQ-2 Total Score: 0     Goals Addressed             This Visit's Progress    Just maintaining my health and getting things done with Dr Carolee         Visit info / Clinical Intake: Medicare Wellness Visit Type:: Subsequent Annual Wellness Visit Persons participating in visit:: patient Medicare Wellness Visit Mode:: In-person (required for WTM) Information given by:: patient Interpreter Needed?: No Pre-visit prep was  completed: yes AWV questionnaire completed by patient prior to visit?: no Living arrangements:: lives with spouse/significant other Patient's Overall Health Status Rating: good Typical amount of pain: none Does pain affect daily life?: no Are you currently prescribed opioids?: no  Dietary Habits and Nutritional Risks How many meals a day?: 3 Eats fruit and vegetables daily?: yes Most meals are obtained by: preparing own meals In the last 2 weeks, have you had any of the following?: none Diabetic:: no  Functional Status Activities of Daily Living (to include  ambulation/medication): Independent Ambulation: Independent Medication Administration: Independent Home Management: Independent Manage your own finances?: yes Primary transportation is: driving Concerns about vision?: no *vision screening is required for WTM* Concerns about hearing?: no  Fall Screening Falls in the past year?: 0 Number of falls in past year: 0 Was there an injury with Fall?: 0 Fall Risk Category Calculator: 0 Patient Fall Risk Level: Low Fall Risk  Fall Risk Patient at Risk for Falls Due to: No Fall Risks Fall risk Follow up: Education provided; Falls prevention discussed  Home and Transportation Safety: All rugs have non-skid backing?: N/A, no rugs All stairs or steps have railings?: yes Grab bars in the bathtub or shower?: yes Have non-skid surface in bathtub or shower?: yes Good home lighting?: yes Regular seat belt use?: yes Hospital stays in the last year:: no  Cognitive Assessment Difficulty concentrating, remembering, or making decisions? : no Will 6CIT or Mini Cog be Completed: yes What year is it?: 0 points What month is it?: 0 points Give patient an address phrase to remember (5 components): [address] [place]  About what time is it?: 0 points Count backwards from 20 to 1: 0 points Say the months of the year in reverse: 0 points Repeat the address phrase from earlier: 0 points 6 CIT Score: 0 points  Advance Directives (For Healthcare) Does Patient Have a Medical Advance Directive?: Yes Does patient want to make changes to medical advance directive?: No - Patient declined Type of Advance Directive: Healthcare Power of [place]; Living will Copy of Healthcare Power of Attorney in Chart?: Yes - validated most recent copy scanned in chart (See row information) Copy of Living Will in Chart?: Yes - validated most recent copy scanned in chart (See row information) Would patient like information on creating a medical advance directive?: No  - Patient declined  Reviewed/Updated  Reviewed/Updated: Reviewed All (Medical, Surgical, Family, Medications, Allergies, Care Teams, Patient Goals)        Objective:    Today's Vitals   02/08/24 0828  BP: 122/72  Weight: 208 lb 12.8 oz (94.7 kg)  Height: 5' 11.5 (1.816 m)   Body mass index is 28.72 kg/m.  Current Medications (verified) Outpatient Encounter Medications as of 02/08/2024  Medication Sig   acetaminophen  (TYLENOL ) 325 MG tablet Take 2 tablets (650 mg total) by mouth every 6 (six) hours as needed for mild pain (pain score 1-3) or fever (or Fever >/= 101).   cholecalciferol (VITAMIN D3) 25 MCG (1000 UNIT) tablet Take 1,000 Units by mouth daily.   ibuprofen (ADVIL,MOTRIN) 200 MG tablet Take 200 mg by mouth every 6 (six) hours as needed.   losartan  (COZAAR ) 25 MG tablet Take 1 tablet by mouth once daily   Multiple Vitamin (MULTIVITAMIN) tablet Take 1 tablet by mouth daily.   pravastatin  (PRAVACHOL ) 40 MG tablet Take 1 tablet by mouth once daily   amoxicillin -clavulanate (AUGMENTIN) 875-125 MG tablet Take 1 tablet by mouth 2 (two) times daily. (Patient  not taking: Reported on 02/08/2024)   HYDROcodone-acetaminophen  (NORCO/VICODIN) 5-325 MG tablet Take 1 tablet by mouth every 6 (six) hours as needed. (Patient not taking: Reported on 02/08/2024)   Facility-Administered Encounter Medications as of 02/08/2024  Medication   gemcitabine (GEMZAR) chemo syringe for bladder instillation 2,000 mg   Hearing/Vision screen Vision Screening - Comments:: UTD w/visits to Dr Portia Immunizations and Health Maintenance Health Maintenance  Topic Date Due   Zoster Vaccines- Shingrix (1 of 2) Never done   COVID-19 Vaccine (5 - 2025-26 season) 11/22/2023   Medicare Annual Wellness (AWV)  02/05/2024   DTaP/Tdap/Td (3 - Td or Tdap) 05/20/2032   Pneumococcal Vaccine: 50+ Years  Completed   Influenza Vaccine  Completed   Hepatitis C Screening  Completed   Meningococcal B Vaccine   Aged Out   Colonoscopy  Discontinued        Assessment/Plan:  This is a routine wellness examination for Corey Williams.  Patient Care Team: Watt Mirza, MD as PCP - General (Family Medicine) Portia Fireman, OD (Optometry)  I have personally reviewed and noted the following in the patient's chart:   Medical and social history Use of alcohol, tobacco or illicit drugs  Current medications and supplements including opioid prescriptions. Functional ability and status Nutritional status Physical activity Advanced directives List of other physicians Hospitalizations, surgeries, and ER visits in previous 12 months Vitals Screenings to include cognitive, depression, and falls Referrals and appointments  No orders of the defined types were placed in this encounter.  In addition, I have reviewed and discussed with patient certain preventive protocols, quality metrics, and best practice recommendations. A written personalized care plan for preventive services as well as general preventive health recommendations were provided to patient.   Corey LITTIE Saris, LPN   [id number]   CPE made with PCP  After Visit Summary: (In Person-Declined) Patient declined AVS at this time. Pt will view in MyChart  Nurse Notes: Pt has no concerns or questions. AWV to be made Jan 2027 with/prior to PCP visit (template n/a yet)

## 2024-02-08 NOTE — Patient Instructions (Signed)
 [name],  Thank you for taking the time for your Medicare Wellness Visit. I appreciate your continued commitment to your health goals. Please review the care plan we discussed, and feel free to reach out if I can assist you further.  Please note that Annual Wellness Visits do not include a physical exam. Some assessments may be limited, especially if the visit was conducted virtually. If needed, we may recommend an in-person follow-up with your provider.  Ongoing Care Seeing your primary care provider every 3 to 6 months helps us  monitor your health and provide consistent, personalized care.   Referrals If a referral was made during today's visit and you haven't received any updates within two weeks, please contact the referred provider directly to check on the status.  Recommended Screenings:  Health Maintenance  Topic Date Due   Zoster (Shingles) Vaccine (1 of 2) Never done   COVID-19 Vaccine (5 - 2025-26 season) 11/22/2023   Medicare Annual Wellness Visit  02/05/2024   DTaP/Tdap/Td vaccine (3 - Td or Tdap) 05/20/2032   Pneumococcal Vaccine for age over 66  Completed   Flu Shot  Completed   Hepatitis C Screening  Completed   Meningitis B Vaccine  Aged Out   Colon Cancer Screening  Discontinued       01/24/2024    8:40 AM  Advanced Directives  Does Patient Have a Medical Advance Directive? No  Would patient like information on creating a medical advance directive? No - Patient declined    Vision: Annual vision screenings are recommended for early detection of glaucoma, cataracts, and diabetic retinopathy. These exams can also reveal signs of chronic conditions such as diabetes and high blood pressure.  Dental: Annual dental screenings help detect early signs of oral cancer, gum disease, and other conditions linked to overall health, including heart disease and diabetes.

## 2024-02-21 DIAGNOSIS — Z8551 Personal history of malignant neoplasm of bladder: Secondary | ICD-10-CM | POA: Diagnosis not present

## 2024-03-13 ENCOUNTER — Other Ambulatory Visit (HOSPITAL_COMMUNITY): Payer: Self-pay

## 2024-04-09 NOTE — Progress Notes (Unsigned)
 "    Benoit Meech T. Maddyx Vallie, MD, CAQ Sports Medicine [facility] at [facility] [address] [place], [address]  Phone: [phone]  FAX: [phone]  Corey Williams - 79 y.o. male  MRN [id number]  Date of Birth: [birth year]  Date: 04/10/2024  PCP: Watt Mirza, MD  Referral: Watt Mirza, MD  No chief complaint on file.  Patient Care Team: Watt Mirza, MD as PCP - General (Family Medicine) Portia Fireman, OD (Optometry) Subjective:   Corey Williams is a 79 y.o. pleasant patient who presents with the following:  Discussed the use of AI scribe software for clinical note transcription with the patient, who gave verbal consent to proceed.  History of Present Illness     Preventative Health Maintenance Visit:  Health Maintenance Summary Reviewed and updated, unless pt declines services.  Tobacco History Reviewed. Alcohol: No concerns, no excessive use Exercise Habits: Some activity, rec at least 30 mins 5 times a week STD concerns: no risk or activity to increase risk Drug Use: None  Shingrix COVID booster  HTN: Tolerating all medications without side effects Stable and at goal No CP, no sob. No HA.  BP Readings from Last 3 Encounters:  02/08/24 122/72  01/24/24 (!) 135/59  01/19/24 (!) 151/76    Basic Metabolic Panel:    Component Value Date/Time   NA 141 01/19/2024 0939   NA 142 10/17/2018 0841   K 4.6 01/19/2024 0939   CL 104 01/19/2024 0939   CO2 27 01/19/2024 0939   BUN 12 01/19/2024 0939   BUN 14 10/17/2018 0841   CREATININE 0.90 01/19/2024 0939   GLUCOSE 95 01/19/2024 0939   CALCIUM 10.1 01/19/2024 0939    Lipids: Doing well, stable. Tolerating meds fine with no SE. Panel reviewed with patient.  Lipids: Lab Results  Component Value Date   CHOL 169 03/31/2023   Lab Results  Component Value Date   HDL 41.70 03/31/2023   Lab Results  Component Value Date   LDLCALC 92 03/31/2023   Lab Results   Component Value Date   TRIG 173.0 (H) 03/31/2023   Lab Results  Component Value Date   CHOLHDL 4 03/31/2023    Lab Results  Component Value Date   ALT 27 12/07/2023   AST 31 12/07/2023   ALKPHOS 55 12/07/2023   BILITOT 1.8 (H) 12/07/2023     Health Maintenance  Topic Date Due   Zoster Vaccines- Shingrix (1 of 2) Never done   COVID-19 Vaccine (5 - 2025-26 season) 11/22/2023   Medicare Annual Wellness (AWV)  02/07/2025   DTaP/Tdap/Td (3 - Td or Tdap) 05/20/2032   Pneumococcal Vaccine: 50+ Years  Completed   Influenza Vaccine  Completed   Hepatitis C Screening  Completed   Meningococcal B Vaccine  Aged Out   Colonoscopy  Discontinued   Immunization History  Administered Date(s) Administered   Fluad Quad(high Dose 65+) 03/12/2022, 01/12/2023   INFLUENZA, HIGH DOSE SEASONAL PF 01/08/2021, 12/15/2023   Influenza,inj,Quad PF,6+ Mos 01/17/2013, 12/27/2013, 01/07/2015, 01/08/2016   Influenza-Unspecified 01/04/2009, 02/10/2011, 03/01/2020   PFIZER(Purple Top)SARS-COV-2 Vaccination 05/05/2019, 05/30/2019, 12/23/2019   Pfizer(Comirnaty)Fall Seasonal Vaccine 12 years and older 12/17/2022   Pneumococcal Conjugate-13 12/27/2013   Pneumococcal Polysaccharide-23 12/22/2012   Tdap 01/28/2011, 05/21/2022   Patient Active Problem List   Diagnosis Date Noted   Essential hypertension 12/07/2023    Priority: Medium    Aneurysm of ascending aorta 10/13/2013    Priority: Medium    HYPERCHOLESTEROLEMIA 06/05/2010  Priority: Medium    Hereditary essential tremor 12/28/2013    Priority: Low   Leukocytosis 12/07/2023   Aortic valve regurgitation 10/13/2013   ACTINIC KERATOSIS, HEAD 06/05/2010    Past Medical History:  Diagnosis Date   Aortic insufficiency    Ascending aorta dilatation    Basal cell carcinoma 04/21/2021   right lateral neck below the ear, EDC   Dilated aortic root    Heart murmur    Hereditary essential tremor 12/28/2013   Hyperlipidemia    Hypertension      Past Surgical History:  Procedure Laterality Date   BLADDER INSTILLATION N/A 01/24/2024   Procedure: INSTILLATION, BLADDER;  Surgeon: Carolee Sherwood JONETTA DOUGLAS, MD;  Location: [facility];  Service: Urology;  Laterality: N/A;  INSTILL INTRAVESICAL GEMCITABINE    COLONOSCOPY     CYSTOSCOPY W/ RETROGRADES Bilateral 01/24/2024   Procedure: CYSTOSCOPY, WITH RETROGRADE PYELOGRAM;  Surgeon: Carolee Sherwood JONETTA DOUGLAS, MD;  Location: [facility];  Service: Urology;  Laterality: Bilateral;   no prior surgery     TUBAL LIGATION  1986    Family History  Problem Relation Age of Onset   Stroke Father    Alcohol abuse Father    Hearing loss Father    Colon cancer Neg Hx    Stomach cancer Neg Hx     Social History   Social History Narrative   Regular exercise--yes    Past Medical History, Surgical History, Social History, Family History, Problem List, Medications, and Allergies have been reviewed and updated if relevant.  Review of Systems: Pertinent positives are listed above.  Otherwise, a full 14 point review of systems has been done in full and it is negative except where it is noted positive.  Objective:   There were no vitals taken for this visit. Ideal Body Weight:    Ideal Body Weight:   No results found.    02/08/2024    8:25 AM 01/19/2024    1:07 PM 12/14/2023    3:58 PM 02/05/2023    8:21 AM 01/20/2022    9:52 AM  Depression screen PHQ 2/9  Decreased Interest 0 0 0 0 0  Down, Depressed, Hopeless 0 0 0 0 0  PHQ - 2 Score 0 0 0 0 0     GEN: well developed, well nourished, no acute distress Eyes: conjunctiva and lids normal, PERRLA, EOMI ENT: TM clear, nares clear, oral exam WNL Neck: supple, no lymphadenopathy, no thyromegaly, no JVD Pulm: clear to auscultation and percussion, respiratory effort normal CV: regular rate and rhythm, S1-S2, no murmur, rub or gallop, no bruits, peripheral pulses normal and symmetric, no cyanosis, clubbing, edema or varicosities GI: soft, non-tender; no  hepatosplenomegaly, masses; active bowel sounds all quadrants GU: deferred Lymph: no cervical, axillary or inguinal adenopathy MSK: gait normal, muscle tone and strength WNL, no joint swelling, effusions, discoloration, crepitus  SKIN: clear, good turgor, color WNL, no rashes, lesions, or ulcerations Neuro: normal mental status, normal strength, sensation, and motion Psych: alert; oriented to person, place and time, normally interactive and not anxious or depressed in appearance.  All labs reviewed with patient. Results for orders placed or performed during the hospital encounter of 01/24/24  Surgical pathology   Collection Time: 01/24/24 10:08 AM  Result Value Ref Range   SURGICAL PATHOLOGY      SURGICAL PATHOLOGY CASE: [id number] PATIENT: Abhimanyu Shaneyfelt Surgical Pathology Report     Clinical History: Bladder cancer (las)     FINAL MICROSCOPIC DIAGNOSIS:  A. BLADDER TUMOR, TURBT:  Noninvasive high grade papillary urothelial carcinoma Muscularis propria (detrusor muscle) is present and not involved by carcinoma   GROSS DESCRIPTION:  Specimen is received fresh and consists of a 2.7 x 2.5 x 0.6 cm aggregate of tan-pink yellow soft tissue.  The specimen is entirely submitted in 3 cassettes.  SHIRLEEN 01/24/2024)    Final Diagnosis performed by Zhaoli Lane, MD.   Electronically signed 01/25/2024 Technical and / or Professional components performed at [facility], [address], [place], [place] [address].  Immunohistochemistry Technical component (if applicable) was performed at [facility]. [address], [address], [place], [place] [address].   IMMUNOHISTOCHEMISTRY DISCLAIMER (if applicable): Some of these immunoh istochemical stains may have been developed and the performance characteristics determine by [facility]. Some may not have been cleared or approved by the U.S. Food and Drug Administration. The FDA has  determined that such clearance or approval is not necessary. This test is used for clinical purposes. It should not be regarded as investigational or for research. This laboratory is certified under the Clinical Laboratory Improvement Amendments of 1988 (CLIA-88) as qualified to perform high complexity clinical laboratory testing.  The controls stained appropriately.   IHC stains are performed on formalin fixed, paraffin embedded tissue using a 3,3diaminobenzidine (DAB) chromogen and Leica Bond Autostainer System. The staining intensity of the nucleus is score manually and is reported as the percentage of tumor cell nuclei demonstrating specific nuclear staining. The specimens are fixed in 10% Neutral Formalin for at least 6 hours and up to 72hrs. These tests  are validated on decalcified tissue. Results should be interpreted with caution given the possibility of false negative results on decalcified specimens. Antibody Clones are as follows ER-clone [id number], PR-clone 16, Ki67- clone MM1. Some of these immunohistochemical stains may have been developed and the performance characteristics determined by [facility].     Assessment and Plan:     ICD-10-CM   1. Healthcare maintenance  Z00.00      Assessment & Plan   Health Maintenance Exam: The patient's preventative maintenance and recommended screening tests for an annual wellness exam were reviewed in full today. Brought up to date unless services declined.  Counselled on the importance of diet, exercise, and its role in overall health and mortality. The patient's FH and SH was reviewed, including their home life, tobacco status, and drug and alcohol status.  Follow-up in 1 year for physical exam or additional follow-up below.  Disposition: No follow-ups on file.  No orders of the defined types were placed in this encounter.  There are no discontinued medications. No orders of the defined types were placed in this  encounter.   Signed,  Jacques DASEN. Lekeshia Kram, MD   Allergies as of 04/10/2024   No Known Allergies      Medication List        Accurate as of April 09, 2024  9:53 AM. If you have any questions, ask your nurse or doctor.          acetaminophen  325 MG tablet Commonly known as: TYLENOL  Take 2 tablets (650 mg total) by mouth every 6 (six) hours as needed for mild pain (pain score 1-3) or fever (or Fever >/= 101).   amoxicillin -clavulanate 875-125 MG tablet Commonly known as: AUGMENTIN  Take 1 tablet by mouth 2 (two) times daily.   cholecalciferol 25 MCG (1000 UNIT) tablet Commonly known as: VITAMIN D3 Take 1,000 Units by mouth daily.   HYDROcodone -acetaminophen  5-325 MG tablet Commonly  known as: NORCO/VICODIN Take 1 tablet by mouth every 6 (six) hours as needed.   ibuprofen 200 MG tablet Commonly known as: ADVIL Take 200 mg by mouth every 6 (six) hours as needed.   losartan  25 MG tablet Commonly known as: COZAAR  Take 1 tablet by mouth once daily   multivitamin tablet Take 1 tablet by mouth daily.   pravastatin  40 MG tablet Commonly known as: PRAVACHOL  Take 1 tablet by mouth once daily       "

## 2024-04-10 ENCOUNTER — Encounter: Payer: Self-pay | Admitting: Family Medicine

## 2024-04-10 ENCOUNTER — Ambulatory Visit: Admitting: Family Medicine

## 2024-04-10 VITALS — BP 128/60 | HR 69 | Temp 98.6°F | Ht 71.5 in | Wt 209.5 lb

## 2024-04-10 DIAGNOSIS — E782 Mixed hyperlipidemia: Secondary | ICD-10-CM

## 2024-04-10 DIAGNOSIS — Z0000 Encounter for general adult medical examination without abnormal findings: Secondary | ICD-10-CM | POA: Diagnosis not present

## 2024-04-10 DIAGNOSIS — Z125 Encounter for screening for malignant neoplasm of prostate: Secondary | ICD-10-CM | POA: Diagnosis not present

## 2024-04-10 LAB — LIPID PANEL
Cholesterol: 171 mg/dL (ref 28–200)
HDL: 38.1 mg/dL — ABNORMAL LOW
LDL Cholesterol: 83 mg/dL (ref 10–99)
NonHDL: 132.61
Total CHOL/HDL Ratio: 4
Triglycerides: 247 mg/dL — ABNORMAL HIGH (ref 10.0–149.0)
VLDL: 49.4 mg/dL — ABNORMAL HIGH (ref 0.0–40.0)

## 2024-04-10 LAB — PSA, MEDICARE: PSA: 2.14 ng/mL (ref 0.10–4.00)

## 2024-04-10 NOTE — Patient Instructions (Addendum)
 RSV vaccine

## 2024-04-11 ENCOUNTER — Ambulatory Visit: Payer: Self-pay | Admitting: Family Medicine

## 2024-04-17 ENCOUNTER — Other Ambulatory Visit: Payer: Self-pay | Admitting: Family Medicine

## 2024-04-19 MED ORDER — ROSUVASTATIN CALCIUM 40 MG PO TABS: 40.0000 mg | ORAL_TABLET | Freq: Every day | ORAL | 3 refills | Status: AC

## 2024-04-19 NOTE — Telephone Encounter (Signed)
 Sent in Crestor  40 mg

## 2024-05-22 ENCOUNTER — Ambulatory Visit
